# Patient Record
Sex: Female | Born: 1970
Health system: Southern US, Community
[De-identification: ages and names within clinical notes are randomized; demographics above are authoritative.]

## PROBLEM LIST (undated history)

## (undated) DIAGNOSIS — I10 Essential (primary) hypertension: Secondary | ICD-10-CM

## (undated) DIAGNOSIS — G4730 Sleep apnea, unspecified: Secondary | ICD-10-CM

## (undated) DIAGNOSIS — G510 Bell's palsy: Secondary | ICD-10-CM

## (undated) DIAGNOSIS — J45909 Unspecified asthma, uncomplicated: Secondary | ICD-10-CM

## (undated) DIAGNOSIS — T783XXA Angioneurotic edema, initial encounter: Secondary | ICD-10-CM

## (undated) DIAGNOSIS — B009 Herpesviral infection, unspecified: Secondary | ICD-10-CM

## (undated) DIAGNOSIS — Z86018 Personal history of other benign neoplasm: Secondary | ICD-10-CM

## (undated) DIAGNOSIS — E119 Type 2 diabetes mellitus without complications: Secondary | ICD-10-CM

## (undated) DIAGNOSIS — R2689 Other abnormalities of gait and mobility: Secondary | ICD-10-CM

## (undated) DIAGNOSIS — D649 Anemia, unspecified: Secondary | ICD-10-CM

## (undated) DIAGNOSIS — R011 Cardiac murmur, unspecified: Secondary | ICD-10-CM

## (undated) DIAGNOSIS — L508 Other urticaria: Secondary | ICD-10-CM

## (undated) DIAGNOSIS — F449 Dissociative and conversion disorder, unspecified: Secondary | ICD-10-CM

## (undated) DIAGNOSIS — J302 Other seasonal allergic rhinitis: Secondary | ICD-10-CM

## (undated) HISTORY — DX: Unspecified asthma, uncomplicated: J45.909

## (undated) HISTORY — PX: TOTAL ABDOMINAL HYSTERECTOMY: SHX209

## (undated) HISTORY — PX: LAPAROSCOPIC ABDOMINAL EXPLORATION: SHX6249

## (undated) HISTORY — PX: BREAST CYST EXCISION: SHX579

## (undated) HISTORY — PX: CYSTECTOMY: SUR359

## (undated) HISTORY — DX: Herpesviral infection, unspecified: B00.9

## (undated) HISTORY — DX: Cardiac murmur, unspecified: R01.1

## (undated) HISTORY — DX: Other seasonal allergic rhinitis: J30.2

## (undated) HISTORY — DX: Dissociative and conversion disorder, unspecified: F44.9

## (undated) HISTORY — DX: Other abnormalities of gait and mobility: R26.89

## (undated) HISTORY — DX: Personal history of other benign neoplasm: Z86.018

---
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
## 2012-07-01 DIAGNOSIS — K2950 Unspecified chronic gastritis without bleeding: Secondary | ICD-10-CM | POA: Insufficient documentation

## 2013-01-12 DIAGNOSIS — J45909 Unspecified asthma, uncomplicated: Secondary | ICD-10-CM | POA: Insufficient documentation

## 2013-01-12 DIAGNOSIS — K219 Gastro-esophageal reflux disease without esophagitis: Secondary | ICD-10-CM | POA: Insufficient documentation

## 2016-08-30 DIAGNOSIS — R011 Cardiac murmur, unspecified: Secondary | ICD-10-CM | POA: Insufficient documentation

## 2017-05-26 ENCOUNTER — Encounter (HOSPITAL_COMMUNITY): Payer: Self-pay

## 2017-05-26 ENCOUNTER — Emergency Department (HOSPITAL_COMMUNITY)
Admission: EM | Admit: 2017-05-26 | Discharge: 2017-05-27 | Disposition: A | Discharge status: Home / Self Care | Payer: Self-pay | Attending: Emergency Medicine | Admitting: Emergency Medicine

## 2017-05-26 DIAGNOSIS — R519 Headache, unspecified: Secondary | ICD-10-CM

## 2017-05-26 DIAGNOSIS — I10 Essential (primary) hypertension: Secondary | ICD-10-CM | POA: Insufficient documentation

## 2017-05-26 DIAGNOSIS — R51 Headache: Secondary | ICD-10-CM

## 2017-05-26 DIAGNOSIS — E119 Type 2 diabetes mellitus without complications: Secondary | ICD-10-CM | POA: Insufficient documentation

## 2017-05-26 HISTORY — DX: Anemia, unspecified: D64.9

## 2017-05-26 HISTORY — DX: Essential (primary) hypertension: I10

## 2017-05-26 HISTORY — DX: Type 2 diabetes mellitus without complications: E11.9

## 2017-05-26 LAB — CBG MONITORING, ED: Glucose-Capillary: 137 mg/dL — ABNORMAL HIGH (ref 65–99)

## 2017-05-26 MED ORDER — DIPHENHYDRAMINE HCL 50 MG/ML IJ SOLN
25.0000 mg | Freq: Once | INTRAMUSCULAR | Status: AC
  Administered 2017-05-27: 25 mg via INTRAVENOUS
  Filled 2017-05-26: qty 1

## 2017-05-26 MED ORDER — KETOROLAC TROMETHAMINE 30 MG/ML IJ SOLN
30.0000 mg | Freq: Once | INTRAMUSCULAR | Status: AC
  Administered 2017-05-27: 30 mg via INTRAVENOUS
  Filled 2017-05-26: qty 1

## 2017-05-26 MED ORDER — PROCHLORPERAZINE EDISYLATE 5 MG/ML IJ SOLN
10.0000 mg | Freq: Once | INTRAMUSCULAR | Status: AC
  Administered 2017-05-27: 10 mg via INTRAVENOUS
  Filled 2017-05-26: qty 2

## 2017-05-26 MED ORDER — SODIUM CHLORIDE 0.9 % IV BOLUS (SEPSIS)
1000.0000 mL | Freq: Once | INTRAVENOUS | Status: AC
Start: 2017-05-26 — End: 2017-05-27
  Administered 2017-05-27: 1000 mL via INTRAVENOUS

## 2017-05-26 NOTE — ED Provider Notes (Signed)
[facility]-EMERGENCY DEPT Provider Note   CSN: [id number] Arrival date & time: 05/26/17  1752     History   Chief Complaint No chief complaint on file.   HPI Shelley Mccoy is a 46 y.o. female.  HPI  46 y.o. female with a hx of DM, HTN, presents to the Emergency Department today due to frontal headache this afternoon. Pt took BP at work and found to be 164/98. Pt came home from work due to ongoing headache. Decided to come to ED for further evaluation. Pt states that she took her BP medications (Losartan/HCTZ). Does note hx migraines. OTC meds with minimal relief. Rates pain 8/10. Frontal headache. Throbbing and constant all day. Similar headache in past. Gradual onset. No N/V. No numbness/tingling. No fevers. No sinus congestion/URI symptoms. No unilateral weakness. Does endorse photophobia. No other symptoms noted.   Past Medical History:  Diagnosis Date  . Anemia   . Diabetes mellitus without complication (HCC)   . Hypertension     There are no active problems to display for this patient.   History reviewed. No pertinent surgical history.  OB History    No data available       Home Medications    Prior to Admission medications   Not on File    Family History No family history on file.  Social History Social History  Substance Use Topics  . Smoking status: Never Smoker  . Smokeless tobacco: Never Used  . Alcohol use Not on file     Allergies   Percocet [oxycodone-acetaminophen]   Review of Systems Review of Systems ROS reviewed and all are negative for acute change except as noted in the HPI.  Physical Exam Updated Vital Signs BP (!) 144/83 (BP Location: Right Arm)   Pulse 68   Temp 97.6 F (36.4 C) (Oral)   Resp 18   SpO2 100%   Physical Exam  Constitutional: She is oriented to person, place, and time. She appears well-developed and well-nourished. No distress.  HENT:  Head: Normocephalic and atraumatic.  Right Ear: Tympanic membrane, external  ear and ear canal normal.  Left Ear: Tympanic membrane, external ear and ear canal normal.  Nose: Nose normal.  Mouth/Throat: Uvula is midline, oropharynx is clear and moist and mucous membranes are normal. No trismus in the jaw. No oropharyngeal exudate, posterior oropharyngeal erythema or tonsillar abscesses.  Eyes: Pupils are equal, round, and reactive to light. EOM are normal.  Neck: Normal range of motion. Neck supple. No tracheal deviation present.  Cardiovascular: Normal rate, regular rhythm, S1 normal, S2 normal, normal heart sounds, intact distal pulses and normal pulses.   Pulmonary/Chest: Effort normal and breath sounds normal. No respiratory distress. She has no decreased breath sounds. She has no wheezes. She has no rhonchi. She has no rales.  Abdominal: Normal appearance and bowel sounds are normal. There is no tenderness.  Musculoskeletal: Normal range of motion.  Neurological: She is alert and oriented to person, place, and time. She has normal strength. No cranial nerve deficit or sensory deficit.  Cranial Nerves:  II: Pupils equal, round, reactive to light III,IV, VI: ptosis not present, extra-ocular motions intact bilaterally  V,VII: smile symmetric, facial light touch sensation equal VIII: hearing grossly normal bilaterally  IX,X: midline uvula rise  XI: bilateral shoulder shrug equal and strong XII: midline tongue extension  Skin: Skin is warm and dry.  Psychiatric: She has a normal mood and affect. Her speech is normal and behavior is normal. Thought content normal.  Nursing note and vitals reviewed.    ED Treatments / Results  Labs (all labs ordered are listed, but only abnormal results are displayed) Labs Reviewed  CBG MONITORING, ED - Abnormal; Notable for the following:       Result Value   Glucose-Capillary 137 (*)    All other components within normal limits    EKG  EKG Interpretation None       Radiology No results  found.  Procedures Procedures (including critical care time)  Medications Ordered in ED Medications  magnesium sulfate IVPB 1 g 100 mL (not administered)  sodium chloride 0.9 % bolus 1,000 mL (1,000 mLs Intravenous New Bag/Given 05/27/17 0037)  prochlorperazine (COMPAZINE) injection 10 mg (10 mg Intravenous Given 05/27/17 0037)  diphenhydrAMINE (BENADRYL) injection 25 mg (25 mg Intravenous Given 05/27/17 0036)  ketorolac (TORADOL) 30 MG/ML injection 30 mg (30 mg Intravenous Given 05/27/17 0036)    Initial Impression / Assessment and Plan / ED Course  I have reviewed the triage vital signs and the nursing notes.  Pertinent labs & imaging results that were available during my care of the patient were reviewed by me and considered in my medical decision making (see chart for details).  Final Clinical Impressions(s) / ED Diagnoses  {I have reviewed and evaluated the relevant laboratory values.   {I have reviewed the relevant previous healthcare records.  {I obtained HPI from historian.   ED Course:  Assessment: Patient is a 46 y.o. female with a hx of DM, HTN, presents to the Emergency Department today due to frontal headache this afternoon. Pt took BP at work and found to be 164/98. Pt came home from work due to ongoing headache. Decided to come to ED for further evaluation. Pt states that she took her BP medications (Losartan/HCTZ). Does note hx migraines. OTC meds with minimal relief. Rates pain 8/10. Frontal headache. Throbbing and constant all day. Similar headache in past. Gradual onset. No N/V. No numbness/tingling. No fevers. No sinus congestion/URI symptoms. No unilateral weakness. Does endorse photophobia.. Patient is without high-risk features of headache including: Sudden onset/thunderclap HA, No similar headache in past, Altered mental status, Accompanying seizure, Headache with exertion, Age > 50, History of immunocompromise, Neck or shoulder pain, Fever, Use of anticoagulation, Family  history of spontaneous SAH, Concomitant drug use, Toxic exposure.  Patient has a normal complete neurological exam, normal vital signs, normal level of consciousness, no signs of meningismus, is well-appearing/non-toxic appearing, no signs of trauma. No papilledema, no pain over the temporal arteries. Imaging with CT/MRI not indicated given history and physical exam findings. No dangerous or life-threatening conditions suspected or identified by history, physical exam, and by work-up. No indications for hospitalization identified. Headache likely 2/2 HTN. Given migraine cocktail as well as magnesium with improvement. Discussed diet and exercise. Close follow up to PCP for further management.   Disposition/Plan:  DC Home Additional Verbal discharge instructions given and discussed with patient.  Pt Instructed to f/u with PCP in the next week for evaluation and treatment of symptoms. Return precautions given Pt acknowledges and agrees with plan  Supervising Physician Ward, Layla Maw, DO  Final diagnoses:  Hypertension, unspecified type  Nonintractable headache, unspecified chronicity pattern, unspecified headache type    New Prescriptions New Prescriptions   No medications on file     Audry Pili, PA-C 05/27/17 0114    Ward, Layla Maw, DO 05/27/17 [phone]

## 2017-05-26 NOTE — ED Notes (Signed)
Called x 3 no answer 

## 2017-05-26 NOTE — Discharge Instructions (Signed)
Please read and follow all provided instructions.  Your diagnoses today include:  1. Hypertension, unspecified type   2. Nonintractable headache, unspecified chronicity pattern, unspecified headache type     Tests performed today include: Vital signs. See below for your results today.   Medications:  In the Emergency Department you received: Reglan - antinausea/headache medication Benadryl - antihistamine to counteract potential side effects of reglan Toradol - NSAID medication similar to ibuprofen  Take any prescribed medications only as directed.  Additional information:  Follow any educational materials contained in this packet.  You are having a headache. No specific cause was found today for your headache. It may have been a migraine or other cause of headache. Stress, anxiety, fatigue, and depression are common triggers for headaches.   Your headache today does not appear to be life-threatening or require hospitalization, but often the exact cause of headaches is not determined in the emergency department. Therefore, follow-up with your doctor is very important to find out what may have caused your headache and whether or not you need any further diagnostic testing or treatment.   Sometimes headaches can appear benign (not harmful), but then more serious symptoms can develop which should prompt an immediate re-evaluation by your doctor or the emergency department.  BE VERY CAREFUL not to take multiple medicines containing Tylenol (also called acetaminophen). Doing so can lead to an overdose which can damage your liver and cause liver failure and possibly death.   Follow-up instructions: Please follow-up with your primary care provider in the next 3 days for further evaluation of your symptoms.   Return instructions:  Please return to the Emergency Department if you experience worsening symptoms. Return if the medications do not resolve your headache, if it recurs, or if you  have multiple episodes of vomiting or cannot keep down fluids. Return if you have a change from the usual headache. RETURN IMMEDIATELY IF you: Develop a sudden, severe headache Develop confusion or become poorly responsive or faint Develop a fever above 100.85F or problem breathing Have a change in speech, vision, swallowing, or understanding Develop new weakness, numbness, tingling, incoordination in your arms or legs Have a seizure Please return if you have any other emergent concerns.  Additional Information:  Your vital signs today were: BP (!) 144/83 (BP Location: Right Arm)    Pulse 68    Temp 97.6 F (36.4 C) (Oral)    Resp 18    SpO2 100%  If your blood pressure (BP) was elevated above 135/85 this visit, please have this repeated by your doctor within one month. --------------

## 2017-05-26 NOTE — ED Triage Notes (Signed)
Patient complains of frontal headache and took her BP at work and found it to be 164/98. Came home and rested and ongoing headache. BP normal at triage. Did take her meds this am. Alert and oriented, NAD

## 2017-05-26 NOTE — ED Notes (Signed)
CBG 137 reported to J. Blue-Matthews RN

## 2017-05-27 MED ORDER — MAGNESIUM SULFATE IN D5W 1-5 GM/100ML-% IV SOLN
1.0000 g | Freq: Once | INTRAVENOUS | Status: AC
  Administered 2017-05-27: 1 g via INTRAVENOUS
  Filled 2017-05-27: qty 100

## 2017-07-05 ENCOUNTER — Encounter: Payer: Self-pay | Admitting: Obstetrics and Gynecology

## 2017-07-05 ENCOUNTER — Ambulatory Visit (INDEPENDENT_AMBULATORY_CARE_PROVIDER_SITE_OTHER): Payer: 59 | Admitting: Obstetrics and Gynecology

## 2017-07-05 VITALS — BP 122/81 | HR 73 | Ht <= 58 in | Wt 208.0 lb

## 2017-07-05 DIAGNOSIS — R102 Pelvic and perineal pain: Secondary | ICD-10-CM | POA: Diagnosis not present

## 2017-07-05 DIAGNOSIS — Z01419 Encounter for gynecological examination (general) (routine) without abnormal findings: Secondary | ICD-10-CM | POA: Diagnosis not present

## 2017-07-05 DIAGNOSIS — Z1151 Encounter for screening for human papillomavirus (HPV): Secondary | ICD-10-CM | POA: Diagnosis not present

## 2017-07-05 DIAGNOSIS — Z124 Encounter for screening for malignant neoplasm of cervix: Secondary | ICD-10-CM | POA: Diagnosis not present

## 2017-07-05 DIAGNOSIS — Z113 Encounter for screening for infections with a predominantly sexual mode of transmission: Secondary | ICD-10-CM | POA: Diagnosis not present

## 2017-07-05 MED ORDER — ESTRADIOL 0.1 MG/GM VA CREA: TOPICAL_CREAM | VAGINAL | 12 refills | Status: DC

## 2017-07-05 NOTE — Progress Notes (Signed)
Pt presents for annual and all STD blood work today.

## 2017-07-05 NOTE — Progress Notes (Signed)
Subjective:     Shelley Mccoy is a 46 y.o. female with BMI 45 who is here for a comprehensive physical exam. The patient reports no problems. She is transferring her care from another GYN office as she feels she is not able to have her questions answered. She denies any episodes of vaginal bleeding. She reports vaginal spotting last December for which she underwent an exploratory laparoscopy with lysis of adhesions. She reports a normal pap smear yearly and is under the impression that her uterus, both ovaries and cervix were removed in the 1990's for the treatment of symptomatic fibroid uterus. The December 2017 op reports comments on a normal vaginal cuff with a normal cervix into which a Hulka manipulator was inserted. She is sexually active with some vaginal dryness. She denies any urinary incontinence. She denies any abnormal discharge. She reports occasional pelvic discomfort  Past Medical History:  Diagnosis Date  . Anemia   . Asthma   . Diabetes mellitus without complication (HCC)   . Heart murmur   . HSV-2 (herpes simplex virus 2) infection   . Hypertension   . Seasonal allergies    Past Surgical History:  Procedure Laterality Date  . CYSTECTOMY     hand and breast  . LAPAROSCOPIC ABDOMINAL EXPLORATION    . TOTAL ABDOMINAL HYSTERECTOMY     Family History  Problem Relation Age of Onset  . Diabetes Mother   . Congestive Heart Failure Mother   . Kidney failure Mother   . Hypertension Mother   . Prostate cancer Father   . Hypertension Sister   . Diabetes Maternal Grandmother   . Congestive Heart Failure Maternal Grandmother   . Kidney failure Maternal Grandmother   . Alzheimer's disease Paternal Grandmother     Social History   Social History  . Marital status: Single    Spouse name: N/A  . Number of children: N/A  . Years of education: N/A   Occupational History  . Not on file.   Social History Main Topics  . Smoking status: Never Smoker  . Smokeless tobacco:  Never Used  . Alcohol use No  . Drug use: No  . Sexual activity: Not on file   Other Topics Concern  . Not on file   Social History Narrative  . No narrative on file   Health Maintenance  Topic Date Due  . HIV Screening  05/07/1984  . TETANUS/TDAP  05/07/1988  . PAP SMEAR  05/07/1990  . INFLUENZA VACCINE  04/28/2017       Review of Systems Pertinent items are noted in HPI.   Objective:  Blood pressure 122/81, pulse 73, height  (1.448 m), weight 208 lb (94.3 kg).     GENERAL: Well-developed, well-nourished female in no acute distress.  HEENT: Normocephalic, atraumatic. Sclerae anicteric.  NECK: Supple. Normal thyroid.  LUNGS: Clear to auscultation bilaterally.  HEART: Regular rate and rhythm. BREASTS: Symmetric in size. No palpable masses or lymphadenopathy, skin changes, or nipple drainage. ABDOMEN: Soft, nontender, nondistended. No organomegaly. PELVIC: Normal external female genitalia. Vagina is pink and rugated.  Normal discharge. Normal vaginal cuff. No adnexal mass or tenderness. EXTREMITIES: No cyanosis, clubbing, or edema, 2+ distal pulses.    Assessment:    Healthy female exam.      Plan:      Patient with normal mammogram on 10/20/2016 Advised patient to perform monthly self breast and vulva exam Vaginal pap smear collected per patient request Fasting labs obtained Pelvic ultrasound ordered to assess  the presence of any GYN organs and evaluate the pelvic discomfort. Discussed with patient that if ultrasound is normal, it could be related to pelvic adhesion given her history of significant pelvic adhesion noted on 08/2016 operative report Rx estrace cream provided. A sample was given at today's visit. Patient also advised to use water based lubricant during intercourse RTC prn See After Visit Summary for Counseling Recommendations

## 2017-07-06 LAB — COMPREHENSIVE METABOLIC PANEL
ALT: 36 IU/L — ABNORMAL HIGH (ref 0–32)
AST: 25 IU/L (ref 0–40)
Albumin/Globulin Ratio: 1.4 (ref 1.2–2.2)
Albumin: 4.1 g/dL (ref 3.5–5.5)
Alkaline Phosphatase: 126 IU/L — ABNORMAL HIGH (ref 39–117)
BUN/Creatinine Ratio: 17 (ref 9–23)
BUN: 13 mg/dL (ref 6–24)
Bilirubin Total: 0.8 mg/dL (ref 0.0–1.2)
CO2: 25 mmol/L (ref 20–29)
Calcium: 9.5 mg/dL (ref 8.7–10.2)
Chloride: 102 mmol/L (ref 96–106)
Creatinine, Ser: 0.75 mg/dL (ref 0.57–1.00)
GFR calc Af Amer: 109 mL/min/{1.73_m2} (ref >59)
GFR calc non Af Amer: 95 mL/min/{1.73_m2} (ref >59)
Globulin, Total: 3 g/dL (ref 1.5–4.5)
Glucose: 99 mg/dL (ref 65–99)
Potassium: 4.1 mmol/L (ref 3.5–5.2)
Sodium: 144 mmol/L (ref 134–144)
Total Protein: 7.1 g/dL (ref 6.0–8.5)

## 2017-07-06 LAB — CBC
Hematocrit: 37.7 % (ref 34.0–46.6)
Hemoglobin: 12.2 g/dL (ref 11.1–15.9)
MCH: 25.2 pg — ABNORMAL LOW (ref 26.6–33.0)
MCHC: 32.4 g/dL (ref 31.5–35.7)
MCV: 78 fL — ABNORMAL LOW (ref 79–97)
Platelets: 262 10*3/uL (ref 150–379)
RBC: 4.85 x10E6/uL (ref 3.77–5.28)
RDW: 16.5 % — ABNORMAL HIGH (ref 12.3–15.4)
WBC: 4.2 10*3/uL (ref 3.4–10.8)

## 2017-07-06 LAB — CYTOLOGY - PAP
Chlamydia: NEGATIVE
Diagnosis: NEGATIVE
HPV: NOT DETECTED
Neisseria Gonorrhea: NEGATIVE

## 2017-07-06 LAB — LIPID PANEL
Chol/HDL Ratio: 2.8 ratio (ref 0.0–4.4)
Cholesterol, Total: 152 mg/dL (ref 100–199)
HDL: 55 mg/dL (ref >39)
LDL Calculated: 82 mg/dL (ref 0–99)
Triglycerides: 76 mg/dL (ref 0–149)
VLDL Cholesterol Cal: 15 mg/dL (ref 5–40)

## 2017-07-06 LAB — HEMOGLOBIN A1C
Est. average glucose Bld gHb Est-mCnc: 131 mg/dL
Hgb A1c MFr Bld: 6.2 % — ABNORMAL HIGH (ref 4.8–5.6)

## 2017-07-06 LAB — TSH: TSH: 1.39 u[IU]/mL (ref 0.450–4.500)

## 2017-07-09 ENCOUNTER — Other Ambulatory Visit: Payer: 59

## 2017-07-15 ENCOUNTER — Other Ambulatory Visit: Payer: Self-pay | Admitting: Obstetrics and Gynecology

## 2017-07-15 ENCOUNTER — Other Ambulatory Visit: Payer: 59

## 2017-07-15 ENCOUNTER — Ambulatory Visit
Admission: RE | Admit: 2017-07-15 | Discharge: 2017-07-15 | Disposition: A | Discharge status: Home / Self Care | Payer: 59 | Source: Ambulatory Visit | Attending: Obstetrics and Gynecology | Admitting: Obstetrics and Gynecology

## 2017-07-15 DIAGNOSIS — Z01419 Encounter for gynecological examination (general) (routine) without abnormal findings: Secondary | ICD-10-CM

## 2017-07-15 DIAGNOSIS — R102 Pelvic and perineal pain: Secondary | ICD-10-CM

## 2017-07-19 ENCOUNTER — Other Ambulatory Visit: Payer: 59

## 2017-07-19 DIAGNOSIS — Z113 Encounter for screening for infections with a predominantly sexual mode of transmission: Secondary | ICD-10-CM

## 2017-07-20 ENCOUNTER — Telehealth: Payer: Self-pay

## 2017-07-20 LAB — HIV ANTIBODY (ROUTINE TESTING W REFLEX): HIV Screen 4th Generation wRfx: NONREACTIVE

## 2017-07-20 NOTE — Telephone Encounter (Signed)
-----   Message from Catalina AntiguaPeggy Constant, MD sent at 07/15/2017  3:35 PM EDT ----- Please inform patient that ultrasound does not demonstrate the presence of a uterus, cervix or ovaries.   My exam also did not reveal the presence of a cervix. The vaginal pap smear done on her past visit was normal. She does not need any more pap smear. She should continue to have an annual physical exam with mammogram  Thanks  Gigi GinPeggy

## 2017-07-20 NOTE — Telephone Encounter (Signed)
Pt aware the u/s does not demonstrate the presence of a UT, cx, or ovaries. Dr. Jolayne Pantheronstant did not see a cx. The vag pap smear completed on her past visit was normal and she does not need any more pap smears. However, pt should continue to have yearly annual exams and mgm. Pt agrees.

## 2017-09-16 ENCOUNTER — Encounter: Payer: Self-pay | Admitting: Obstetrics and Gynecology

## 2017-11-07 ENCOUNTER — Encounter (HOSPITAL_COMMUNITY): Payer: Self-pay

## 2017-11-07 ENCOUNTER — Inpatient Hospital Stay (HOSPITAL_COMMUNITY): Payer: Self-pay

## 2017-11-07 ENCOUNTER — Emergency Department (HOSPITAL_COMMUNITY): Payer: Self-pay

## 2017-11-07 ENCOUNTER — Inpatient Hospital Stay (HOSPITAL_COMMUNITY)
Admission: EM | Admit: 2017-11-07 | Discharge: 2017-11-09 | DRG: 880 | Disposition: A | Discharge status: Home / Self Care | Payer: Self-pay | Attending: Neurology | Admitting: Neurology

## 2017-11-07 ENCOUNTER — Other Ambulatory Visit: Payer: Self-pay

## 2017-11-07 DIAGNOSIS — E669 Obesity, unspecified: Secondary | ICD-10-CM | POA: Diagnosis present

## 2017-11-07 DIAGNOSIS — R471 Dysarthria and anarthria: Secondary | ICD-10-CM | POA: Diagnosis present

## 2017-11-07 DIAGNOSIS — R29810 Facial weakness: Secondary | ICD-10-CM | POA: Diagnosis present

## 2017-11-07 DIAGNOSIS — E785 Hyperlipidemia, unspecified: Secondary | ICD-10-CM | POA: Diagnosis present

## 2017-11-07 DIAGNOSIS — F444 Conversion disorder with motor symptom or deficit: Principal | ICD-10-CM | POA: Diagnosis present

## 2017-11-07 DIAGNOSIS — Z841 Family history of disorders of kidney and ureter: Secondary | ICD-10-CM

## 2017-11-07 DIAGNOSIS — D649 Anemia, unspecified: Secondary | ICD-10-CM | POA: Diagnosis present

## 2017-11-07 DIAGNOSIS — Z833 Family history of diabetes mellitus: Secondary | ICD-10-CM

## 2017-11-07 DIAGNOSIS — R011 Cardiac murmur, unspecified: Secondary | ICD-10-CM | POA: Diagnosis present

## 2017-11-07 DIAGNOSIS — Z7984 Long term (current) use of oral hypoglycemic drugs: Secondary | ICD-10-CM

## 2017-11-07 DIAGNOSIS — F419 Anxiety disorder, unspecified: Secondary | ICD-10-CM | POA: Diagnosis present

## 2017-11-07 DIAGNOSIS — Z885 Allergy status to narcotic agent status: Secondary | ICD-10-CM

## 2017-11-07 DIAGNOSIS — R2990 Unspecified symptoms and signs involving the nervous system: Secondary | ICD-10-CM

## 2017-11-07 DIAGNOSIS — Z8249 Family history of ischemic heart disease and other diseases of the circulatory system: Secondary | ICD-10-CM

## 2017-11-07 DIAGNOSIS — I10 Essential (primary) hypertension: Secondary | ICD-10-CM | POA: Diagnosis present

## 2017-11-07 DIAGNOSIS — E1151 Type 2 diabetes mellitus with diabetic peripheral angiopathy without gangrene: Secondary | ICD-10-CM | POA: Diagnosis present

## 2017-11-07 DIAGNOSIS — R29708 NIHSS score 8: Secondary | ICD-10-CM | POA: Diagnosis present

## 2017-11-07 DIAGNOSIS — Z6841 Body Mass Index (BMI) 40.0 and over, adult: Secondary | ICD-10-CM

## 2017-11-07 DIAGNOSIS — R4701 Aphasia: Secondary | ICD-10-CM | POA: Diagnosis present

## 2017-11-07 DIAGNOSIS — R29703 NIHSS score 3: Secondary | ICD-10-CM | POA: Diagnosis not present

## 2017-11-07 DIAGNOSIS — Z820 Family history of epilepsy and other diseases of the nervous system: Secondary | ICD-10-CM

## 2017-11-07 DIAGNOSIS — J45909 Unspecified asthma, uncomplicated: Secondary | ICD-10-CM | POA: Diagnosis present

## 2017-11-07 DIAGNOSIS — Z79899 Other long term (current) drug therapy: Secondary | ICD-10-CM

## 2017-11-07 DIAGNOSIS — I639 Cerebral infarction, unspecified: Secondary | ICD-10-CM | POA: Diagnosis present

## 2017-11-07 DIAGNOSIS — Z7951 Long term (current) use of inhaled steroids: Secondary | ICD-10-CM

## 2017-11-07 DIAGNOSIS — Z90710 Acquired absence of both cervix and uterus: Secondary | ICD-10-CM

## 2017-11-07 LAB — APTT: aPTT: 29 seconds (ref 24–36)

## 2017-11-07 LAB — I-STAT TROPONIN, ED: Troponin i, poc: 0 ng/mL (ref 0.00–0.08)

## 2017-11-07 LAB — COMPREHENSIVE METABOLIC PANEL
ALT: 29 U/L (ref 14–54)
AST: 24 U/L (ref 15–41)
Albumin: 4 g/dL (ref 3.5–5.0)
Alkaline Phosphatase: 103 U/L (ref 38–126)
Anion gap: 5 (ref 5–15)
BUN: 8 mg/dL (ref 6–20)
CO2: 28 mmol/L (ref 22–32)
Calcium: 8.9 mg/dL (ref 8.9–10.3)
Chloride: 109 mmol/L (ref 101–111)
Creatinine, Ser: 0.71 mg/dL (ref 0.44–1.00)
GFR calc Af Amer: >60 mL/min (ref >60)
GFR calc non Af Amer: >60 mL/min (ref >60)
Glucose, Bld: 67 mg/dL (ref 65–99)
Potassium: 3.4 mmol/L — ABNORMAL LOW (ref 3.5–5.1)
Sodium: 142 mmol/L (ref 135–145)
Total Bilirubin: 0.9 mg/dL (ref 0.3–1.2)
Total Protein: 7.6 g/dL (ref 6.5–8.1)

## 2017-11-07 LAB — DIFFERENTIAL
Basophils Absolute: 0 10*3/uL (ref 0.0–0.1)
Basophils Relative: 0 %
Eosinophils Absolute: 0.1 10*3/uL (ref 0.0–0.7)
Eosinophils Relative: 2 %
Lymphocytes Relative: 46 %
Lymphs Abs: 3 10*3/uL (ref 0.7–4.0)
Monocytes Absolute: 0.3 10*3/uL (ref 0.1–1.0)
Monocytes Relative: 5 %
Neutro Abs: 3.1 10*3/uL (ref 1.7–7.7)
Neutrophils Relative %: 47 %

## 2017-11-07 LAB — I-STAT CHEM 8, ED
BUN: 6 mg/dL (ref 6–20)
Calcium, Ion: 1.16 mmol/L (ref 1.15–1.40)
Chloride: 107 mmol/L (ref 101–111)
Creatinine, Ser: 0.7 mg/dL (ref 0.44–1.00)
Glucose, Bld: 63 mg/dL — ABNORMAL LOW (ref 65–99)
HCT: 35 % — ABNORMAL LOW (ref 36.0–46.0)
Hemoglobin: 11.9 g/dL — ABNORMAL LOW (ref 12.0–15.0)
Potassium: 3.3 mmol/L — ABNORMAL LOW (ref 3.5–5.1)
Sodium: 145 mmol/L (ref 135–145)
TCO2: 29 mmol/L (ref 22–32)

## 2017-11-07 LAB — PROTIME-INR
INR: 1
Prothrombin Time: 13.1 seconds (ref 11.4–15.2)

## 2017-11-07 LAB — GLUCOSE, CAPILLARY
Glucose-Capillary: 68 mg/dL (ref 65–99)
Glucose-Capillary: 103 mg/dL — ABNORMAL HIGH (ref 65–99)

## 2017-11-07 LAB — CBC
HCT: 34.6 % — ABNORMAL LOW (ref 36.0–46.0)
Hemoglobin: 11.6 g/dL — ABNORMAL LOW (ref 12.0–15.0)
MCH: 26 pg (ref 26.0–34.0)
MCHC: 33.5 g/dL (ref 30.0–36.0)
MCV: 77.6 fL — ABNORMAL LOW (ref 78.0–100.0)
Platelets: 290 10*3/uL (ref 150–400)
RBC: 4.46 MIL/uL (ref 3.87–5.11)
RDW: 15.3 % (ref 11.5–15.5)
WBC: 6.6 10*3/uL (ref 4.0–10.5)

## 2017-11-07 LAB — MRSA PCR SCREENING: MRSA by PCR: NEGATIVE

## 2017-11-07 LAB — I-STAT BETA HCG BLOOD, ED (MC, WL, AP ONLY): I-stat hCG, quantitative: <5 m[IU]/mL (ref <5)

## 2017-11-07 MED ORDER — DEXTROSE 50 % IV SOLN
INTRAVENOUS | Status: AC
  Administered 2017-11-07 – 2017-11-08 (×2): 25 mL
  Filled 2017-11-07: qty 50

## 2017-11-07 MED ORDER — SODIUM CHLORIDE 0.9 % IV SOLN
50.0000 mL/h | INTRAVENOUS | Status: DC
  Administered 2017-11-07: 50 mL/h via INTRAVENOUS

## 2017-11-07 MED ORDER — LABETALOL HCL 5 MG/ML IV SOLN: 10.0000 mg | Freq: Once | INTRAVENOUS | Status: DC | PRN

## 2017-11-07 MED ORDER — STROKE: EARLY STAGES OF RECOVERY BOOK
Freq: Once | Status: AC
  Administered 2017-11-07: 22:00:00
  Filled 2017-11-07 (×2): qty 1

## 2017-11-07 MED ORDER — NICARDIPINE HCL IN NACL 20-0.86 MG/200ML-% IV SOLN: 0.0000 mg/h | INTRAVENOUS | Status: DC

## 2017-11-07 MED ORDER — IOPAMIDOL (ISOVUE-370) INJECTION 76%
INTRAVENOUS | Status: AC
  Administered 2017-11-07: 100 mL
  Filled 2017-11-07: qty 100

## 2017-11-07 MED ORDER — PANTOPRAZOLE SODIUM 40 MG IV SOLR
40.0000 mg | Freq: Every day | INTRAVENOUS | Status: DC
  Administered 2017-11-07: 40 mg via INTRAVENOUS
  Filled 2017-11-07: qty 40

## 2017-11-07 MED ORDER — SODIUM CHLORIDE 0.9 % IJ SOLN
INTRAMUSCULAR | Status: AC
  Filled 2017-11-07: qty 50

## 2017-11-07 MED ORDER — ALTEPLASE (STROKE) FULL DOSE INFUSION
0.9000 mg/kg | Freq: Once | INTRAVENOUS | Status: AC
  Administered 2017-11-07: 86 mg via INTRAVENOUS
  Filled 2017-11-07: qty 100

## 2017-11-07 MED ORDER — SODIUM CHLORIDE 0.9 % IV SOLN: 50.0000 mL | Freq: Once | INTRAVENOUS | Status: AC

## 2017-11-07 MED ORDER — NICARDIPINE HCL IN NACL 20-0.86 MG/200ML-% IV SOLN: 0.0000 mg/h | INTRAVENOUS | Status: DC | PRN

## 2017-11-07 MED ORDER — LABETALOL HCL 5 MG/ML IV SOLN: 20.0000 mg | Freq: Once | INTRAVENOUS | Status: DC

## 2017-11-07 MED ORDER — METOCLOPRAMIDE HCL 5 MG/ML IJ SOLN
10.0000 mg | Freq: Once | INTRAMUSCULAR | Status: AC
  Administered 2017-11-07: 10 mg via INTRAVENOUS
  Filled 2017-11-07: qty 2

## 2017-11-07 NOTE — Consult Note (Addendum)
TeleNeurology Consult Note  Corinna Lines     Consulting Physician: Clabe Seal Code Status: No Order Primary Care Physician:  Patient, No Pcp Per  DOB:  02/19/1971   Age: 47 y.o.  Date of Service: November 07, 2017 Admit Date:  11/07/2017   Admitting Diagnosis: <principal problem not specified>  Subjective:  Reason for Consultation: STROKE  Chief Complaint: stuttering  History of present illness: 47 y/o woman presents with stuttering. Emergent telestroke consult requested. CT head reviewed and case discussed with ED staff. Patient able to write and follows commands. Case discussed with bedside local neurologist who also evaluated the patient at bedside.    Verbal Consent to tPA by her husband at 1444 I have explained to the patient/family the nature of the patient's condition, the use of tPA fibrinolytic agent, and the benefits to be reasonably expected compared with alternative approaches. I have discussed the likelihood of major risks or complications of this procedure including (if applicable) but not limited to loss of limb function, brain damage, paralysis, hemorrhage, infection, complications from transfusion of blood components, drug reactions, blood clots and loss of life. I have also indicated that with any procedure there is always the possibility of an unexpected complication. I have explained the risks which include:      Death, Stroke or permanent neurologic injury (paralysis, coma, etc). I have discussed the likelihood of major risks or complications of this procedure including (if applicable) but not limited to loss of limb function, brain damage, paralysis, hemorrhage, infection, complications from transfusion of blood components, drug reactions, blood clots and loss of life. I have also indicated that with any procedure there is always the possibility of an unexpected complication. I have the explain the risks which include:      Worsening of stroke symptoms from swelling  or bleeding in the brain      Bleeding in other parts of the body      Need for blood transfusions to replace blood or clotting factors      Allergic reaction to medications      Other unexpected complications      All questions were answered and the patient or family expressed understanding of the treatment plan and consent to the procedure.    Review of Systems  There are no active problems to display for this patient.  Past Medical History:  Diagnosis Date  . Anemia   . Asthma   . Diabetes mellitus without complication ([facility])   . Heart murmur   . History of uterine fibroid   . HSV-2 (herpes simplex virus 2) infection   . Hypertension   . Seasonal allergies    Past Surgical History:  Procedure Laterality Date  . CYSTECTOMY     hand and breast  . LAPAROSCOPIC ABDOMINAL EXPLORATION    . TOTAL ABDOMINAL HYSTERECTOMY     Allergies  Allergen Reactions  . Percocet [Oxycodone-Acetaminophen] Hives    Social History   Socioeconomic History  . Marital status: Single    Spouse name: Not on file  . Number of children: Not on file  . Years of education: Not on file  . Highest education level: Not on file  Social Needs  . Financial resource strain: Not on file  . Food insecurity - worry: Not on file  . Food insecurity - inability: Not on file  . Transportation needs - medical: Not on file  . Transportation needs - non-medical: Not on file  Occupational History  . Not on  file  Tobacco Use  . Smoking status: Never Smoker  . Smokeless tobacco: Never Used  Substance and Sexual Activity  . Alcohol use: No  . Drug use: No  . Sexual activity: Not on file  Other Topics Concern  . Not on file  Social History Narrative  . Not on file   Family History Family History  Problem Relation Age of Onset  . Diabetes Mother   . Congestive Heart Failure Mother   . Kidney failure Mother   . Hypertension Mother   . Prostate cancer Father   . Hypertension Sister   . Diabetes  Maternal Grandmother   . Congestive Heart Failure Maternal Grandmother   . Kidney failure Maternal Grandmother   . Alzheimer's disease Paternal Grandmother       Objective:  Vital signs in last 24 hours: Temp:  [98 F (36.7 C)] 98 F (36.7 C) (02/10 1417) Pulse Rate:  [89] 89 (02/10 1417) Resp:  [22] 22 (02/10 1417) BP: (154)/(84) 154/84 (02/10 1417) SpO2:  [100 %] 100 % (02/10 1417) Weight:  [95.4 kg (210 lb 4 oz)] 95.4 kg (210 lb 4 oz) (02/10 1428) Temp (24hrs), Avg:98 F (36.7 C), Min:98 F (36.7 C), Max:98 F (36.7 C)    Intake/Output last 3 shifts: No intake/output data recorded.  Intake/Output this shift: No intake/output data recorded.  Physical Exam 1a- LOC: Keenly responsive - 0      1b- LOC questions: Answers both questions incorrectly - 2   1c- LOC commands- Performs both tasks correctly- 0     2- Gaze: Normal; no gaze paresis or gaze deviation - 0     3- Visual Fields: normal, no Visual field deficit - 0     4- Facial movements: no facial palsy - 0     5a- Right Upper limb motor - no drift -0     5b -Left Upper limb motor - no drift -0     6a- Right Lower limb motor - no drift - 0      6b- Left Lower limb motor - no drift - 0 7- Limb Coordination: absent ataxia - 0      8- Sensory : no sensory loss - 0      9- Language - Mute - 3    10- Speech - [place] -3     11- Neglect / Extinction - none found -0     NIHSS score   8   Diagnostic Findings:  Pertinent Labs: No results for input(s): WBC, MCV in the last 168 hours.  Invalid input(s): HEMATOCRIT, HEMOGLOBIN, PLATELETS No results for input(s): POTASSIUM, CHLORIDE, CALCIUM, BUN, GLUCOSE, CREATININE, CO2 in the last 168 hours.  Invalid input(s): SODIUM, ANION GAP2, GFR MDRD AF AMER No results for input(s): AST, ALT, ALBUMIN in the last 168 hours.  Invalid input(s): ALK PHOS, BILIRUBIN TOTAL, BILIRUBIN DIRECT, PROTEIN TOTAL, GLOBULIN No results for input(s): TRIG in the last 168 hours.  Invalid  input(s): CHLPL, HDL PML, VLDL CALC, LDL CALC, CHOL/HDL RATIO, LDL/HDL RATIO, NON-HDL CHOLESTEROL Invalid input(s): CK TOTAL, TROPONIN I, CK MB No results for input(s): APTT in the last 168 hours. No results for input(s): INR in the last 168 hours.  Invalid input(s): PROTHROMBIN TIME  Extensive review of previous medical records completed.    Assessment: There are no active problems to display for this patient.    Plan: [facility] Tele[facility]urology Consult Services  Impression: Stroke     Differential Diagnosis:  1. Cardioembolic stroke  2. Small  vessel disease/lacune    3. Thromboembolic, artery-to-artery mechanism 4. Hypercoagulable state-related infarct  5. Thrombotic mechanism, large artery disease 6. Transient ischemic attack 7. Conversion disorder  Comments: Last known well:   1200 Door time:1401 [facility] contacted:   1430 [facility] at bedside:   1436 NIHSS assessment time:1436 TPA ordered at 1446 Needle time:1501     Discussion:     Our recommendations are outlined below.  Recommendations: Check CTA head/neck for possible thrombectomy (perfusion imaging not available) Admit to ICU s/p TPA for neurochecks, BP control and repeat CT head in 24 hours Neurochecks DVT prophylaxis    Follow up with Neurology for further testing and evaluation  Medical Decision Making:  - Extensive number of diagnosis or management options are considered above. - Extensive amount of complex data reviewed.  - High risk of complication and/or morbidity or mortality are associated with differential diagnostic considerations above. - There may be uncertain outcome and increased probability of prolonged functional impairment or high probability of severe prolonged functional impairment associated with some of these differential diagnosis.  Medical Data Reviewed: 1.Data reviewed include clinical labs, radiology, Medical Tests; 2.Tests results discussed w/performing or  interpreting physician;    3.Obtaining/reviewing old medical records; 4.Obtaining case history from another source;    5.Independent review of image, tracing or specimen.  Signed: 11/07/2017  2:39 PM   Addendum 1550  No large vessel intracranial occlusion as per my review. Not a thrombctomy candidate at this time. Case discussed with ED staff.

## 2017-11-07 NOTE — Progress Notes (Addendum)
   11/07/17 1615  T-PA Assessment Vital Signs  Pulse Rate 85  ECG Heart Rate 85  BP (!) 120/96  SpO2 99 %  Modified NIH Stroke Scale  Interval Shift assessment  Level of Consciousness (1a.)    0  LOC Questions (1b. )   + 0  LOC Commands (1c. )   +  0  Best Gaze (2. )  + 0  Visual (3. )  + 0  Facial Palsy (4. )     1 (right)  Motor Arm, Left (5a. )   + 0  Motor Arm, Right (5b. )   + 0  Motor Leg, Left (6a. )   + 0  Motor Leg, Right (6b. )   + 3  Limb Ataxia (7. ) 0  Sensory (8. )   + 1  Best Language (9. )   + 2  Dysarthria (10. ) 2  Extinction/Inattention (11.)   + 0  Total 9    Pt arrived to 4N31 at 1614.  Pt A&O x 4 (through written communication only-pt presents with persistent stutter and unintelligible speech, although clearly able to understand speech and follow commands) , c/o mild headache, Pt mildly anxious, Family at the bedside.  MD at bedside.

## 2017-11-07 NOTE — Progress Notes (Signed)
Pt now able to speak, and reporting that she has a history of Bell's palsy and this is how she presented previously as well.  Information passed along to night shift.

## 2017-11-07 NOTE — ED Notes (Addendum)
[facility] arrived  

## 2017-11-07 NOTE — ED Notes (Signed)
Pharmacy has been contacted and they are tubing up TpA ASAP.

## 2017-11-07 NOTE — Progress Notes (Signed)
Pharmacist Code Stroke Response  Notified to mix tPA at 14:48 by RN Barham Delivered tPA to RN at 14:56  Issues/delays encountered (if applicable):   Per epic note RN states code stroke called at 14:25 RN called pharmacy giving weight and height at 14:48 ED secretary called pharmacy at 14:48 stating needing tPA in [facility] Code stroke pager went off at 14:59   Arley Phenixllen Britt Petroni RPh 11/07/2017, 3:30 PM Pager [phone]

## 2017-11-07 NOTE — ED Triage Notes (Signed)
Pt presents with stuttering, R sided facial droop and aphasia. Code stroke called. MD made aware. Headed to CT.

## 2017-11-07 NOTE — ED Notes (Signed)
[facility] left with patient 

## 2017-11-07 NOTE — H&P (Signed)
Neurology H&P  CC: Speech abnormality  History is obtained from: Patient  HPI: Shelley Mccoy is a 47 y.o. female with a history of diabetes, hypertension who presents with speech abnormality that started noon.  He apparently worked this morning, then began having difficulty speaking and moving right side of her face.  She was evaluated by tele-neurology who felt that she was a candidate for IV TPA and therefore started it.   LKW: Noon tpa given?: yes Modified Rankin Scale: 0-Completely asymptomatic and back to baseline post- stroke  ROS:  Unable to obtain due to altered mental status.   Past Medical History:  Diagnosis Date  . Anemia   . Asthma   . Diabetes mellitus without complication (HCC)   . Heart murmur   . History of uterine fibroid   . HSV-2 (herpes simplex virus 2) infection   . Hypertension   . Seasonal allergies      Family History  Problem Relation Age of Onset  . Diabetes Mother   . Congestive Heart Failure Mother   . Kidney failure Mother   . Hypertension Mother   . Prostate cancer Father   . Hypertension Sister   . Diabetes Maternal Grandmother   . Congestive Heart Failure Maternal Grandmother   . Kidney failure Maternal Grandmother   . Alzheimer's disease Paternal Grandmother      Social History:  reports that  has never smoked. she has never used smokeless tobacco. She reports that she does not drink alcohol or use drugs.   Exam: Current vital signs: BP 133/87   Pulse 78   Temp 98 F (36.7 C) (Oral)   Resp (!) 22   Ht 5' (1.524 m)   Wt 95.4 kg (210 lb 4 oz)   SpO2 100%   BMI 41.06 kg/m  Vital signs in last 24 hours: Temp:  [98 F (36.7 C)] 98 F (36.7 C) (02/10 1417) Pulse Rate:  [71-95] 78 (02/10 1900) Resp:  [7-36] 22 (02/10 1900) BP: (120-167)/(74-96) 133/87 (02/10 1900) SpO2:  [95 %-100 %] 100 % (02/10 1900) Weight:  [95.4 kg (210 lb 4 oz)] 95.4 kg (210 lb 4 oz) (02/10 1428)  Physical Exam  Constitutional: Appears  well-developed and well-nourished.  Psych: Affect appropriate to situation Eyes: No scleral injection HENT: No OP obstrucion Head: Normocephalic.  Cardiovascular: Normal rate and regular rhythm.  Respiratory: Effort normal and breath sounds normal to anterior ascultation GI: Soft.  No distension. There is no tenderness.  Skin: WDI  Neuro: Mental Status: Patient is awake, alert, she has an abnormal speech pattern of stuttering, when I asked her to calm down and slowly repeat words, she does repeat one word at a time.  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 Cranial Nerves: II: Visual Fields are full. Pupils are equal, round, and reactive to light.   III,IV, VI: EOMI without ptosis or diploplia.  V: Facial sensation is symmetric to temperature VII: She holds her right face flexed with her lips pursed towards the direction of her face that she will not move. VIII: hearing is intact to voice X: Uvula elevates symmetrically XI: Shoulder shrug is symmetric. XII: tongue is midline without atrophy or fasciculations.  Motor: Tone is normal. Bulk is normal. 5/5 strength was present in all four extremities.  Sensory: Sensation is symmetric to light touch and temperature in the arms and legs. Cerebellar: FNF and HKS are intact bilaterally  I have reviewed labs in epic and the results pertinent to this consultation are: CMP-unremarkable  I  have reviewed the images obtained: CT/CTA-negative  Primary Diagnosis:  Aphasia   Secondary Diagnosis: Type 2 diabetes mellitus w/o complications   Impression: 47 year old female with50 multiple stroke risk factors who presents with new onset difficulty speaking.  In the setting IV TPA was started which is very reasonable, however I do have some suspicion that may be conversion disorder.  Recommendations: 1. HgbA1c, fasting lipid panel 2. MRI  of the brain without contrast 3. Frequent neuro checks 4. Echocardiogram 5. Carotid dopplers are not needed  given CTA 6. Prophylactic therapy-none for 24 hours 7. Risk factor modification 8. Telemetry monitoring 9. PT consult, OT consult, Speech consult 10. please page stroke NP  Or  PA  Or MD  from 8am -4 pm as this patient will be followed by the stroke team at this point.   You can look them up on [email or web address]     Ritta SlotMcNeill Karsynn Deweese, MD [facility] [phone]  If 7pm- 7am, please page neurology on call as listed in AMION. 11/07/2017  7:42 PM

## 2017-11-07 NOTE — ED Provider Notes (Signed)
[facility]-EMERGENCY DEPT Provider Note   CSN: [id number] Arrival date & time: 11/07/17  1401     History   Chief Complaint Chief Complaint  Patient presents with  . Stroke Symptoms    HPI Shelley Mccoy is a 47 y.o. female.  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  This occurred near noon.  Her husband brought her to the emergency department okay neuro bed sounds good need ICU if it that way   The history is provided by a relative. No language interpreter was used.  Altered Mental Status   This is a new problem. The current episode started 1 to 2 hours ago. The problem has not changed since onset.Associated symptoms include confusion. Risk factors: Unknown. Her past medical history does not include seizures.    Past Medical History:  Diagnosis Date  . Anemia   . Asthma   . Diabetes mellitus without complication (HCC)   . Heart murmur   . History of uterine fibroid   . HSV-2 (herpes simplex virus 2) infection   . Hypertension   . Seasonal allergies     Patient Active Problem List   Diagnosis Date Noted  . 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 CYSTECTOMY     hand and breast  . LAPAROSCOPIC ABDOMINAL EXPLORATION    . TOTAL ABDOMINAL HYSTERECTOMY      OB History    Gravida Para Term Preterm AB Living   4 3 3   1 3    SAB TAB Ectopic Multiple Live Births   1       3       Home Medications    Prior to Admission medications   Medication Sig Start Date End Date Taking? Authorizing Provider  gabapentin (NEURONTIN) 300 MG capsule Take 300 mg by mouth 3 (three) times daily.   Yes [provider]  glipiZIDE (GLUCOTROL XL) 10 MG 24 hr tablet Take 10 mg by mouth daily with breakfast.   Yes [provider]  pravastatin (PRAVACHOL) 20 MG tablet Take 20 mg by mouth daily.   Yes [provider]  valACYclovir (VALTREX)  1000 MG tablet Take 1,000 mg by mouth 2 (two) times daily.   Yes [provider]  verapamil (CALAN) 40 MG tablet Take 40 mg by mouth daily.   Yes [provider]  albuterol (VENTOLIN HFA) 108 (90 Base) MCG/ACT inhaler Inhale into the lungs every 6 (six) hours as needed for wheezing or shortness of breath.    [provider]  estradiol (ESTRACE) 0.1 MG/GM vaginal cream Apply 1 gram per vagina every night for 2 weeks, then apply three times a week 07/05/17   Constant, Peggy, MD  fexofenadine (ALLER-EASE) 180 MG tablet Take 180 mg by mouth daily.    [provider]  fluticasone-salmeterol (ADVAIR HFA) 308-65 MCG/ACT inhaler Daily. 05/20/12   [provider]  losartan-hydrochlorothiazide (HYZAAR) 50-12.5 MG tablet Take 1 tablet by mouth daily.    [provider]    Family History Family History  Problem Relation Age of Onset  . Diabetes Mother   . Congestive Heart Failure Mother   . Kidney failure Mother   . Hypertension Mother   . Prostate cancer Father   . Hypertension Sister   . Diabetes Maternal Grandmother   . Congestive Heart Failure Maternal Grandmother   . Kidney failure Maternal Grandmother   . Alzheimer's  disease Paternal Grandmother     Social History Social History   Tobacco Use  . Smoking status: Never Smoker  . Smokeless tobacco: Never Used  Substance Use Topics  . Alcohol use: No  . Drug use: No     Allergies   Percocet [oxycodone-acetaminophen]   Review of Systems Review of Systems  Unable to perform ROS: Mental status change  Psychiatric/Behavioral: Positive for confusion.     Physical Exam Updated Vital Signs BP (!) 167/89   Pulse 89   Temp 98 F (36.7 C) (Oral)   Resp (!) 22   Ht 5' (1.524 m)   Wt 95.4 kg (210 lb 4 oz)   SpO2 100%   BMI 41.06 kg/m   Physical Exam  Constitutional: She appears well-developed.  HENT:  Head: Normocephalic.  Eyes: Conjunctivae and EOM are normal. No scleral  icterus.  Neck: Neck supple. No thyromegaly present.  Cardiovascular: Normal rate and regular rhythm. Exam reveals no gallop and no friction rub.  No murmur heard. Pulmonary/Chest: No stridor. She has no wheezes. She has no rales. She exhibits no tenderness.  Abdominal: She exhibits no distension. There is no tenderness. There is no rebound.  Musculoskeletal: Normal range of motion. She exhibits no edema.  Lymphadenopathy:    She has no cervical adenopathy.  Neurological: She is alert. She exhibits normal muscle tone. Coordination normal.  Patient has slurred speech.  Skin: No rash noted. No erythema.  Psychiatric: She has a normal mood and affect. Her behavior is normal.     ED Treatments / Results  Labs (all labs ordered are listed, but only abnormal results are displayed) Labs Reviewed  CBC - Abnormal; Notable for the following components:      Result Value   Hemoglobin 11.6 (*)    HCT 34.6 (*)    MCV 77.6 (*)    All other components within normal limits  COMPREHENSIVE METABOLIC PANEL - Abnormal; Notable for the following components:   Potassium 3.4 (*)    All other components within normal limits  I-STAT CHEM 8, ED - Abnormal; Notable for the following components:   Potassium 3.3 (*)    Glucose, Bld 63 (*)    Hemoglobin 11.9 (*)    HCT 35.0 (*)    All other components within normal limits  PROTIME-INR  APTT  DIFFERENTIAL  I-STAT TROPONIN, ED  I-STAT BETA HCG BLOOD, ED ([facility], [facility], [facility] ONLY)  CBG MONITORING, ED    EKG  EKG Interpretation None       Radiology Ct Head Code Stroke Wo Contrast  Result Date: 11/07/2017 CLINICAL DATA:  Code stroke. Sudden onset aphasia today. Symptoms began at 12. EXAM: CT HEAD WITHOUT CONTRAST TECHNIQUE: Contiguous axial images were obtained from the base of the skull through the vertex without intravenous contrast. COMPARISON:  None. FINDINGS: Brain: No evidence of acute infarction, hemorrhage, hydrocephalus, extra-axial collection or  mass lesion/mass effect. Incidental scattered dural calcifications. Normal brain volume. Vascular: No hyperdense vessel or unexpected calcification. Skull: Normal. Negative for fracture or focal lesion. Sinuses/Orbits: Negative Other: These results were called by telephone at the time of interpretation on 11/07/2017 at 2:42 pm to Dr. Lorre NickANTHONY ALLEN , who verbally acknowledged these results. ASPECTS [facility]([place] Stroke Program Early CT Score) - Ganglionic level infarction (caudate, lentiform nuclei, internal capsule, insula, M1-M3 cortex): 7 - Supraganglionic infarction (M4-M6 cortex): 3 Total score (0-10 with 10 being normal): 10 IMPRESSION: Negative head CT.  ASPECTS is 10. Electronically Signed   By: Marja KaysJonathon  Watts M.D.   On: 11/07/2017 14:42    Procedures Procedures (including critical care time)  Medications Ordered in ED Medications  alteplase (ACTIVASE) 1 mg/mL infusion 86 mg (86 mg Intravenous New Bag/Given 11/07/17 1501)    Followed by  0.9 %  sodium chloride infusion (not administered)  nicardipine (CARDENE) 20mg  in 0.86% saline IV infusion (0.1 mg/ml) (not administered)  sodium chloride 0.9 % injection (not administered)   stroke: mapping our early stages of recovery book (not administered)  0.9 %  sodium chloride infusion (not administered)  labetalol (NORMODYNE,TRANDATE) injection 10 mg (not administered)    And  nicardipine (CARDENE) 20mg  in 0.86% saline IV infusion (0.1 mg/ml) (not administered)  pantoprazole (PROTONIX) injection 40 mg (not administered)  iopamidol (ISOVUE-370) 76 % injection (100 mLs  Contrast Given 11/07/17 1519)     Initial Impression / Assessment and Plan / ED Course  I have reviewed the triage vital signs and the nursing notes.  Pertinent labs & imaging results that were available during my care of the patient were reviewed by me and considered in my medical decision making (see chart for details).     CRITICAL CARE Performed by: Bethann Berkshire Total critical care time: 35 minutes Critical care time was exclusive of separately billable procedures and treating other patients. Critical care was necessary to treat or prevent imminent or life-threatening deterioration. Critical care was time spent personally by me on the following activities: development of treatment plan with patient and/or surrogate as well as nursing, discussions with consultants, evaluation of patient's response to treatment, examination of patient, obtaining history from patient or surrogate, ordering and performing treatments and interventions, ordering and review of laboratory studies, ordering and review of radiographic studies, pulse oximetry and re-evaluation of patient's condition.  Patient was considered a code stroke and was seen by telemetry neurology and it was decided to start TPA on the patient.  Dr. Petra Kuba was also here seeing another patient and examined the patient and arrange for admission to neuro ICU at [facility] Final Clinical Impressions(s) / ED Diagnoses   Final diagnoses:  Stroke-like symptoms    ED Discharge Orders    None       Bethann Berkshire, MD 11/07/17 1539

## 2017-11-08 ENCOUNTER — Inpatient Hospital Stay (HOSPITAL_COMMUNITY): Payer: Self-pay

## 2017-11-08 ENCOUNTER — Other Ambulatory Visit (HOSPITAL_COMMUNITY): Payer: 59

## 2017-11-08 DIAGNOSIS — I10 Essential (primary) hypertension: Secondary | ICD-10-CM

## 2017-11-08 DIAGNOSIS — E119 Type 2 diabetes mellitus without complications: Secondary | ICD-10-CM

## 2017-11-08 DIAGNOSIS — R0989 Other specified symptoms and signs involving the circulatory and respiratory systems: Secondary | ICD-10-CM

## 2017-11-08 LAB — CBC
HCT: 36.5 % (ref 36.0–46.0)
Hemoglobin: 11.7 g/dL — ABNORMAL LOW (ref 12.0–15.0)
MCH: 25.3 pg — ABNORMAL LOW (ref 26.0–34.0)
MCHC: 32.1 g/dL (ref 30.0–36.0)
MCV: 78.8 fL (ref 78.0–100.0)
Platelets: 266 10*3/uL (ref 150–400)
RBC: 4.63 MIL/uL (ref 3.87–5.11)
RDW: 15.4 % (ref 11.5–15.5)
WBC: 5.6 10*3/uL (ref 4.0–10.5)

## 2017-11-08 LAB — LIPID PANEL
Cholesterol: 123 mg/dL (ref 0–200)
HDL: 45 mg/dL (ref >40)
LDL Cholesterol: 69 mg/dL (ref 0–99)
Total CHOL/HDL Ratio: 2.7 RATIO
Triglycerides: 47 mg/dL (ref <150)
VLDL: 9 mg/dL (ref 0–40)

## 2017-11-08 LAB — RENAL FUNCTION PANEL
Albumin: 3.3 g/dL — ABNORMAL LOW (ref 3.5–5.0)
Anion gap: 10 (ref 5–15)
BUN: <5 mg/dL — ABNORMAL LOW (ref 6–20)
CO2: 24 mmol/L (ref 22–32)
Calcium: 8.6 mg/dL — ABNORMAL LOW (ref 8.9–10.3)
Chloride: 109 mmol/L (ref 101–111)
Creatinine, Ser: 0.67 mg/dL (ref 0.44–1.00)
GFR calc Af Amer: >60 mL/min (ref >60)
GFR calc non Af Amer: >60 mL/min (ref >60)
Glucose, Bld: 77 mg/dL (ref 65–99)
Phosphorus: 3.5 mg/dL (ref 2.5–4.6)
Potassium: 3.6 mmol/L (ref 3.5–5.1)
Sodium: 143 mmol/L (ref 135–145)

## 2017-11-08 LAB — RAPID URINE DRUG SCREEN, HOSP PERFORMED
Amphetamines: NOT DETECTED
Barbiturates: NOT DETECTED
Benzodiazepines: NOT DETECTED
Cocaine: NOT DETECTED
Opiates: NOT DETECTED
Tetrahydrocannabinol: NOT DETECTED

## 2017-11-08 LAB — HEMOGLOBIN A1C
Hgb A1c MFr Bld: 6.4 % — ABNORMAL HIGH (ref 4.8–5.6)
Mean Plasma Glucose: 136.98 mg/dL

## 2017-11-08 LAB — GLUCOSE, CAPILLARY
Glucose-Capillary: 69 mg/dL (ref 65–99)
Glucose-Capillary: 79 mg/dL (ref 65–99)
Glucose-Capillary: 126 mg/dL — ABNORMAL HIGH (ref 65–99)

## 2017-11-08 MED ORDER — MOMETASONE FURO-FORMOTEROL FUM 200-5 MCG/ACT IN AERO
2.0000 | INHALATION_SPRAY | Freq: Two times a day (BID) | RESPIRATORY_TRACT | Status: DC
  Administered 2017-11-09: 2 via RESPIRATORY_TRACT
  Filled 2017-11-08 (×2): qty 8.8

## 2017-11-08 MED ORDER — VALACYCLOVIR HCL 500 MG PO TABS
1000.0000 mg | ORAL_TABLET | Freq: Two times a day (BID) | ORAL | Status: DC
  Administered 2017-11-08 – 2017-11-09 (×2): 1000 mg via ORAL
  Filled 2017-11-08 (×3): qty 2

## 2017-11-08 MED ORDER — PANTOPRAZOLE SODIUM 40 MG PO TBEC
40.0000 mg | DELAYED_RELEASE_TABLET | Freq: Every day | ORAL | Status: DC
  Administered 2017-11-08 – 2017-11-09 (×2): 40 mg via ORAL
  Filled 2017-11-08 (×2): qty 1

## 2017-11-08 MED ORDER — LORATADINE 10 MG PO TABS
10.0000 mg | ORAL_TABLET | Freq: Every day | ORAL | Status: DC
  Administered 2017-11-09: 10 mg via ORAL
  Filled 2017-11-08: qty 1

## 2017-11-08 MED ORDER — PRAVASTATIN SODIUM 20 MG PO TABS
20.0000 mg | ORAL_TABLET | Freq: Every day | ORAL | Status: DC
  Administered 2017-11-08: 20 mg via ORAL
  Filled 2017-11-08: qty 1

## 2017-11-08 MED ORDER — GLIPIZIDE ER 10 MG PO TB24: 10.0000 mg | ORAL_TABLET | Freq: Every day | ORAL | Status: DC

## 2017-11-08 MED ORDER — GABAPENTIN 300 MG PO CAPS
300.0000 mg | ORAL_CAPSULE | Freq: Three times a day (TID) | ORAL | Status: DC
  Administered 2017-11-08 – 2017-11-09 (×2): 300 mg via ORAL
  Filled 2017-11-08 (×2): qty 1

## 2017-11-08 MED ORDER — ALBUTEROL SULFATE (2.5 MG/3ML) 0.083% IN NEBU: 3.0000 mL | INHALATION_SOLUTION | Freq: Four times a day (QID) | RESPIRATORY_TRACT | Status: DC | PRN

## 2017-11-08 NOTE — Progress Notes (Signed)
PT Cancellation Note  Patient Details Name: Shelley Mccoy MRN: [id number] DOB: 27-Jun-1971   Cancelled Treatment:    Reason Eval/Treat Not Completed: Patient at procedure or test/unavailable(at MRI will follow)   Fabio AsaDevon J Elyanah Farino 11/08/2017, 3:11 PM

## 2017-11-08 NOTE — Evaluation (Signed)
Occupational Therapy Evaluation Patient Details Name: Shelley Mccoy MRN: [id number] DOB: 04-21-1971 Today's Date: 11/08/2017    History of Present Illness 47 y.o. female with a history of diabetes, hypertension who presents with speech abnormality and given IV tPA on 11/07/17. PMH including Bell's palsy (per pt report), diabetes, HTN, and heart murmur. MRI pending.    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 Pt currently performing ADLs and functional mobility at supervision level. Pt with slight weakness in R grasp and slow finger opposition. Pt presenting with child-like behaviors and slow processing. Pt would benefit from further acute OT to facilitate safe dc. Recommend dc with follow up at Neuro OP OT to optimize return to PLOF.     Follow Up Recommendations  Outpatient OT(Neuro OP OT)    Equipment Recommendations  None recommended by OT    Recommendations for Other Services PT consult;Speech consult     Precautions / Restrictions Precautions Precautions: Fall Restrictions Weight Bearing Restrictions: No      Mobility Bed Mobility Overal bed mobility: Modified Independent             General bed mobility comments: Increased time  Transfers Overall transfer level: Needs assistance Equipment used: None Transfers: Sit to/from Stand Sit to Stand: Supervision         General transfer comment: supervision for safety    Balance Overall balance assessment: Needs assistance Sitting-balance support: No upper extremity supported;Feet supported Sitting balance-Leahy Scale: Fair     Standing balance support: No upper extremity supported;During functional activity Standing balance-Leahy Scale: Fair                             ADL either performed or assessed with clinical judgement   ADL Overall ADL's : Needs assistance/impaired Eating/Feeding: Set up;Sitting    Grooming: Set up;Supervision/safety;Wash/dry hands;Sitting   Upper Body Bathing: Set up;Supervision/ safety;Sitting   Lower Body Bathing: Set up;Supervison/ safety;Sit to/from stand   Upper Body Dressing : Set up;Supervision/safety;Sitting   Lower Body Dressing: Set up;Supervision/safety;Sit to/from stand   Toilet Transfer: Set up;Supervision/safety;Ambulation;Regular Toilet   Toileting- Clothing Manipulation and Hygiene: Set up;Supervision/safety;Sit to/from stand       Functional mobility during ADLs: Supervision/safety(close supervision) General ADL Comments: Pt performing ADLs and funcitonal mobility at supervision level. Pt demonstrating near baseline funciton. However, processing more slowly and very cauteous in movemonts. Pt perseverating on certain words or topics and asking for reassurance throughout session. Presenting with child like behaviors and asking for reassurance throughout session despite clear cues.      Vision Baseline Vision/History: Wears glasses Wears Glasses: At all times Patient Visual Report: No change from baseline Additional Comments: Further assess     Perception     Praxis      Pertinent Vitals/Pain Pain Assessment: No/denies pain     Hand Dominance Right   Extremity/Trunk Assessment Upper Extremity Assessment Upper Extremity Assessment: RUE deficits/detail RUE Deficits / Details: Pt with slight weakness in grasp and requires increased time for finger opposition.  RUE Coordination: decreased fine motor   Lower Extremity Assessment Lower Extremity Assessment: Defer to PT evaluation   Cervical / Trunk Assessment Cervical / Trunk Assessment: Other exceptions Cervical / Trunk Exceptions: Increased body habitus   Communication Communication Communication: Expressive difficulties   Cognition Arousal/Alertness: Awake/alert Behavior During Therapy: WFL for tasks assessed/performed Overall Cognitive Status: Impaired/Different from  baseline Area of Impairment: Memory;Problem solving                     Memory: Decreased short-term memory       Problem Solving: Slow processing;Requires verbal cues General Comments: Slow processing and requires increased time throughout.    General Comments  significant other present throughout session    Exercises     Shoulder Instructions      Home Living Family/patient expects to be discharged to:: Private residence Living Arrangements: Spouse/significant other Available Help at Discharge: Family;Available PRN/intermittently(Significant other works at [employer]) Type of Home: Apartment(2nd) Home Access: Stairs to enter [employer] of Steps: flight   Home Layout: One level     Bathroom Shower/Tub: IT trainer: Standard     Home Equipment: None          Prior Functioning/Environment Level of Independence: Independent        Comments: ADLs, IADLs, driving, 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 Problem List: Decreased activity tolerance;Impaired balance (sitting and/or standing);Decreased cognition;Decreased safety awareness      OT Treatment/Interventions: Self-care/ADL training;Therapeutic exercise;Energy conservation;DME and/or AE instruction;Therapeutic activities;Patient/family education    OT Goals(Current goals can be found in the care plan section) Acute Rehab OT Goals Patient Stated Goal: Go back to work OT Goal Formulation: With patient Time For Goal Achievement: 11/22/17 Potential to Achieve Goals: Good ADL Goals Pt Will Perform Grooming: with modified independence;standing Pt Will Perform Lower Body Dressing: with modified independence;sit to/from stand Additional ADL Goal #1: Pt will collect all needed ADL items with 1-2 cues Additional ADL Goal #2: Pt will perform four part trail making task with 1-2 cues  OT Frequency: Min 2X/week   Barriers to D/C:            Co-evaluation               AM-PAC PT "6 Clicks" Daily Activity     Outcome Measure Help from another person eating meals?: None Help from another person taking care of personal grooming?: None Help from another person toileting, which includes using toliet, bedpan, or urinal?: A Little Help from another person bathing (including washing, rinsing, drying)?: A Little Help from another person to put on and taking off regular upper body clothing?: None Help from another person to put on and taking off regular lower body clothing?: A Little 6 Click Score: 21   End of Session Equipment Utilized During Treatment: Gait belt Nurse Communication: Mobility status  Activity Tolerance: Patient tolerated treatment well Patient left: in bed;with call bell/phone within reach;with bed alarm set;with family/visitor present  OT Visit Diagnosis: Unsteadiness on feet (R26.81);Other abnormalities of gait and mobility (R26.89);Muscle weakness (generalized) (M62.81);Other symptoms and signs involving cognitive function                Time: 1610-9604 OT Time Calculation (min): 27 min Charges:  OT General Charges $OT Visit: 1 Visit OT Evaluation $OT Eval Moderate Complexity: 1 Mod OT Treatments $Self Care/Home Management : 8-22 mins G-Codes:     Shelley Mccoy MSOT, OTR/L Acute Rehab Pager: [phone] Office: [phone]  Shelley Mccoy Shelley Mccoy 11/08/2017, 2:09 PM

## 2017-11-08 NOTE — Progress Notes (Signed)
Call in to pt's room by significant other in regards to return of pt's chief presenting complaints. Pt noted to be stuttering again, which had resolved earlier during the day. Pt also noted to have facial "twitch" which was present earlier, and also resolved. Hourly NIH assessment performed and exam was unchanged from earlier. On call neurologist, Dr Wilford CornerArora, paged and made aware of situation. No new orders given. Will continue to monitor. Delfino Lovettichie Azariya Freeman, RN, BSN 11/08/2017 4:39 AM

## 2017-11-08 NOTE — Progress Notes (Signed)
Pt en route to MRI. Dr. Roda ShuttersXu aware of MRI time. Okay per MD for pt to travel w/o RN.

## 2017-11-08 NOTE — Evaluation (Signed)
Clinical/Bedside Swallow Evaluation Patient Details  Name: Shelley Mccoy MRN: [id number] Date of Birth: 01/26/1971  Today's Date: 11/08/2017 Time: SLP Start Time (ACUTE ONLY): 1400 SLP Stop Time (ACUTE ONLY): 1410 SLP Time Calculation (min) (ACUTE ONLY): 10 min  Past Medical History:  Past Medical History:  Diagnosis Date  . Anemia   . Asthma   . Diabetes mellitus without complication (HCC)   . Heart murmur   . History of uterine fibroid   . HSV-2 (herpes simplex virus 2) infection   . Hypertension   . Seasonal allergies    Past Surgical History:  Past Surgical History:  Procedure Laterality Date  . CYSTECTOMY     hand and breast  . LAPAROSCOPIC ABDOMINAL EXPLORATION    . TOTAL ABDOMINAL HYSTERECTOMY     HPI:  47 y.o.femalewith history of DM, HTN, HSV-2 infection, anemiaadmitted for right facial droop and dysarthria.TPA givenaround 3pm 2/10.  Pt failed RN stroke swallow screen.     Assessment / Plan / Recommendation Clinical Impression  Pt presents with normal oropharyngeal swallow function marked by adequate mastication, brisk swallow response, no s/s of aspiration.  Recommend resuming a regular diet, thin liquids.  No SLP f/u needed.  SLP Visit Diagnosis: Dysphagia, unspecified (R13.10)    Aspiration Risk  No limitations    Diet Recommendation   regular solids, thin liquids  Medication Administration: Whole meds with liquid    Other  Recommendations Oral Care Recommendations: Oral care BID   Follow up Recommendations None      Frequency and Duration            Prognosis        Swallow Study   General Date of Onset: 11/07/17 HPI: 47 y.o.femalewith history of DM, HTN, HSV-2 infection, anemiaadmitted for right facial droop and dysarthria.TPA givenaround 3pm 2/10.  Pt failed RN stroke swallow screen.   Type of Study: Bedside Swallow Evaluation Previous Swallow Assessment: no Diet Prior to this Study: NPO Temperature Spikes Noted: No Respiratory  Status: Room air History of Recent Intubation: No Behavior/Cognition: Alert;Cooperative Oral Cavity Assessment: Within Functional Limits Oral Care Completed by SLP: No Oral Cavity - Dentition: Adequate natural dentition Vision: Functional for self-feeding Self-Feeding Abilities: Able to feed self Patient Positioning: Upright in bed Baseline Vocal Quality: Normal Volitional Cough: Strong Volitional Swallow: Able to elicit    Oral/Motor/Sensory Function Overall Oral Motor/Sensory Function: Other (comment)(Pt contracts right side of face with lips pursed toward righ)   Ice Chips Ice chips: Within functional limits   Thin Liquid Thin Liquid: Within functional limits Presentation: Cup;Self Fed    Nectar Thick Nectar Thick Liquid: Not tested   Honey Thick Honey Thick Liquid: Not tested   Puree Puree: Within functional limits Presentation: Self Fed   Solid   GO   Solid: Within functional limits Presentation: Self Fed        Shelley MountsCouture, Shelley Mccoy 11/08/2017,2:42 PM

## 2017-11-08 NOTE — Progress Notes (Signed)
STROKE TEAM PROGRESS NOTE   SUBJECTIVE (INTERVAL HISTORY) No family is at the bedside.  Overall she feels her condition is gradually improving. Speech more slurry not stuttering anymore. No arm or leg weakness but slow on the RUE. Lack of effort on exam. Stated that right UE and LE and facial about 75% sensation of left. MRI pending   OBJECTIVE Temp:  [97.4 F (36.3 C)-98 F (36.7 C)] 97.7 F (36.5 C) (02/11 0800) Pulse Rate:  [62-95] 76 (02/11 1100) Resp:  [7-36] 15 (02/11 1100) BP: (91-167)/(57-96) 127/74 (02/11 1100) SpO2:  [93 %-100 %] 100 % (02/11 1100) Weight:  [210 lb 4 oz (95.4 kg)] 210 lb 4 oz (95.4 kg) (02/10 1428)  Recent Labs  Lab 11/07/17 2002 11/07/17 2058 11/08/17 0501  GLUCAP 68 103* 69   Recent Labs  Lab 11/07/17 1429 11/07/17 1441 11/08/17 0347  NA 142 145 143  K 3.4* 3.3* 3.6  CL 109 107 109  CO2 28  --  24  GLUCOSE 67 63* 77  BUN 8 6 <5*  CREATININE 0.71 0.70 0.67  CALCIUM 8.9  --  8.6*  PHOS  --   --  3.5   Recent Labs  Lab 11/07/17 1429 11/08/17 0347  AST 24  --   ALT 29  --   ALKPHOS 103  --   BILITOT 0.9  --   PROT 7.6  --   ALBUMIN 4.0 3.3*   Recent Labs  Lab 11/07/17 1429 11/07/17 1441 11/08/17 0347  WBC 6.6  --  5.6  NEUTROABS 3.1  --   --   HGB 11.6* 11.9* 11.7*  HCT 34.6* 35.0* 36.5  MCV 77.6*  --  78.8  PLT 290  --  266   No results for input(s): CKTOTAL, CKMB, CKMBINDEX, TROPONINI in the last 168 hours. Recent Labs    11/07/17 1429  LABPROT 13.1  INR 1.00   No results for input(s): COLORURINE, LABSPEC, PHURINE, GLUCOSEU, HGBUR, BILIRUBINUR, KETONESUR, PROTEINUR, UROBILINOGEN, NITRITE, LEUKOCYTESUR in the last 72 hours.  Invalid input(s): APPERANCEUR     Component Value Date/Time   CHOL 123 11/08/2017 0347   CHOL 152 07/05/2017 0927   TRIG 47 11/08/2017 0347   HDL 45 11/08/2017 0347   HDL 55 07/05/2017 0927   CHOLHDL 2.7 11/08/2017 0347   VLDL 9 11/08/2017 0347   LDLCALC 69 11/08/2017 0347   LDLCALC 82  07/05/2017 0927   Lab Results  Component Value Date   HGBA1C 6.4 (H) 11/08/2017      Component Value Date/Time   LABOPIA NONE DETECTED 11/08/2017 0327   COCAINSCRNUR NONE DETECTED 11/08/2017 0327   LABBENZ NONE DETECTED 11/08/2017 0327   AMPHETMU NONE DETECTED 11/08/2017 0327   THCU NONE DETECTED 11/08/2017 0327   LABBARB NONE DETECTED 11/08/2017 0327    No results for input(s): ETH in the last 168 hours.  I have personally reviewed the radiological images below and agree with the radiology interpretations.  Ct Angio Head W Or Wo Contrast  Result Date: 11/07/2017 CLINICAL DATA:  Right-sided facial droop and aphasia. EXAM: CT ANGIOGRAPHY HEAD AND NECK TECHNIQUE: Multidetector CT imaging of the head and neck was performed using the standard protocol during bolus administration of intravenous contrast. Multiplanar CT image reconstructions and MIPs were obtained to evaluate the vascular anatomy. Carotid stenosis measurements (when applicable) are obtained utilizing NASCET criteria, using the distal internal carotid diameter as the denominator. CONTRAST:  ISOVUE-370 IOPAMIDOL (ISOVUE-370) INJECTION 76% COMPARISON:  CT head without contrast from  the same day. FINDINGS: CTA NECK FINDINGS Aortic arch: A 3 vessel arch configuration is present. No significant vascular calcifications or stenosis is present at the arch. Right carotid system: The right common carotid artery is within normal limits. Right carotid bifurcation is normal. The cervical right ICA is normal. Left carotid system: The left common carotid artery is within normal limits. The bifurcation is unremarkable. Cervical left ICA is within normal limits. Vertebral arteries: The left vertebral artery is dominant. Both vertebral arteries originate from the subclavian arteries without significant stenosis. There is no significant stenosis of either vertebral artery in the neck. Skeleton: Vertebral body heights and alignment are normal. There  is some straightening of the normal cervical lordosis. Congenital fusion is present at C2-3. No focal lytic or blastic lesions are present. Other neck: The soft tissues the neck are otherwise unremarkable. Thyroid is within normal limits. No significant adenopathy is present. No focal mucosal or submucosal lesions are evident. Upper chest: The lung apices are clear. Review of the MIP images confirms the above findings CTA HEAD FINDINGS Anterior circulation: The internal carotid arteries are within normal limits from the high cervical segments through the ICA termini bilaterally. The A1 and M1 segments are normal. The anterior communicating artery is patent. The MCA bifurcations are normal bilaterally. ACA and MCA branch vessels are within normal limits. Posterior circulation: The left vertebral artery is the dominant vessel. PICA origins are visualized and normal. The vertebrobasilar junction is normal. The basilar artery is unremarkable. Both posterior cerebral arteries originate from the basilar tip. PCA branch vessels are within normal limits. Venous sinuses: The dural sinuses are patent. The right transverse sinus is dominant. The straight sinus deep cerebral veins are intact. Cortical veins are unremarkable. Anatomic variants: None the postcontrast images demonstrate no pathologic enhancement. Delayed phase: The postcontrast images demonstrate no pathologic enhancement. Review of the MIP images confirms the above findings IMPRESSION: 1. No significant vascular disease in the neck. 2. Normal CTA circle-of-Willis without significant proximal stenosis, aneurysm, or branch vessel occlusion. 3. 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 Electronically Signed   By: Marin Roberts M.D.   On: 11/07/2017 16:01   Ct Angio Neck W And/or Wo Contrast  Result Date: 11/07/2017 CLINICAL DATA:  Right-sided facial droop and aphasia. EXAM: CT ANGIOGRAPHY HEAD AND NECK TECHNIQUE: Multidetector CT imaging of the  head and neck was performed using the standard protocol during bolus administration of intravenous contrast. Multiplanar CT image reconstructions and MIPs were obtained to evaluate the vascular anatomy. Carotid stenosis measurements (when applicable) are obtained utilizing NASCET criteria, using the distal internal carotid diameter as the denominator. CONTRAST:  ISOVUE-370 IOPAMIDOL (ISOVUE-370) INJECTION 76% COMPARISON:  CT head without contrast from the same day. FINDINGS: CTA NECK FINDINGS Aortic arch: A 3 vessel arch configuration is present. No significant vascular calcifications or stenosis is present at the arch. Right carotid system: The right common carotid artery is within normal limits. Right carotid bifurcation is normal. The cervical right ICA is normal. Left carotid system: The left common carotid artery is within normal limits. The bifurcation is unremarkable. Cervical left ICA is within normal limits. Vertebral arteries: The left vertebral artery is dominant. Both vertebral arteries originate from the subclavian arteries without significant stenosis. There is no significant stenosis of either vertebral artery in the neck. Skeleton: Vertebral body heights and alignment are normal. There is some straightening of the normal cervical lordosis. Congenital fusion is present at C2-3. No focal lytic or blastic lesions are  present. Other neck: The soft tissues the neck are otherwise unremarkable. Thyroid is within normal limits. No significant adenopathy is present. No focal mucosal or submucosal lesions are evident. Upper chest: The lung apices are clear. Review of the MIP images confirms the above findings CTA HEAD FINDINGS Anterior circulation: The internal carotid arteries are within normal limits from the high cervical segments through the ICA termini bilaterally. The A1 and M1 segments are normal. The anterior communicating artery is patent. The MCA bifurcations are normal bilaterally. ACA and MCA  branch vessels are within normal limits. Posterior circulation: The left vertebral artery is the dominant vessel. PICA origins are visualized and normal. The vertebrobasilar junction is normal. The basilar artery is unremarkable. Both posterior cerebral arteries originate from the basilar tip. PCA branch vessels are within normal limits. Venous sinuses: The dural sinuses are patent. The right transverse sinus is dominant. The straight sinus deep cerebral veins are intact. Cortical veins are unremarkable. Anatomic variants: None the postcontrast images demonstrate no pathologic enhancement. Delayed phase: The postcontrast images demonstrate no pathologic enhancement. Review of the MIP images confirms the above findings IMPRESSION: 1. No significant vascular disease in the neck. 2. Normal CTA circle-of-Willis without significant proximal stenosis, aneurysm, or branch vessel occlusion. 3. 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 Electronically Signed   By: Marin Roberts M.D.   On: 11/07/2017 16:01   Ct Head Code Stroke Wo Contrast  Result Date: 11/07/2017 CLINICAL DATA:  Code stroke. Sudden onset aphasia today. Symptoms began at 12. EXAM: CT HEAD WITHOUT CONTRAST TECHNIQUE: Contiguous axial images were obtained from the base of the skull through the vertex without intravenous contrast. COMPARISON:  None. FINDINGS: Brain: No evidence of acute infarction, hemorrhage, hydrocephalus, extra-axial collection or mass lesion/mass effect. Incidental scattered dural calcifications. Normal brain volume. Vascular: No hyperdense vessel or unexpected calcification. Skull: Normal. Negative for fracture or focal lesion. Sinuses/Orbits: Negative Other: These results were called by telephone at the time of interpretation on 11/07/2017 at 2:42 pm to Dr. Lorre Nick , who verbally acknowledged these results. ASPECTS [facility] Stroke Program Early CT Score) - Ganglionic level infarction (caudate, lentiform nuclei,  internal capsule, insula, M1-M3 cortex): 7 - Supraganglionic infarction (M4-M6 cortex): 3 Total score (0-10 with 10 being normal): 10 IMPRESSION: Negative head CT.  ASPECTS is 10. Electronically Signed   By: Marnee Spring M.D.   On: 11/07/2017 14:42   MRI pending  TTE pending   PHYSICAL EXAM  Temp:  [97.4 F (36.3 C)-98 F (36.7 C)] 97.7 F (36.5 C) (02/11 0800) Pulse Rate:  [62-95] 76 (02/11 1100) Resp:  [7-36] 15 (02/11 1100) BP: (91-167)/(57-96) 127/74 (02/11 1100) SpO2:  [93 %-100 %] 100 % (02/11 1100) Weight:  [210 lb 4 oz (95.4 kg)] 210 lb 4 oz (95.4 kg) (02/10 1428)  General - Well nourished, well developed, in no apparent distress.  Ophthalmologic - fundi not visualized due to noncooperation.  Cardiovascular - Regular rate and rhythm with no murmur.  Mental Status -  Level of arousal and orientation to time, place, and person were intact. Language including expression, naming, repetition, comprehension was assessed and found intact. Mild to moderate dysarthria Fund of Knowledge was assessed and was intact.  Cranial Nerves II - XII - II - Visual field intact OU. III, IV, VI - Extraocular movements intact. V - Facial sensation intact bilaterally. VII - right lower facial weakness, however, lack of effort on exam VIII - Hearing & vestibular intact bilaterally. X - Palate elevates symmetrically.  XI - Chin turning & shoulder shrug intact bilaterally. XII - Tongue protrusion intact.  Motor Strength - The patient's strength was normal in all extremities and pronator drift was absent.  However slow motion on the right UE. Bulk was normal and fasciculations were absent.   Motor Tone - Muscle tone was assessed at the neck and appendages and was normal.  Reflexes - The patient's reflexes were symmetrical in all extremities and she had no pathological reflexes.  Sensory - Light touch, temperature/pinprick were assessed and were subjectively decreased on the right, about 75%  of the left    Coordination - The patient had normal movements in the hands with no ataxia or dysmetria.  Tremor was absent.  Gait and Station - deferred.   ASSESSMENT/PLAN Ms. Shelley Mccoy is a 47 y.o. female with history of DM, HTN, HSV-2 infection, anemia admitted for right facial droop and dysarthria. TPA given around 3pm.    Stroke vs. Conversion disorder - symptoms not quite typical for stroke, MRI pending  Resultant dysarthria and right facial droop but lack of effort on exam  MRI  pending  CTA head and neck - unremarkable  2D Echo  pending  LDL 69  HgbA1c 6.4  SCDs for VTE prophylaxis  Diet NPO time specified  Check puncture sites for bleeding or hematomas.  Bleeding precautions  Fall precautions   No antithrombotic prior to admission, now on No antithrombotic within 24h of tPA  Ongoing aggressive stroke risk factor management  Therapy recommendations:  Pending   Disposition:  pending  Diabetes  HgbA1c 6.4 goal < 7.0  Controlled  CBG monitoring  SSI  Hypertension Stable Permissive hypertension (OK if <180/105) for 24-48 hours post stroke and then gradually normalized within 5-7 days.  Long term BP goal normotensive  Hyperlipidemia  Home meds:  pravastatin   LDL 69, goal < 70  Resume pravastatin after po access  Continue statin at discharge  Other Stroke Risk Factors  Obesity, Body mass index is 41.06 kg/m.   Other Active Problems  Questionable for conversion disorder  Hospital day # 1  This patient is critically ill due to suspected stroke s/p tPA and at significant risk of neurological worsening, death form hemorrhage, recurrent stroke. This patient's care requires constant monitoring of vital signs, hemodynamics, respiratory and cardiac monitoring, review of multiple databases, neurological assessment, discussion with family, other specialists and medical decision making of high complexity. I spent 35 minutes of neurocritical  care time in the care of this patient.  Marvel PlanJindong Beola Vasallo, MD PhD Stroke Neurology 11/08/2017 12:18 PM    To contact Stroke Continuity provider, please refer to [email or web address]. After hours, contact General Neurology

## 2017-11-08 NOTE — Plan of Care (Signed)
Pt able to communicate needs with staff. Pt has positive attitude about situation and able to participate in self-care.

## 2017-11-08 NOTE — Progress Notes (Signed)
Pt arrived to unit via wheelchair from 4N accompanied by her family. Pt stand by assist ambulating to bed. Telemetry hooked up to patient box 3W15, NSR. VSS. BP 115/61 (BP Location: Right Arm)   Pulse 78   Temp 98.6 F (37 C) (Oral)   Resp (!) 21   Ht 5' (1.524 m)   Wt 95.4 kg (210 lb 4 oz)   SpO2 100%   BMI 41.06 kg/m  Call bell within reach.

## 2017-11-08 NOTE — Progress Notes (Signed)
Inpatient Diabetes Program Recommendations  AACE/ADA: New Consensus Statement on Inpatient Glycemic Control (2015)  Target Ranges:  Prepandial:   less than 140 mg/dL      Peak postprandial:   less than 180 mg/dL (1-2 hours)      Critically ill patients:  140 - 180 mg/dL   Lab Results  Component Value Date   GLUCAP 79 11/08/2017   HGBA1C 6.4 (H) 11/08/2017    Review of Glycemic ControlResults for Annita BrodMCLEOD, Autumm (MRN [id number]) as of 11/08/2017 12:27  Ref. Range 11/07/2017 20:02 11/07/2017 20:58 11/08/2017 05:01 11/08/2017 12:15  Glucose-Capillary Latest Ref Range: 65 - 99 mg/dL 68 409103 (H) 69 79   Diabetes history: Type 2 DM Outpatient Diabetes medications:  Lantus?, Glucotrol XL 10 mg daily Current orders for Inpatient glycemic control:  None Inpatient Diabetes Program Recommendations:   Blood sugars < 100 mg/dL.  Please order CBG's q 4 hours due to history of DM and DM medications.    Thanks,  Beryl MeagerJenny Korie Brabson, RN, BC-ADM Inpatient Diabetes Coordinator Pager [phone] (8a-5p)

## 2017-11-08 NOTE — Evaluation (Signed)
Speech Language Pathology Evaluation Patient Details Name: Annita Brodarchie Onstott MRN: [id number] DOB: 11/07/1970 Today's Date: 11/08/2017 Time: [id number] SLP Time Calculation (min) (ACUTE ONLY): 12 min  Problem List:  Patient Active Problem List   Diagnosis Date Noted  . 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 Anemia   . Asthma   . Diabetes mellitus without complication (HCC)   . Heart murmur   . History of uterine fibroid   . HSV-2 (herpes simplex virus 2) infection   . Hypertension   . Seasonal allergies    Past Surgical History:  Past Surgical History:  Procedure Laterality Date  . CYSTECTOMY     hand and breast  . LAPAROSCOPIC ABDOMINAL EXPLORATION    . TOTAL ABDOMINAL HYSTERECTOMY     HPI:  47 y.o.femalewith history of DM, HTN, HSV-2 infection, anemiaadmitted for right facial droop and dysarthria.TPA givenaround 3pm 2/10.  Pt failed RN stroke swallow screen.     Assessment / Plan / Recommendation Clinical Impression  Pt presents with impaired intelligibility that waxes and wanes during assessment.  CN exam reveals contraction of right face with pursing of lips toward the right; tongue is midline.  There is presence of contraction during propositional speech; it is not present during non-speech tasks (when at rest or when eating/drinking). Speech notable for occasional repetition of the last one-two words of a sentence (palilalia) - this occurred at beginning of session but did not recur during further conversation.  Pt demonstrated sound substitutions inconsistently throughout exam (e.g., "w" for "r"); she maintained consonant blends but distorted other consonants inconsistently.  Speech is difficult to characterize as an apraxia nor a typical dysarthria. MRI is pending - will follow for results and develop treatment plan accordingly.     SLP Assessment  SLP Recommendation/Assessment: (TBA) SLP Visit Diagnosis:  Dysarthria and anarthria (R47.1)    Follow Up Recommendations  None    Frequency and Duration min 1 x/week  1 week      SLP Evaluation Cognition  Overall Cognitive Status: Impaired/Different from baseline Arousal/Alertness: Awake/alert Orientation Level: Oriented X4       Comprehension  Auditory Comprehension Overall Auditory Comprehension: Appears within functional limits for tasks assessed Visual Recognition/Discrimination Discrimination: Within Function Limits    Expression Expression Primary Mode of Expression: Verbal Verbal Expression Overall Verbal Expression: Appears within functional limits for tasks assessed Written Expression Dominant Hand: Right   Oral / Motor  Oral Motor/Sensory Function Overall Oral Motor/Sensory Function: Other (comment)(contracts right side of face, pursing lips to right) Motor Speech Overall Motor Speech: Other (comment)(see clinical impressions) Respiration: Within functional limits Phonation: Normal Resonance: Within functional limits Articulation: (inconsistent output) Intelligibility: Intelligibility reduced Word: 75-100% accurate Sentence: 75-100% accurate Motor Planning: Witnin functional limits   GO                    [name], Aleesa Sweigert Laurice 11/08/2017, 2:54 PM

## 2017-11-09 ENCOUNTER — Encounter: Payer: Self-pay | Admitting: Neurology

## 2017-11-09 ENCOUNTER — Inpatient Hospital Stay (HOSPITAL_COMMUNITY): Payer: Self-pay

## 2017-11-09 DIAGNOSIS — F449 Dissociative and conversion disorder, unspecified: Secondary | ICD-10-CM

## 2017-11-09 DIAGNOSIS — I10 Essential (primary) hypertension: Secondary | ICD-10-CM

## 2017-11-09 DIAGNOSIS — E785 Hyperlipidemia, unspecified: Secondary | ICD-10-CM

## 2017-11-09 DIAGNOSIS — R471 Dysarthria and anarthria: Secondary | ICD-10-CM

## 2017-11-09 LAB — GLUCOSE, CAPILLARY
Glucose-Capillary: 120 mg/dL — ABNORMAL HIGH (ref 65–99)
Glucose-Capillary: 109 mg/dL — ABNORMAL HIGH (ref 65–99)

## 2017-11-09 LAB — ECHOCARDIOGRAM COMPLETE
Height: 60 in
Weight: 3364 oz

## 2017-11-09 NOTE — Discharge Summary (Signed)
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 Anemia   . Asthma   . Diabetes mellitus without complication (HCC)   . Heart murmur   . History of uterine fibroid   . HSV-2 (herpes simplex virus 2) infection   . Hypertension   . Seasonal allergies    Past Surgical History:  Procedure Laterality Date  . CYSTECTOMY     hand and breast  . LAPAROSCOPIC ABDOMINAL EXPLORATION    . TOTAL ABDOMINAL HYSTERECTOMY      Allergies as of 11/09/2017      Reactions   Percocet [oxycodone-acetaminophen] Hives      Medication List    TAKE these medications   ALLER-EASE 180 MG tablet Generic drug:  fexofenadine Take 180 mg by mouth daily.   CONTRAVE 8-90 MG Tb12 Generic drug:  Naltrexone-Bupropion HCl ER Take by mouth.   estradiol 0.1 MG/GM vaginal cream Commonly known as:  ESTRACE Apply 1 gram per vagina every night for 2 weeks, then apply three times a week   fluticasone-salmeterol 115-21 MCG/ACT inhaler Commonly known as:  ADVAIR HFA Inhale 2 puffs into the lungs 2 (two) times daily.   gabapentin 300 MG capsule Commonly known as:  NEURONTIN Take 300 mg by mouth 3 (three) times daily.   glipiZIDE 10 MG 24 hr tablet Commonly known as:  GLUCOTROL XL Take 10 mg by mouth daily with breakfast.   insulin glargine 100 UNIT/ML injection Commonly known as:  LANTUS Inject into the skin at bedtime.   losartan-hydrochlorothiazide 50-12.5 MG tablet Commonly known as:  HYZAAR Take 1 tablet by mouth  daily.   pravastatin 20 MG tablet Commonly known as:  PRAVACHOL Take 20 mg by mouth daily.   valACYclovir 1000 MG tablet Commonly known as:  VALTREX Take 1,000 mg by mouth 2 (two) times daily.   VENTOLIN HFA 108 (90 Base) MCG/ACT inhaler Generic drug:  albuterol Inhale into the lungs every 6 (six) hours as needed for wheezing or shortness of breath.   verapamil 40 MG tablet Commonly known as:  CALAN Take 40 mg by mouth daily.            Durable Medical Equipment  (From admission, onward)        Start     Ordered   11/09/17 1102  For home use only DME Walker rolling  Once    Question:  Patient needs a walker to treat with the following condition  Answer:  Weakness   11/09/17 1101     LABORATORY STUDIES CBC    Component Value Date/Time   WBC 5.6 11/08/2017 0347   RBC 4.63 11/08/2017 0347   HGB 11.7 (L) 11/08/2017 0347   HGB 12.2 07/05/2017 0927   HCT 36.5 11/08/2017 0347   HCT 37.7 07/05/2017 0927   PLT 266 11/08/2017 0347   PLT 262 07/05/2017 0927   MCV 78.8 11/08/2017 0347   MCV 78 (L) 07/05/2017 0927   MCH 25.3 (L) 11/08/2017 0347   MCHC 32.1 11/08/2017 0347  RDW 15.4 11/08/2017 0347   RDW 16.5 (H) 07/05/2017 0927   LYMPHSABS 3.0 11/07/2017 1429   MONOABS 0.3 11/07/2017 1429   EOSABS 0.1 11/07/2017 1429   BASOSABS 0.0 11/07/2017 1429   CMP    Component Value Date/Time   NA 143 11/08/2017 0347   NA 144 07/05/2017 0927   K 3.6 11/08/2017 0347   CL 109 11/08/2017 0347   CO2 24 11/08/2017 0347   GLUCOSE 77 11/08/2017 0347   BUN <5 (L) 11/08/2017 0347   BUN 13 07/05/2017 0927   CREATININE 0.67 11/08/2017 0347   CALCIUM 8.6 (L) 11/08/2017 0347   PROT 7.6 11/07/2017 1429   PROT 7.1 07/05/2017 0927   ALBUMIN 3.3 (L) 11/08/2017 0347   ALBUMIN 4.1 07/05/2017 0927   AST 24 11/07/2017 1429   ALT 29 11/07/2017 1429   ALKPHOS 103 11/07/2017 1429   BILITOT 0.9 11/07/2017 1429   BILITOT 0.8 07/05/2017 0927   GFRNONAA >60 11/08/2017 0347   GFRAA >60  11/08/2017 0347   COAGS Lab Results  Component Value Date   INR 1.00 11/07/2017   Lipid Panel    Component Value Date/Time   CHOL 123 11/08/2017 0347   CHOL 152 07/05/2017 0927   TRIG 47 11/08/2017 0347   HDL 45 11/08/2017 0347   HDL 55 07/05/2017 0927   CHOLHDL 2.7 11/08/2017 0347   VLDL 9 11/08/2017 0347   LDLCALC 69 11/08/2017 0347   LDLCALC 82 07/05/2017 0927   HgbA1C  Lab Results  Component Value Date   HGBA1C 6.4 (H) 11/08/2017   Urinalysis No results found for: COLORURINE, APPEARANCEUR, LABSPEC, PHURINE, GLUCOSEU, HGBUR, BILIRUBINUR, KETONESUR, PROTEINUR, UROBILINOGEN, NITRITE, LEUKOCYTESUR Urine Drug Screen     Component Value Date/Time   LABOPIA NONE DETECTED 11/08/2017 0327   COCAINSCRNUR NONE DETECTED 11/08/2017 0327   LABBENZ NONE DETECTED 11/08/2017 0327   AMPHETMU NONE DETECTED 11/08/2017 0327   THCU NONE DETECTED 11/08/2017 0327   LABBARB NONE DETECTED 11/08/2017 0327    Alcohol Level No results found for: [facility]  SIGNIFICANT DIAGNOSTIC STUDIES Ct Angio Head W Or Wo Contrast  Result Date: 11/07/2017 CLINICAL DATA:  Right-sided facial droop and aphasia. EXAM: CT ANGIOGRAPHY HEAD AND NECK TECHNIQUE: Multidetector CT imaging of the head and neck was performed using the standard protocol during bolus administration of intravenous contrast. Multiplanar CT image reconstructions and MIPs were obtained to evaluate the vascular anatomy. Carotid stenosis measurements (when applicable) are obtained utilizing NASCET criteria, using the distal internal carotid diameter as the denominator. CONTRAST:  ISOVUE-370 IOPAMIDOL (ISOVUE-370) INJECTION 76% COMPARISON:  CT head without contrast from the same day. FINDINGS: CTA NECK FINDINGS Aortic arch: A 3 vessel arch configuration is present. No significant vascular calcifications or stenosis is present at the arch. Right carotid system: The right common carotid artery is within normal limits. Right carotid bifurcation is  normal. The cervical right ICA is normal. Left carotid system: The left common carotid artery is within normal limits. The bifurcation is unremarkable. Cervical left ICA is within normal limits. Vertebral arteries: The left vertebral artery is dominant. Both vertebral arteries originate from the subclavian arteries without significant stenosis. There is no significant stenosis of either vertebral artery in the neck. Skeleton: Vertebral body heights and alignment are normal. There is some straightening of the normal cervical lordosis. Congenital fusion is present at C2-3. No focal lytic or blastic lesions are present. Other neck: The soft tissues the neck are otherwise unremarkable. Thyroid is within normal limits. No significant adenopathy  is present. No focal mucosal or submucosal lesions are evident. Upper chest: The lung apices are clear. Review of the MIP images confirms the above findings CTA HEAD FINDINGS Anterior circulation: The internal carotid arteries are within normal limits from the high cervical segments through the ICA termini bilaterally. The A1 and M1 segments are normal. The anterior communicating artery is patent. The MCA bifurcations are normal bilaterally. ACA and MCA branch vessels are within normal limits. Posterior circulation: The left vertebral artery is the dominant vessel. PICA origins are visualized and normal. The vertebrobasilar junction is normal. The basilar artery is unremarkable. Both posterior cerebral arteries originate from the basilar tip. PCA branch vessels are within normal limits. Venous sinuses: The dural sinuses are patent. The right transverse sinus is dominant. The straight sinus deep cerebral veins are intact. Cortical veins are unremarkable. Anatomic variants: None the postcontrast images demonstrate no pathologic enhancement. Delayed phase: The postcontrast images demonstrate no pathologic enhancement. Review of the MIP images confirms the above findings IMPRESSION:  1. No significant vascular disease in the neck. 2. Normal CTA circle-of-Willis without significant proximal stenosis, aneurysm, or branch vessel occlusion. 3. 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 Electronically Signed   By: Marin Roberts M.D.   On: 11/07/2017 16:01   Ct Angio Neck W And/or Wo Contrast  Result Date: 11/07/2017 CLINICAL DATA:  Right-sided facial droop and aphasia. EXAM: CT ANGIOGRAPHY HEAD AND NECK TECHNIQUE: Multidetector CT imaging of the head and neck was performed using the standard protocol during bolus administration of intravenous contrast. Multiplanar CT image reconstructions and MIPs were obtained to evaluate the vascular anatomy. Carotid stenosis measurements (when applicable) are obtained utilizing NASCET criteria, using the distal internal carotid diameter as the denominator. CONTRAST:  ISOVUE-370 IOPAMIDOL (ISOVUE-370) INJECTION 76% COMPARISON:  CT head without contrast from the same day. FINDINGS: CTA NECK FINDINGS Aortic arch: A 3 vessel arch configuration is present. No significant vascular calcifications or stenosis is present at the arch. Right carotid system: The right common carotid artery is within normal limits. Right carotid bifurcation is normal. The cervical right ICA is normal. Left carotid system: The left common carotid artery is within normal limits. The bifurcation is unremarkable. Cervical left ICA is within normal limits. Vertebral arteries: The left vertebral artery is dominant. Both vertebral arteries originate from the subclavian arteries without significant stenosis. There is no significant stenosis of either vertebral artery in the neck. Skeleton: Vertebral body heights and alignment are normal. There is some straightening of the normal cervical lordosis. Congenital fusion is present at C2-3. No focal lytic or blastic lesions are present. Other neck: The soft tissues the neck are otherwise unremarkable. Thyroid is within normal  limits. No significant adenopathy is present. No focal mucosal or submucosal lesions are evident. Upper chest: The lung apices are clear. Review of the MIP images confirms the above findings CTA HEAD FINDINGS Anterior circulation: The internal carotid arteries are within normal limits from the high cervical segments through the ICA termini bilaterally. The A1 and M1 segments are normal. The anterior communicating artery is patent. The MCA bifurcations are normal bilaterally. ACA and MCA branch vessels are within normal limits. Posterior circulation: The left vertebral artery is the dominant vessel. PICA origins are visualized and normal. The vertebrobasilar junction is normal. The basilar artery is unremarkable. Both posterior cerebral arteries originate from the basilar tip. PCA branch vessels are within normal limits. Venous sinuses: The dural sinuses are patent. The right transverse sinus is dominant. The straight  sinus deep cerebral veins are intact. Cortical veins are unremarkable. Anatomic variants: None the postcontrast images demonstrate no pathologic enhancement. Delayed phase: The postcontrast images demonstrate no pathologic enhancement. Review of the MIP images confirms the above findings IMPRESSION: 1. No significant vascular disease in the neck. 2. Normal CTA circle-of-Willis without significant proximal stenosis, aneurysm, or branch vessel occlusion. 3. 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 Electronically Signed   By: Marin Robertshristopher  Mattern M.D.   On: 11/07/2017 16:01   Ct Head Code Stroke Wo Contrast  Result Date: 11/07/2017 CLINICAL DATA:  Code stroke. Sudden onset aphasia today. Symptoms began at 12. EXAM: CT HEAD WITHOUT CONTRAST TECHNIQUE: Contiguous axial images were obtained from the base of the skull through the vertex without intravenous contrast. COMPARISON:  None. FINDINGS: Brain: No evidence of acute infarction, hemorrhage, hydrocephalus, extra-axial collection or mass  lesion/mass effect. Incidental scattered dural calcifications. Normal brain volume. Vascular: No hyperdense vessel or unexpected calcification. Skull: Normal. Negative for fracture or focal lesion. Sinuses/Orbits: Negative Other: These results were called by telephone at the time of interpretation on 11/07/2017 at 2:42 pm to Dr. Lorre NickANTHONY ALLEN , who verbally acknowledged these results. ASPECTS [facility]([place] Stroke Program Early CT Score) - Ganglionic level infarction (caudate, lentiform nuclei, internal capsule, insula, M1-M3 cortex): 7 - Supraganglionic infarction (M4-M6 cortex): 3 Total score (0-10 with 10 being normal): 10 IMPRESSION: Negative head CT.  ASPECTS is 10. Electronically Signed   By: Marnee SpringJonathon  Watts M.D.   On: 11/07/2017 14:42    MRI  IMPRESSION: No acute intracranial abnormality. Unremarkable appearance of the brain  TTE  Study Conclusions - Left ventricle: The cavity size was normal. Wall thickness was   normal. Systolic function was normal. The estimated ejection   fraction was in the range of 60% to 65%. Wall motion was normal;   there were no regional wall motion abnormalities. There was no   evidence of elevated ventricular filling pressure by Doppler   parameters. Impressions:- No cardiac source of emboli was indentified.    HISTORY OF PRESENT ILLNESS   &   HOSPITAL COURSE ASSESSMENT/PLAN Ms. Shelley Mccoy is a 47 y.o. female with history of DM, HTN, HSV-2 infection, anemia admitted for right facial droop and dysarthria. TPA given around 3pm.    Conversion disorder - symptoms not consistent with stroke, MRI neg  Resultant - intermittent dysarthria associated with anxiety  MRI  No acute findings  CTA head and neck - unremarkable  2D Echo  EF 60-65%. No cardiac source  LDL 69  HgbA1c 6.4  SCDs for VTE prophylaxis  Diet NPO time specified  Check puncture sites for bleeding or hematomas.  No antithrombotic prior to admission, now on No antithrombotic as there  is no stroke  Ongoing aggressive stroke risk factor management  Therapy recommendations: Outpatient PT/OT/SLP  Disposition:  HOME  Diabetes  HgbA1c 6.4 goal < 7.0  Controlled  CBG monitoring  SSI  Hypertension  Stable  Long term BP goal normotensive  Hyperlipidemia  Home meds:  pravastatin   LDL 69, goal < 70  Resume pravastatin   Continue statin at discharge  Other Stroke Risk Factors  Obesity, Body mass index is 41.06 kg/m.   Other Active Problems  Recent stress with work and finance  DISCHARGE EXAM Blood pressure 113/64, pulse 91, temperature 98 F (36.7 C), temperature source Oral, resp. rate 18, height 5' (1.524 m), weight 95.4 kg (210 lb 4 oz), SpO2 99 %. General - Well nourished, well developed, in no  apparent distress.  Ophthalmologic - fundi not visualized due to noncooperation.  Cardiovascular - Regular rate and rhythm with no murmur.  Mental Status -  Level of arousal and orientation to time, place, and person were intact. Language including expression, naming, repetition, comprehension was assessed and found intact. Mild to moderate intermittent dysarthria, stuttering speech pattern Fund of Knowledge was assessed and was intact.  Cranial Nerves II - XII - II - Visual field intact OU. III, IV, VI - Extraocular movements intact. V - Facial sensation intact bilaterally. VII - right lower facial asymmetry on purpose VIII - Hearing & vestibular intact bilaterally. X - Palate elevates symmetrically. XI - Chin turning & shoulder shrug intact bilaterally. XII - Tongue protrusion intact.  Motor Strength - The patient's strength was normal in all extremities and pronator drift was absent.  However slow motion on the right UE. Bulk was normal and fasciculations were absent.   Motor Tone - Muscle tone was assessed at the neck and appendages and was normal.  Reflexes - The patient's reflexes were symmetrical in all extremities and she  had no pathological reflexes.  Sensory - Light touch, temperature/pinprick were assessed and were subjectively decreased on the right, about 75% of the left    Coordination - The patient had normal movements in the hands with no ataxia or dysmetria.  Tremor was absent.  Gait and Station - deferred.  Discharge Diet   Bleeding precautions Fall precautions Diet Carb Modified Fluid consistency: Thin; Room service appropriate? Yes liquids  DISCHARGE PLAN  Disposition:  HOME  No antithrombotic for secondary stroke prevention.  Ongoing risk factor control by Primary Care Physician at time of discharge  Follow-up in 2 weeks with PCP in [place], Case Management to assist finding PCP in [place] area.  Recommend psychology referral and follow up   Greater than 30 minutes were spent preparing discharge.  Beryl Meager, ANP-C Neurology Stroke Team 11/09/2017 1:35 PM  ATTENDING NOTE: I reviewed above note and agree with the assessment and plan. I have made any additions or clarifications directly to the above note. Pt was seen and examined.   Pt symptoms and exam and work up consistent with conversion disorder. Will need PCP follow up and recommend psychology referral. No neuro follow up needed at this time. No need of antiplatelet. Pt educated on relaxation skills and cope with stress.  Marvel Plan, MD PhD Stroke Neurology 11/09/2017 6:56 PM

## 2017-11-09 NOTE — Plan of Care (Signed)
Pt is a/o x4. NP notified of r facial droop, slurred speech, r sided Ha, and unsteady gait. NP to bedside and unconcerned. Sr, vss. Ra. Bilateral breath sounds diminished. Bowel sounds present. No bm. Voiding. Able to turn self. Falls precautions maintained. Scds for vte. Family at bedside. ECHO currently in process. Plan to d/c home today. Will continue to monitor.

## 2017-11-09 NOTE — Progress Notes (Signed)
Pt called out to nurse station stating she has a more significant facial droop. Upon assessing patient, she has a worsened right sided facial droop and new NIH score of a 5 vs a 3 overnight. Arlice ColtMary Costello, NP was paged with new finding. Awaiting call back.

## 2017-11-09 NOTE — Plan of Care (Signed)
Adequate for Discharge Education: Knowledge of disease or condition will improve 11/09/2017 1334 - Adequate for Discharge by Andy Gaussunzweiler, Arriyah Madej P, RN 11/09/2017 1137 - Progressing by Andy Gaussunzweiler, Velta Rockholt P, RN Knowledge of secondary prevention will improve 11/09/2017 1334 - Adequate for Discharge by Andy Gaussunzweiler, Kamron Vanwyhe P, RN 11/09/2017 1137 - Progressing by Andy Gaussunzweiler, Fabiha Rougeau P, RN Knowledge of patient specific risk factors addressed and post discharge goals established will improve 11/09/2017 1334 - Adequate for Discharge by Andy Gaussunzweiler, Elantra Caprara P, RN 11/09/2017 1137 - Progressing by Andy Gaussunzweiler, Haze Antillon P, RN Coping: Will verbalize positive feelings about self 11/09/2017 1334 - Adequate for Discharge by Andy Gaussunzweiler, Wilsie Kern P, RN 11/09/2017 1137 - Progressing by Andy Gaussunzweiler, Andreyah Natividad P, RN Will identify appropriate support needs 11/09/2017 1334 - Adequate for Discharge by Andy Gaussunzweiler, Clois Treanor P, RN 11/09/2017 1137 - Progressing by Andy Gaussunzweiler, Phillis Thackeray P, RN Health Behavior/Discharge Planning: Ability to manage health-related needs will improve 11/09/2017 1334 - Adequate for Discharge by Andy Gaussunzweiler, Leshea Jaggers P, RN 11/09/2017 1137 - Progressing by Andy Gaussunzweiler, Sanyah Molnar P, RN Self-Care: Ability to participate in self-care as condition permits will improve 11/09/2017 1334 - Adequate for Discharge by Andy Gaussunzweiler, Samora Jernberg P, RN 11/09/2017 1137 - Progressing by Andy Gaussunzweiler, Jaxsyn Catalfamo P, RN Verbalization of feelings and concerns over difficulty with self-care will improve 11/09/2017 1334 - Adequate for Discharge by Andy Gaussunzweiler, Lizzett Nobile P, RN 11/09/2017 1137 - Progressing by Andy Gaussunzweiler, Antaniya Venuti P, RN Ability to communicate needs accurately will improve 11/09/2017 1334 - Adequate for Discharge by Andy Gaussunzweiler, Canden Cieslinski P, RN 11/09/2017 1137 - Progressing by Andy Gaussunzweiler, Karilyn Wind P, RN Nutrition: Risk of aspiration will decrease 11/09/2017 1334 - Adequate for Discharge by Andy Gaussunzweiler, Detavious Rinn P, RN 11/09/2017  1137 - Progressing by Andy Gaussunzweiler, Julieana Eshleman P, RN Dietary intake will improve 11/09/2017 1334 - Adequate for Discharge by Andy Gaussunzweiler, Morrisa Aldaba P, RN 11/09/2017 1137 - Progressing by Andy Gaussunzweiler, Kalila Adkison P, RN Ischemic Stroke/TIA Tissue Perfusion: Complications of ischemic stroke/TIA will be minimized 11/09/2017 1334 - Adequate for Discharge by Andy Gaussunzweiler, Latash Nouri P, RN 11/09/2017 1137 - Progressing by Andy Gaussunzweiler, Treyon Wymore P, RN Education: Knowledge of General Education information will improve 11/09/2017 1334 - Adequate for Discharge by Andy Gaussunzweiler, Perseus Westall P, RN 11/09/2017 1137 - Progressing by Andy Gaussunzweiler, Vernadette Stutsman P, RN Health Behavior/Discharge Planning: Ability to manage health-related needs will improve 11/09/2017 1334 - Adequate for Discharge by Andy Gaussunzweiler, Idella Lamontagne P, RN 11/09/2017 1137 - Progressing by Andy Gaussunzweiler, Malcom Selmer P, RN Clinical Measurements: Ability to maintain clinical measurements within normal limits will improve 11/09/2017 1334 - Adequate for Discharge by Andy Gaussunzweiler, Skyeler Smola P, RN 11/09/2017 1137 - Progressing by Andy Gaussunzweiler, Natlie Asfour P, RN Will remain free from infection 11/09/2017 1334 - Adequate for Discharge by Andy Gaussunzweiler, Margreat Widener P, RN 11/09/2017 1137 - Progressing by Andy Gaussunzweiler, Eligh Rybacki P, RN Diagnostic test results will improve 11/09/2017 1334 - Adequate for Discharge by Andy Gaussunzweiler, Lynard Postlewait P, RN 11/09/2017 1137 - Progressing by Andy Gaussunzweiler, Kiowa Hollar P, RN Respiratory complications will improve 11/09/2017 1334 - Adequate for Discharge by Andy Gaussunzweiler, Myrle Wanek P, RN 11/09/2017 1137 - Progressing by Andy Gaussunzweiler, Camellia Popescu P, RN Cardiovascular complication will be avoided 11/09/2017 1334 - Adequate for Discharge by Andy Gaussunzweiler, Rekita Miotke P, RN 11/09/2017 1137 - Progressing by Andy Gaussunzweiler, Adriann Thau P, RN Activity: Risk for activity intolerance will decrease 11/09/2017 1334 - Adequate for Discharge by Andy Gaussunzweiler, Zahara Rembert P, RN 11/09/2017 1137 - Progressing by Andy Gaussunzweiler, Fabiola Mudgett  P, RN Nutrition: Adequate nutrition will be maintained 11/09/2017 1334 - Adequate for Discharge by Andy Gaussunzweiler, Tinya Cadogan P, RN 11/09/2017 1137 - Progressing by Andy Gaussunzweiler, Jalaiya Oyster P, RN Coping: Level of anxiety will decrease 11/09/2017 1334 - Adequate for Discharge by  Kameko Hukill, Arline Asp, RN 11/09/2017 1137 - Progressing by Andy Gauss, RN Elimination: Will not experience complications related to bowel motility 11/09/2017 1334 - Adequate for Discharge by Andy Gauss, RN 11/09/2017 1137 - Progressing by Andy Gauss, RN Will not experience complications related to urinary retention 11/09/2017 1334 - Adequate for Discharge by Andy Gauss, RN 11/09/2017 1137 - Progressing by Andy Gauss, RN Pain Managment: General experience of comfort will improve 11/09/2017 1334 - Adequate for Discharge by Andy Gauss, RN 11/09/2017 1137 - Progressing by Andy Gauss, RN Safety: Ability to remain free from injury will improve 11/09/2017 1334 - Adequate for Discharge by Andy Gauss, RN 11/09/2017 1137 - Progressing by Andy Gauss, RN Skin Integrity: Risk for impaired skin integrity will decrease 11/09/2017 1334 - Adequate for Discharge by Andy Gauss, RN 11/09/2017 1137 - Progressing by Andy Gauss, RN

## 2017-11-09 NOTE — Care Management Note (Addendum)
Case Management Note  Patient Details  Name: Shelley Mccoy MRN: [id number] Date of Birth: 1970/12/14  Subjective/Objective:       Pt in to r/o CVA. She is from home with her boyfriend. Pt has no insurance but has a PCP.              Action/Plan: CM met with the patient and she is interested in getting connected with one of the [facility]. CM called and all 3 are full. CM provided her with the number for [facility] so she can call Monday and hopefully get an appointment. CM also provided her with the [facility] number that will start scheduling appts after the 18th of February.  CM consulted for outpatient therapy. Pt would like to attend [employer]. Orders in Epic and information on the AVS. Pt with orders for walker. Butch Penny with [facility] notified and will deliver to the room. Family to provide supervision at home.  No new medications at d/c.  Family to provide transportation home.   Expected Discharge Date:  11/09/17               Expected Discharge Plan:  OP Rehab  In-House Referral:     Discharge planning Services  CM Consult, [facility]  Post Acute Care Choice:    Choice offered to:     DME Arranged:    DME Agency:     HH Arranged:    HH Agency:     Status of Service:  Completed, signed off  If discussed at [employer], dates discussed:    Additional Comments:  Pollie Friar, RN 11/09/2017, 2:04 PM

## 2017-11-09 NOTE — Progress Notes (Signed)
Patient had 2 family members with her overnight she appears, she has done well overnight with no issues.

## 2017-11-09 NOTE — Evaluation (Signed)
Physical Therapy Evaluation Patient Details Name: Shelley Mccoy MRN: [id number] DOB: August 15, 1971 Today's Date: 11/09/2017   History of Present Illness  47 y.o. female with a history of diabetes, hypertension who presents with speech abnormality and given IV tPA on 11/07/17. PMH including Bell's palsy (per pt report), diabetes, HTN, and heart murmur. MRI pending.   Clinical Impression  Pt presenting with R sided weakness, R facial drop, and expressive aphasia. Pt functioning at min guard level with RW. Pt with good home set up and support. Pt to benefit from Outpt PT to address mentioned deficits.    Follow Up Recommendations Outpatient PT;Supervision/Assistance - 24 hour    Equipment Recommendations  Rolling walker with 5" wheels    Recommendations for Other Services       Precautions / Restrictions Precautions Precautions: Fall Restrictions Weight Bearing Restrictions: No      Mobility  Bed Mobility Overal bed mobility: Modified Independent             General bed mobility comments: Increased time, HOB elevated, slighly impulsive and quick to move to EOB, used bed rail  Transfers Overall transfer level: Needs assistance Equipment used: None Transfers: Sit to/from Stand Sit to Stand: Min guard         General transfer comment: min guard for safety, pt with slight lean to the R  Ambulation/Gait Ambulation/Gait assistance: Min guard;Min assist Ambulation Distance (Feet): 180 Feet Assistive device: Rolling walker (2 wheeled);1 person hand held assist Gait Pattern/deviations: Step-through pattern;Decreased stride length;Antalgic Gait velocity: slow Gait velocity interpretation: Below normal speed for age/gender General Gait Details: pt first tried amb with L HHA, pt with R knee buckling and antalgic gait pattern, given RW, pt with increased stability and progressively less R knee buckling/weakness, Pt able to grip walker with R UE and manage it  safely  Stairs Stairs: Yes Stairs assistance: Min assist Stair Management: One rail Left;Step to pattern;Forwards Number of Stairs: 2(x4) General stair comments: v/c's to go up with the L and down with the R. step to pattern. minA for safety  Wheelchair Mobility    Modified Rankin (Stroke Patients Only) Modified Rankin (Stroke Patients Only) Pre-Morbid Rankin Score: No symptoms Modified Rankin: Moderate disability     Balance Overall balance assessment: Needs assistance Sitting-balance support: No upper extremity supported;Feet supported Sitting balance-Leahy Scale: Fair     Standing balance support: No upper extremity supported;During functional activity Standing balance-Leahy Scale: Fair                               Pertinent Vitals/Pain Pain Assessment: No/denies pain    Home Living Family/patient expects to be discharged to:: Private residence Living Arrangements: Spouse/significant other Available Help at Discharge: Family;Available 24 hours/day(boyfriend niece with also be there) Type of Home: Apartment Home Access: Stairs to enter Entrance Stairs-Rails: Can reach both   Home Layout: One level Home Equipment: None      Prior Function Level of Independence: Independent         Comments: works as a Programme researcher, broadcasting/film/video: Right    Extremity/Trunk Assessment   Upper Extremity Assessment Upper Extremity Assessment: Defer to OT evaluation    Lower Extremity Assessment Lower Extremity Assessment: RLE deficits/detail RLE Deficits / Details: grossly 4-/5       Communication   Communication: Expressive difficulties(garbled speech)  Cognition Arousal/Alertness: Awake/alert Behavior During Therapy: WFL for tasks assessed/performed Overall Cognitive  Status: Within Functional Limits for tasks assessed                                 General Comments: pt slightly impulsive      General Comments       Exercises     Assessment/Plan    PT Assessment Patient needs continued PT services  PT Problem List Decreased strength;Decreased range of motion;Decreased activity tolerance;Decreased balance;Decreased mobility;Decreased coordination;Decreased cognition;Decreased knowledge of use of DME;Decreased safety awareness       PT Treatment Interventions DME instruction;Gait training;Stair training;Functional mobility training;Therapeutic activities;Therapeutic exercise;Balance training;Neuromuscular re-education    PT Goals (Current goals can be found in the Care Plan section)  Acute Rehab PT Goals Patient Stated Goal: Go back to work PT Goal Formulation: With patient Time For Goal Achievement: 11/23/17 Potential to Achieve Goals: Good    Frequency Min 4X/week   Barriers to discharge        Co-evaluation               AM-PAC PT "6 Clicks" Daily Activity  Outcome Measure Difficulty turning over in bed (including adjusting bedclothes, sheets and blankets)?: None Difficulty moving from lying on back to sitting on the side of the bed? : None Difficulty sitting down on and standing up from a chair with arms (e.g., wheelchair, bedside commode, etc,.)?: A Little Help needed moving to and from a bed to chair (including a wheelchair)?: A Little Help needed walking in hospital room?: A Little Help needed climbing 3-5 steps with a railing? : A Little 6 Click Score: 20    End of Session Equipment Utilized During Treatment: Gait belt Activity Tolerance: Patient tolerated treatment well Patient left: in bed;with call bell/phone within reach;with family/visitor present(transportation came to move patient) Nurse Communication: Mobility status PT Visit Diagnosis: Unsteadiness on feet (R26.81);Difficulty in walking, not elsewhere classified (R26.2)    Time: [id number] PT Time Calculation (min) (ACUTE ONLY): 21 min   Charges:   PT Evaluation $PT Eval Moderate Complexity: 1 Mod     PT  G Codes:        Lewis ShockAshly Mariyam Remington, PT, DPT Pager #: [phone] Office #: [phone]   Zylen Wenig M Council [place] 11/09/2017, 1:33 PM

## 2017-11-09 NOTE — Progress Notes (Signed)
Echocardiogram 2D Echocardiogram has been performed.  11/09/2017 11:56 AM Gertie FeyMichelle Jennifermarie Franzen, BS, RVT, RDCS, RDMS

## 2017-11-23 ENCOUNTER — Ambulatory Visit: Payer: Managed Care, Other (non HMO) | Admitting: Occupational Therapy

## 2017-11-23 ENCOUNTER — Encounter: Payer: Self-pay | Admitting: Occupational Therapy

## 2017-11-23 ENCOUNTER — Encounter: Payer: Self-pay | Admitting: Physical Therapy

## 2017-11-23 ENCOUNTER — Ambulatory Visit: Payer: Managed Care, Other (non HMO) | Admitting: Speech Pathology

## 2017-11-23 ENCOUNTER — Encounter: Payer: Self-pay | Admitting: Speech Pathology

## 2017-11-23 ENCOUNTER — Ambulatory Visit: Payer: Managed Care, Other (non HMO) | Attending: Neurology | Admitting: Physical Therapy

## 2017-11-23 DIAGNOSIS — R2681 Unsteadiness on feet: Secondary | ICD-10-CM

## 2017-11-23 DIAGNOSIS — R278 Other lack of coordination: Secondary | ICD-10-CM | POA: Insufficient documentation

## 2017-11-23 DIAGNOSIS — M6281 Muscle weakness (generalized): Secondary | ICD-10-CM

## 2017-11-23 DIAGNOSIS — R2689 Other abnormalities of gait and mobility: Secondary | ICD-10-CM

## 2017-11-23 DIAGNOSIS — R41844 Frontal lobe and executive function deficit: Secondary | ICD-10-CM

## 2017-11-23 DIAGNOSIS — R471 Dysarthria and anarthria: Secondary | ICD-10-CM | POA: Insufficient documentation

## 2017-11-23 NOTE — Therapy (Signed)
[facility] [address] [place], [place], [address] Phone: [phone]   Fax:  [phone]  Speech Language Pathology Evaluation  Patient Details  Name: Shelley Mccoy MRN: [id number] Date of Birth: 1971-04-26 Referring Provider: Dr. Marvel Mccoy   Encounter Date: 11/23/2017  End of Session - 11/23/17 1152    Visit Number  1    Number of Visits  13    Date for SLP Re-Evaluation  01/04/18    SLP Start Time  1102    SLP Stop Time   1143    SLP Time Calculation (min)  41 min    Activity Tolerance  Patient tolerated treatment well       Past Medical History:  Diagnosis Date  . Anemia   . Asthma   . Diabetes mellitus without complication (HCC)   . Heart murmur   . History of uterine fibroid   . HSV-2 (herpes simplex virus 2) infection   . Hypertension   . Seasonal allergies     Past Surgical History:  Procedure Laterality Date  . CYSTECTOMY     hand and breast  . LAPAROSCOPIC ABDOMINAL EXPLORATION    . TOTAL ABDOMINAL HYSTERECTOMY      There were no vitals filed for this visit.      SLP Evaluation [facility] - 11/23/17 1113      SLP Visit Information   SLP Received On  11/23/17    Referring Provider  Dr. Marvel Mccoy    Onset Date  11/07/17    Medical Diagnosis  stroke like symptoms      Subjective   Subjective  "I just want my speech to come back to normal"    Patient/Family Stated Goal  To get my speech back right"      Pain Assessment   Currently in Pain?  Yes    Pain Score  5     Pain Location  Leg    Pain Orientation  Right    Pain Type  Acute pain    Pain Onset  1 to 4 weeks ago    Pain Frequency  Intermittent    Pain Relieving Factors  exercise    Effect of Pain on Daily Activities  none      General Information   HPI  47 y.o.femalewith history of DM, HTN, HSV-2 infection, anemiaadmitted for right facial droop and dysarthria.TPA givenaround 3pm 2/10.  Pt failed RN stroke swallow  screen.      Behavioral/Cognition  WFL      Balance Screen   Has the patient fallen in the past 6 months  No    Has the patient had a decrease in activity level because of a fear of falling?   No    Is the patient reluctant to leave their home because of a fear of falling?   No      Prior Functional Status   Cognitive/Linguistic Baseline  Within functional limits    Type of Home  Apartment     Lives With  Other (Comment) friend    Available Support  Friend(s)    Vocation  Full time employment      Cognition   Overall Cognitive Status  Within Functional Limits for tasks assessed      Auditory Comprehension   Overall Auditory Comprehension  Appears within functional limits for tasks assessed      Visual Recognition/Discrimination   Discrimination  Not tested      Reading Comprehension  Reading Status  Within funtional limits      Expression   Primary Mode of Expression  Verbal      Verbal Expression   Overall Verbal Expression  Appears within functional limits for tasks assessed      Oral Motor/Sensory Function   Overall Oral Motor/Sensory Function  Appears within functional limits for tasks assessed    Labial ROM  Within Functional Limits    Labial Coordination  WFL    Lingual Coordination  WFL    Velum  Within Functional Limits    Overall Oral Motor/Sensory Function  Pt with tongue thrust, eliminated with direct instructions to keep tongue behind her teeth      Motor Speech   Overall Motor Speech  Appears within functional limits for tasks assessed    Respiration  Within functional limits    Phonation  Normal    Resonance  Within functional limits    Articulation  Impaired    Level of Impairment  Phrase    Intelligibility  Intelligibility reduced    Word  75-100% accurate    Phrase  75-100% accurate    Sentence  75-100% accurate    Conversation  75-100% accurate    Motor Planning  Witnin functional limits    Effective Techniques  Slow rate;Over-articulate;Pause                       SLP Education - 11/23/17 1150    Education provided  Yes    Education Details  compensations for dysarthria. HEP for dysarthria    Person(s) Educated  Patient    Methods  Explanation;Demonstration;Handout;Verbal cues    Comprehension  Verbalized understanding;Returned demonstration;Verbal cues required         SLP Long Term Goals - 11/23/17 1153      SLP LONG TERM GOAL #1   Title  Pt will complete HEP for dysarthria with rare min A    Time  6    Period  Weeks    Status  New      SLP LONG TERM GOAL #2   Title  Pt will utilize compensations for dysarthria in simple conversation over 8 minutes with rare min a    Time  6    Period  Weeks    Status  New      SLP LONG TERM GOAL #3   Title  Pt will be 100% intelligible in noisy environment in conversation for 15 minutes with rare min A    Time  6    Period  Weeks    Status  New       Mccoy - 11/23/17 1158    Clinical Impression Statement  Shelley Mccoy 47 y.o. female is referred for outpt ST by neurologist due to slurred speech. 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 Today she presents with mild dysarthria characterized by slight linguial protusion with speech, with dental stops, and fricatives /s/ and /s/ primarily, as well as w/r substition inconsistently. Lingual protrusion varies from every word in utterance to just a few sounds in an utterance. Pt was stimulable for correct lingual placement in alveolar ridge in isolation and cv syllable level for /t/ and /s/. With mod A, she utilized over ennunciation on oral reading phrases and tongue twisters with accurate/correct articulation. Pt and her friend, Shelley Mccoy were trained in HEP for dysarthria including mirror for correct lingual placement. In conversation, pt was intelligible in this quiet  room.I recommend skilled ST to maximize intelligibilty across community and work settings.      Speech Therapy Frequency  2x / week       Patient will benefit from skilled therapeutic intervention in order to improve the following deficits and impairments:   Dysarthria and anarthria - Mccoy: SLP Mccoy of care cert/re-cert    Problem List Patient Active Problem List   Diagnosis Date Noted  . 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

## 2017-11-23 NOTE — Therapy (Signed)
[facility] [address] [place], [place], [address] Phone: [phone]   Fax:  [phone]  Occupational Therapy Evaluation  Patient Details  Name: Shelley Mccoy MRN: [id number] Date of Birth: 03-22-1971 Referring Provider: Dr. Marvel Plan   Encounter Date: 11/23/2017  OT End of Session - 11/23/17 1044    Visit Number  1    Number of Visits  17    Date for OT Re-Evaluation  01/22/18    Authorization Type  awaiting insurance verification ?[employer]    OT Start Time  [phone]    OT Stop Time  0930    OT Time Calculation (min)  41 min    Activity Tolerance  Patient tolerated treatment well    Behavior During Therapy  Flat affect       Past Medical History:  Diagnosis Date  . Anemia   . Asthma   . Diabetes mellitus without complication (HCC)   . Heart murmur   . History of uterine fibroid   . HSV-2 (herpes simplex virus 2) infection   . Hypertension   . Seasonal allergies     Past Surgical History:  Procedure Laterality Date  . CYSTECTOMY     hand and breast  . LAPAROSCOPIC ABDOMINAL EXPLORATION    . TOTAL ABDOMINAL HYSTERECTOMY      There were no vitals filed for this visit.  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  "Should I apply for disability?"  "They think it is from stress"    Pertinent History  Stroke-like symptoms, MRI negative, probable conversion disorder 11/07/17; anemia, asthma, DM, HTN, hyperlipidemia, hx of Bells palsy per pt report (but speech changes returned to baseline quickly per pt report)      Limitations  fall risk    Patient Stated Goals  to get better    Currently in Pain?  Yes    Pain Score  6     Pain Location  Shoulder    Pain Orientation  Right    Pain Descriptors / Indicators  Dull    Pain Type  Acute pain    Pain Onset  1 to 4 weeks ago    Aggravating Factors   bringing arm into ER/abduction     Pain Relieving Factors  rest        [facility] OT Assessment - 11/23/17 0853      Assessment   Medical Diagnosis  stroke-like symptoms    Referring Provider  Dr. 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)    Leisure  Takes pride in cleaning her house      ADL   Eating/Feeding  Modified independent 90% of the time [employer]  Modified independent 90% RUE    Upper Body Bathing  Modified independent    Lower Body Bathing  Modified independent    Upper Body Dressing  -- mod I    Lower Body Dressing  Modified independent    Toilet Transfer  Modified independent    Toileting - Clothing Manipulation  Modified independent    Toileting -  Dentist  -- tub/shower combo    Transfers/Ambulation Related to ADL's  mod I      IADL   Shopping  Needs to be accompanied on any shopping trip    Prior Level of Function Light Housekeeping  pt did all cleaning prior    Light Housekeeping  -- performing light tasks with fatigue    Prior Level of Function Meal Prep  pt did all cooking prior    Meal Prep  -- simple tasks with fatigue, not using knife, using L hand    Prior Level of Function Community Mobility  independent    Community Mobility  Drives own vehicle    Medication Management  Is responsible for taking medication in correct dosages at correct time      Mobility   Mobility Status  Independent    Mobility  Status Comments  Uses RW in the home but worked yesterday without it      Written Expression   Dominant Hand  Right    Handwriting  -- pt denies difficulty      Vision Assessment   Vision Assessment  Vision not tested [facility] per pt/hospital notes      Activity Tolerance   Activity Tolerance Comments  fatigues quickly with IADLs      Cognition   Overall Cognitive Status  Cognition to be further assessed in functional context PRN pt denies changes    Awareness  Impaired    Awareness Impairment  Emergent impairment decr safety awareness    Behaviors  -- inconsistent presentation of symptoms      Sensation   Light Touch  Appears Intact pt denies numbness/tingling      Coordination   Coordination and Movement Description  slowed movement RUE and at times BUEs     9 Hole Peg Test  Left;Right    Right 9 Hole Peg Test  33.85, 28.0sec    Left 9 Hole Peg Test  21.06      ROM / Strength   AROM / PROM / Strength  AROM;Strength      AROM   Overall AROM   Within functional limits for tasks performed    Overall AROM Comments  however slower movements with BUEs and abduction/ER inconsistent (limited when pt first attempted, but later observed to demo full ROM)      Strength   Overall Strength  Deficits    Overall Strength Comments  UE strength testing inconsistent with improved strength noted when testing BUEs at same time vs. RUE in isolation    Strength Assessment Site  Shoulder;Elbow    Right/Left Shoulder  Right    Right Shoulder Flexion  3+/5    Right Shoulder ABduction  3+/5    Right Shoulder Horizontal ABduction  4-/5    Right Shoulder Horizontal ADduction  3+/5    Right/Left Elbow  Right    Right Elbow Flexion  4-/5    Right Elbow Extension  3+/5      Hand Function   Right Hand Grip (lbs)  5, 10, 10lbs     Left Hand Grip (lbs)  40lbs x 3                      OT Education - 11/23/17 1037    Education provided  Yes    Education Details  Recommended against  driving due to slowed movements/reaction time, particularly with RLE; Also expressed concerns with safety working as CNA with balance difficulties and weakness (pt reports receiving assistance for transferring pt); Recommended pt discuss work/restrictions with MD and schedule appt ASAP; OT eval results/POC    Person(s) Educated  Patient    Methods  Explanation    Comprehension  Verbalized understanding       OT Short Term Goals - 11/23/17 1054      OT SHORT TERM GOAL #1   Title  Pt will be independent with initial HEP.--check STGs 12/22/17    Time  4    Period  Weeks    Status  New      OT SHORT TERM GOAL #2   Title  Pt will perform BADLs using RUE as dominant UE 100% of the time.    Time  4    Period  Weeks    Status  New      OT SHORT TERM GOAL #3   Title  Pt will perform mod complex cleaning tasks for at least 30min without rest breaks and using good safety.    Time  4    Period  Weeks    Status  New      OT SHORT TERM GOAL #4   Title  Pt will improve dominant RUE coordination for ADLs as shown by completing 9-hole peg test in 22sec or less.    Baseline  28sec    Time  4    Period  Weeks    Status  New      OT SHORT TERM GOAL #5   Title  Pt will improve R grip strength for ADLs to at least 20lbs over 3 trials.    Baseline  5, 10, 10lbs    Time  4    Period  Weeks    Status  New      OT SHORT TERM GOAL #6   Title  --    Baseline  --    Time  --    Period  --    Status  --        OT Long Term Goals - 11/23/17 1057      OT LONG TERM GOAL #1   Title  Pt will be independent with updated HEP.--check LTGs 01/22/18    Time  8    Period  Weeks    Status  New      OT LONG TERM GOAL #2   Title  Pt will perform mod complex cooking tasks with good safety.    Time  8    Period  Weeks    Status  New      OT LONG TERM GOAL #3   Title  Pt will improve R grip strength for ADLs to at least 30lbs over 3 trials.    Baseline  5, 10, 10lbs    Time  8    Period  Weeks     Status  New      OT LONG TERM GOAL #4   Title  Pt will be able to lift/move at least 40lbs with BUEs utilizing good body  mechanics/safety for work tasks.    Time  8    Period  Weeks    Status  New      OT LONG TERM GOAL #5   Title  Pt will report RUE pain less than or equal to 2/10 for ADLs.    Time  8    Period  Weeks    Status  New            Plan - 11/23/17 1045    Clinical Impression Statement  Pt is a 47 y.o. female with recent hospitalization 11/07/17-11/09/17 for stroke-like symptoms with TPA given.  MRI was negative and Epic notes indicate probable conversion disorder.  Pt with PMH that includes:  anemia, asthma, DM, HTN, hyperlipidemia, hx of Bells palsy per pt report (but speech changes returned to baseline quickly per pt report).  Pt presents today with R-sided weakness, decr coordination, incr time for movements/response, cognitive/safety deficits, and decr balance/functional mobility.  Pt would benefit from occupational therapy to address these deficits for incr safety/independence with ADLs/IADLs, improved dominant RUE functional use, and ability to perform work tasks safely.    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  Pt now reports difficulty and fatigue with dominant RUE functional use and performing household tasks.  Pt reports that she has driven and returned to work yesterday; however, question pt safety of doing so.      Occupational performance deficits (Please refer to evaluation for details):  ADL's;IADL's;Work;Leisure    Rehab Potential  Good    OT Frequency  2x / week    OT Duration  8 weeks +eval    OT Treatment/Interventions  Self-care/ADL training;Therapeutic exercise;Patient/family education;Neuromuscular education;Moist Heat;Functional Development worker, community;Therapeutic activities;Balance training;Cognitive remediation/compensation;Manual  Therapy;DME and/or AE instruction;Cryotherapy    Plan  initiate HEP for RUE strength and coordination    Clinical Decision Making  Limited treatment options, no task modification necessary    Recommended Other Services  current with PT; scheduled for ST eval    Consulted and Agree with Plan of Care  Patient       Patient will benefit from skilled therapeutic intervention in order to improve the following deficits and impairments:  Decreased cognition, Decreased knowledge of use of DME, Pain, Decreased mobility, Decreased coordination, Decreased activity tolerance, Decreased endurance, Decreased strength, Impaired tone, Impaired UE functional use, Impaired perceived functional ability, Decreased safety awareness, Decreased balance, Decreased knowledge of precautions  Visit Diagnosis: Muscle weakness (generalized)  Frontal lobe and executive function deficit  Other lack of coordination  Other abnormalities of gait and mobility  Unsteadiness on feet    Problem List Patient Active Problem List   Diagnosis Date Noted  . 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 [place], [place], [address] Phone: [phone]   Fax:  [phone]  Name: Shelley Mccoy MRN: [id number] Date of Birth: Sep 18, 1971   Willa Frater, OTR/L [facility] [address] [place], [place]  [address] [phone] phone [phone] 11/23/17 11:05 AM

## 2017-11-23 NOTE — Therapy (Signed)
[facility] [address] [place], [place], [address] Phone: [phone]   Fax:  [phone]  Physical Therapy Evaluation  Patient Details  Name: Shelley Mccoy MRN: [id number] Date of Birth: [birth year] Referring Provider: Dr. Marvel Plan   Encounter Date: 11/23/2017  PT End of Session - 11/23/17 2000    Visit Number  1    Number of Visits  17    Date for PT Re-Evaluation  01/22/18    Authorization Type  [employer]-awaiting insurance verification    PT Start Time  0806    PT Stop Time  0846    PT Time Calculation (min)  40 min    Equipment Utilized During Treatment  Gait belt    Activity Tolerance  Patient tolerated treatment well    Behavior During Therapy  Flat affect       Past Medical History:  Diagnosis Date  . Anemia   . Asthma   . Diabetes mellitus without complication (HCC)   . Heart murmur   . History of uterine fibroid   . HSV-2 (herpes simplex virus 2) infection   . Hypertension   . Seasonal allergies     Past Surgical History:  Procedure Laterality Date  . CYSTECTOMY     hand and breast  . LAPAROSCOPIC ABDOMINAL EXPLORATION    . TOTAL ABDOMINAL HYSTERECTOMY      There were no vitals filed for this visit.   Subjective Assessment - 11/23/17 0808    Subjective  Pt reports getting sick at work, "mouth got twisted and R side became weaker than L."  Pt presented to hospital on 11/09/17 and was discharged home several days later per patient report.  Just started back to work yesterday.  No reported falls.  It seems like sometimes I may trip on R side.    Patient Stated Goals  Pt's goals for therapy is to get better, back to normal.    Currently in Pain?  No/denies         [facility] PT Assessment - 11/23/17 1324      Assessment   Medical Diagnosis  stroke-like symptoms    Referring Provider  Marvel Plan, MD    Onset Date/Surgical Date  11/09/17      Precautions   Precautions  Fall      Balance  Screen   Has the patient fallen in the past 6 months  No    Has the patient had a decrease in activity level because of a fear of falling?   No    Is the patient reluctant to leave their home because of a fear of falling?   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    Leisure  [place]  pride in cleaning her house      Cognition   Overall Cognitive Status  -- Speech changes      Observation/Other Assessments   Focus on Therapeutic Outcomes (FOTO)   NA      ROM / Strength   AROM / PROM / Strength  Strength      Strength   Overall Strength  Deficits    Strength Assessment Site  Hip;Knee;Ankle    Right/Left Hip  Right;Left    Right Hip Flexion  3+/5    Left Hip Flexion  4/5    Right/Left Knee  Right;Left    Right Knee Flexion  4/5    Right Knee Extension  3+/5    Left Knee Flexion  5/5    Left Knee Extension  5/5    Right/Left Ankle  Right;Left    Right Ankle Dorsiflexion  3/5    Right Ankle Inversion  3+/5    Right Ankle Eversion  3+/5    Left Ankle Dorsiflexion  5/5      Transfers   Transfers  Sit to Stand;Stand to Sit    Sit to Stand  6: Modified independent (Device/Increase time);With upper extremity assist;From chair/3-in-1    Stand to Sit  6: Modified independent (Device/Increase time);Without upper extremity assist;To chair/3-in-1      Ambulation/Gait   Ambulation/Gait  Yes    Ambulation/Gait Assistance   5: Supervision    Ambulation/Gait Assistance Details  occasional R foot drag    Ambulation Distance (Feet)  200 Feet    Assistive device  None    Gait Pattern  Step-through pattern;Decreased step length - right;Decreased dorsiflexion - right;Trendelenburg;Lateral trunk lean to left;Poor foot clearance - right    Ambulation Surface  Level;Unlevel    Gait velocity  13.21 sec = 2.48 ft/sec      Standardized Balance Assessment   Standardized Balance Assessment  Dynamic Gait Index;Timed Up and Go Test      Dynamic Gait Index   Level Surface  Moderate Impairment    Change in Gait Speed  Mild Impairment    Gait with Horizontal Head Turns  Moderate Impairment    Gait with Vertical Head Turns  Moderate Impairment    Gait and Pivot Turn  Mild Impairment    Step Over Obstacle  Moderate Impairment    Step Around Obstacles  Mild Impairment    Steps  Moderate Impairment    Total Score  11    DGI comment:  Scores <19/24 indicate increased fall risk.      Timed Up and Go Test   Normal TUG (seconds)  13.31    TUG Comments  Scores >13.5 sec indicates increased fall risk.             Objective measurements completed on examination: See above findings.          Therapeutic Exercise: Ankle pumps, seated, x 10 reps, then sit<>stand x 3 reps with cues for equal foot placement.  Provided handout for exercises to initiate lower extremity strengthening as part of HEP.     PT Education - 11/23/17 1959    Education provided  Yes    Education Details  Initiated HEP for lower extremity strengthening    Person(s) Educated  Patient    Methods  Explanation;Demonstration;Handout    Comprehension  Verbalized understanding;Returned demonstration;Verbal cues required       PT Short Term Goals - 11/23/17 2008      PT SHORT TERM GOAL #1  Title  Pt will be independent with HEP for improved strength, balance, and gait.  TARGET 12/17/17    Time  4    Period  Weeks    Status  New    Target Date   12/17/17      PT SHORT TERM GOAL #2   Title  Pt will perform at least 8 of 10 reps of sit<>stand transfers without UE support for improved lower extremity strength and transfer ability.    Time  4    Period  Weeks    Status  New    Target Date  12/17/17      PT SHORT TERM GOAL #3   Title  Pt will improve DGI score to at least 15/24 for decreased fall risk.    Time  4    Period  Weeks    Status  New    Target Date  12/17/17      PT SHORT TERM GOAL #4   Title  Pt will negotiate at least 4 steps with one handrail step-through pattern, modified independently.    Time  4    Period  Weeks    Status  New    Target Date  12/17/17        PT Long Term Goals - 11/23/17 2010      PT LONG TERM GOAL #1   Title  Pt will verbalize understanding of fall prevention in home environment.  TARGET 01/21/18    Time  9    Period  Weeks    Status  New    Target Date  01/21/18      PT LONG TERM GOAL #2   Title  Pt will improve gait velocity to at least 2.62 ft/sec for improved gait efficiency and safety.    Time  9    Period  Weeks    Status  New    Target Date  01/21/18      PT LONG TERM GOAL #3   Title  Pt will improve DGI score to at least 19/24 for decreased fall risk.    Time  9    Period  Weeks    Status  New    Target Date  01/21/18      PT LONG TERM GOAL #4   Title  Pt will ambulate at least 1000 ft, indoor and outdoor surfaces, independently, for improved community gait.    Time  9    Period  Weeks    Status  New    Target Date  01/21/18             Plan - 11/23/17 2001    Clinical Impression Statement  Pt is a 47 year old who presents to OP PT with stroke-like symptoms.  She was given tPA upon arrival at hospital, but according to Epic notes, no changes on MRI/testing, indicating possible coversion disorder.  She has history of HTN, obesity, anemia, asthma, DM.  She presents to PT today with RLE weakness, decreased balance, decreased independence and safety with gait,  and she is at fall risk per DGI score.  She would benefit from skilled PT to address the above stated deficits to decrease fall risk and improve functional mobility and independence.    History and Personal Factors relevant to plan of care:  PMH >3 co-morbidities, Independent PTA    Clinical Presentation  Stable    Clinical Presentation due to:  hx of stroke like symptoms, ?conversion disorder; fall risk per  DGI score    Clinical Decision Making  Low    Rehab Potential  Good    Clinical Impairments Affecting Rehab Potential  ?conversion disorder     PT Frequency  2x / week    PT Duration  8 weeks plus eval; though pt feels she may only be able to schedule 1x/wk    PT Treatment/Interventions  ADLs/Self Care Home Management;Balance training;DME Instruction;Therapeutic exercise;Therapeutic activities;Functional mobility training;Gait training;Stair training;Neuromuscular re-education;Patient/family education    PT Next Visit Plan  Review HEP and add to HEP for RLE strengthening and balance    Consulted and Agree with Plan of Care  Patient       Patient will benefit from skilled therapeutic intervention in order to improve the following deficits and impairments:  Abnormal gait, Decreased balance, Decreased mobility, Decreased strength, Difficulty walking  Visit Diagnosis: Other abnormalities of gait and mobility  Muscle weakness (generalized)  Unsteadiness on feet     Problem List Patient Active Problem List   Diagnosis Date Noted  . 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 11/23/2017, 8:14 PM Gean Maidens., PT  [facility] [address] [place], [place], [address] Phone: [phone]   Fax:  [phone]  Name: Shelley Mccoy MRN: [id number] Date of Birth: 1971/01/29

## 2017-11-23 NOTE — Patient Instructions (Addendum)
ANKLE: Pumps    Point toes down, then up. _10_ reps per set, _2-3__ sets per day   Copyright  VHI. All rights reserved.  Functional Quadriceps: Sit to Stand    Sit on edge of chair, feet flat on floor. Stand upright, extending knees fully. Repeat __5-10__ times per set.  Do _1-2___ sessions per day.  http://orth.exer.us/735   Copyright  VHI. All rights reserved.

## 2017-11-23 NOTE — Patient Instructions (Signed)
    SLOW LOUD OVER-ENNUNCIATE PAUSE  PA TA KA  PATA TAKA KAPA PATAKA  BUTTERCUP  CATERPILLAR  BASEBALLL PLAYER  [place]  [place] BUCCANEERS  SLOW AND BIG - EXAGGERATE YOUR MOUTH, MAKE EACH CONSONANT  Do exercises 20x twice a day  Open you mouth, put your tongue behind your teeth and lick the roof of your mouth back to your throat  Click your tongue on the roof of your mouth  Move your tongue in a circle clock wise 20x then counterclock wise 20x around your teeth inside your lips  Speech exercises - do 5x each, x2-3/day SLOW BIG  SAY THE FOLLOWING- make every sound! Red leather, yellow leather  Big grocery buggy    Purple baby carriage    [facility] Bu[facility]ane[facility] Proper copper coffee pot Ripe purple cabbage Three free throws [employer] Terrapins Red Bulb, Blue Bulb Flash Message Dave dipped the dessert  [employer] Devils An Art gallery managerngineer for [employer] Five valve levers Six Thick Thistles Stick Double Bubble Gum Fat cows give milk Automatic DataMinnesota Golden Gophers Fat frogs flip freely TXU Corpuck Tommy into bed Get that game to The [place] Travelersreg Freshly Fried Fat Fish Cinnamon aluminum linoleum Black bugs blood Lovely lemon linament Buckle that Air traffic controllerbracket  Tying Tape Takes Time A Shifty Salt Shaker   The gospel of Mark Shirts shrink, shells shouldn't MetalineSan [place] 49ers Take the tackle box Give me five flapjacks Fundamental relatives Call the cat "Buttercup" A calendar of [place] Four floors to cover Yellow oil ointment Fellow lovers of felines Catastrophe in [place] Plump plumbers' plums The church's chimes chimed Telling time until eleven Unique [place] A Three Toed Tree Toad Knapsack Strap Snap Rubber [address] StBaby Buggy Bumpers

## 2017-12-01 ENCOUNTER — Ambulatory Visit (INDEPENDENT_AMBULATORY_CARE_PROVIDER_SITE_OTHER): Payer: Managed Care, Other (non HMO) | Admitting: Family Medicine

## 2017-12-01 ENCOUNTER — Encounter: Payer: Self-pay | Admitting: Family Medicine

## 2017-12-01 VITALS — BP 129/73 | HR 64 | Temp 97.9°F | Resp 16 | Ht 60.0 in | Wt 218.0 lb

## 2017-12-01 DIAGNOSIS — R82998 Other abnormal findings in urine: Secondary | ICD-10-CM

## 2017-12-01 DIAGNOSIS — E119 Type 2 diabetes mellitus without complications: Secondary | ICD-10-CM | POA: Insufficient documentation

## 2017-12-01 DIAGNOSIS — I10 Essential (primary) hypertension: Secondary | ICD-10-CM

## 2017-12-01 DIAGNOSIS — F32A Depression, unspecified: Secondary | ICD-10-CM

## 2017-12-01 DIAGNOSIS — F329 Major depressive disorder, single episode, unspecified: Secondary | ICD-10-CM

## 2017-12-01 DIAGNOSIS — R011 Cardiac murmur, unspecified: Secondary | ICD-10-CM

## 2017-12-01 DIAGNOSIS — E785 Hyperlipidemia, unspecified: Secondary | ICD-10-CM

## 2017-12-01 DIAGNOSIS — J4520 Mild intermittent asthma, uncomplicated: Secondary | ICD-10-CM

## 2017-12-01 DIAGNOSIS — N898 Other specified noninflammatory disorders of vagina: Secondary | ICD-10-CM

## 2017-12-01 DIAGNOSIS — Z6841 Body Mass Index (BMI) 40.0 and over, adult: Secondary | ICD-10-CM

## 2017-12-01 DIAGNOSIS — B009 Herpesviral infection, unspecified: Secondary | ICD-10-CM

## 2017-12-01 LAB — POCT URINALYSIS DIP (DEVICE)
Bilirubin Urine: NEGATIVE
Glucose, UA: NEGATIVE mg/dL
Hgb urine dipstick: NEGATIVE
Ketones, ur: NEGATIVE mg/dL
Nitrite: NEGATIVE
Protein, ur: NEGATIVE mg/dL
Specific Gravity, Urine: 1.015 (ref 1.005–1.030)
Urobilinogen, UA: 1 mg/dL (ref 0.0–1.0)
pH: 6.5 (ref 5.0–8.0)

## 2017-12-01 MED ORDER — VALACYCLOVIR HCL 500 MG PO TABS: 500.0000 mg | ORAL_TABLET | Freq: Every day | ORAL | 5 refills | Status: DC

## 2017-12-01 NOTE — Progress Notes (Signed)
Subjective:    Patient ID: Shelley Mccoy, female    DOB: Feb 25, 1971, 47 y.o.   MRN: [id number]  HPI  A 47 year old female with a history of diabetes type 2 mellitus, hypertension, hyperlipidemia, and right-sided weakness.  Patient recently relocated to this area from [facility].  Patient was recently admitted to inpatient services on 11/07/2017 for right facial drooping and right-sided weakness.  Patient was in usual state of health until sudden onset of symptoms.  According to neurology, symptoms were related to conversion disorder I will unrelated to stroke.  MRI and head CT were both negative.  Patient also had a 2D echocardiogram which showed an ejection fraction of 60-65%, no cardiac source of symptoms.  Patient has a history of type 2 diabetes mellitus, hemoglobin A1c was 6.4 during inpatient admission.  Lantus was discontinued at that time.  Patient was discharged with ongoing aggressive stroke risk factor management.  Also patient was referred to outpatient physical therapy and occupational therapy for right-sided weakness.  Patient establish care with physical therapy 1week.  She endorses ongoing right-sided weakness.  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  She states that she has been unable to perform her job effectively. She denies headache, dizziness, chest pain, shortness of breath, dysuria, nausea, vomiting, or diarrhea.   Past Medical History:  Diagnosis Date  . Anemia   . Asthma   . Diabetes mellitus without complication (HCC)   . Heart murmur   . History of uterine fibroid   . HSV-2 (herpes simplex virus 2) infection   . Hypertension   . Seasonal allergies    There is no immunization history on file for this patient.  Social History   Socioeconomic History  . Marital status: Single    Spouse name: Not on file  . Number of children: Not on file  . Years of education: Not on file  . Highest education level: Not on file    Social Needs  . Financial resource strain: Not on file  . Food insecurity - worry: Not on file  . Food insecurity - inability: Not on file  . Transportation needs - medical: Not on file  . Transportation needs - non-medical: Not on file  Occupational History  . Not on file  Tobacco Use  . Smoking status: Not on file  Substance and Sexual Activity  . Alcohol use: Not on file  . Drug use: Not on file  . Sexual activity: Not on file  Other Topics Concern  . Not on file  Social History Narrative  . Not on file   Allergies  Allergen Reactions  . Percocet [Oxycodone-Acetaminophen] Hives   Allergies as of 12/01/2017      Reactions   Percocet [oxycodone-acetaminophen] Hives      Medication List        Accurate as of 12/01/17  9:09 AM. Always use your most recent med list.          ALLER-EASE 180 MG tablet Generic drug:  fexofenadine Take 180 mg by mouth daily.   estradiol 0.1 MG/GM vaginal cream Commonly known as:  ESTRACE Apply 1 gram per vagina every night for 2 weeks, then apply three times a week   fluticasone-salmeterol 115-21 MCG/ACT inhaler Commonly known as:  ADVAIR HFA Inhale 2 puffs into the lungs 2 (two) times daily.   gabapentin 300 MG capsule Commonly known as:  NEURONTIN Take 300 mg by mouth 3 (three) times daily.  glipiZIDE 10 MG 24 hr tablet Commonly known as:  GLUCOTROL XL Take 10 mg by mouth daily with breakfast.   hydrochlorothiazide 12.5 MG tablet Commonly known as:  HYDRODIURIL Take 12.5 mg by mouth daily.   losartan 50 MG tablet Commonly known as:  COZAAR Take 50 mg by mouth daily.   pravastatin 20 MG tablet Commonly known as:  PRAVACHOL Take 20 mg by mouth daily.   sertraline 50 MG tablet Commonly known as:  ZOLOFT Take 50 mg by mouth daily.   valACYclovir 1000 MG tablet Commonly known as:  VALTREX Take 1,000 mg by mouth 2 (two) times daily.   VENTOLIN HFA 108 (90 Base) MCG/ACT inhaler Generic drug:  albuterol Inhale into the  lungs every 6 (six) hours as needed for wheezing or shortness of breath.   verapamil 40 MG tablet Commonly known as:  CALAN Take 40 mg by mouth daily.       Review of Systems  Constitutional: Negative for fatigue and fever. Unexpected weight change: weight gain.  HENT: Negative.   Eyes: Negative for itching.  Respiratory: Negative for shortness of breath.   Cardiovascular: Negative for chest pain, palpitations and leg swelling.  Gastrointestinal: Negative.   Genitourinary:       Vaginal lesion  Musculoskeletal: Negative.   Skin: Negative.   Neurological: Negative for dizziness, tremors, facial asymmetry, weakness, light-headedness, numbness and headaches.       Right-sided weakness  Psychiatric/Behavioral: Negative.        Depression        Objective:   Physical Exam  Constitutional: She is oriented to person, place, and time.  Eyes: Conjunctivae and EOM are normal. Pupils are equal, round, and reactive to light.  Cardiovascular:  Murmur heard.  Systolic murmur is present. Pulmonary/Chest: Effort normal and breath sounds normal. She has no wheezes. She has no rales. She exhibits no tenderness.  Abdominal: Soft. Bowel sounds are normal.  Genitourinary:    There is lesion on the right labia.  Musculoskeletal:       Right shoulder: She exhibits decreased range of motion.  Neurological: She is alert and oriented to person, place, and time. She has normal reflexes. No cranial nerve deficit or sensory deficit. Coordination abnormal. Gait normal.  Skin: Skin is warm and dry.  Psychiatric: She has a normal mood and affect. Her behavior is normal. Judgment and thought content normal.         BP 129/73 (BP Location: Right Arm, Patient Position: Sitting, Cuff Size: Large)   Pulse 64   Temp 97.9 F (36.6 C) (Oral)   Resp 16   Ht 5' (1.524 m)   Wt 218 lb (98.9 kg)   SpO2 99%   BMI 42.58 kg/m  Assessment & Plan:  1. Type 2 diabetes mellitus without complication,  without long-term current use of insulin (HCC) Previous hemoglobin A1c is 6.4 which is at goal.  Will continue glipizide 10 mg daily. Also recommend a carbohydrate modify diet.  Discussed diet and exercise regimen at length.  2. Essential hypertension Blood pressure is at goal on current medication regimen, no changes warranted on today. - Continue medication, monitor blood pressure at home. Continue DASH diet. Reminder to go to the ER if any CP, SOB, nausea, dizziness, severe HA, changes vision/speech, left arm numbness and tingling and jaw pain.     3. Hyperlipidemia LDL goal <100 Reviewed recent lipid panel, cholesterol at goal.  Will continue daily statin and ASA therapy as stroke preventative.  4.  Morbid obesity with BMI of 40.0-44.9, adult [facility]) The patient is asked to make an attempt to improve diet and exercise patterns to aid in management of this problem.   5. Mild intermittent asthma, unspecified whether complicated We will continue Advair as previously prescribed.  Also continue albuterol rescue inhaler every 6 hours as needed 2 puffs 4 shortness of breath, wheezing, or persistent coughing.  6. Heart murmur Reviewed 2D echocardiogram.  Ejection fraction 60-65%  7. Vaginal odor - Vaginitis/Vaginosis, DNA Probe  8. Herpes simplex type 2 infection - valACYclovir (VALTREX) 500 MG tablet; Take 1 tablet (500 mg total) by mouth daily.  Dispense: 30 tablet; Refill: 5  9. Urine leukocytes - Urine Culture   RTC: 3 months for chronic conditions  Nolon Nations  MSN, FNP-C Patient Care [facility] [address] [place], [place] [address] [phone]

## 2017-12-01 NOTE — Patient Instructions (Signed)
Recommend Dove unscented body wash for dry skin. Also, hypoallergenic Aveeno body lotion. Recommend hypoallergenic laundry detergent.  Vaseline is hypoallergenic, you can use it liberally throughout the day for dry skin.  Will start a trial of ganciclovir 500 mg daily for HSV is to suppression.  Your blood pressure is at goal on current medication regimen, no changes warranted.   Your current weight is 218 and BMI is 42, recommend a low-fat, low carbohydrate diet divided over 5-6 small meals throughout the day.  Discussed the importance of eating breakfast consistently.  If you are not hungry, recommend replacing meal with Glucerna.  Your hemoglobin A1c is 6.4 we will continue glipizide 10 mg.  Continue to hold Lantus.   Continue physical therapy as scheduled for right-sided weakness.  Reviewed MRI of head and CT scan no acute stroke noted.

## 2017-12-03 ENCOUNTER — Other Ambulatory Visit: Payer: Self-pay | Admitting: Family Medicine

## 2017-12-03 ENCOUNTER — Telehealth: Payer: Self-pay

## 2017-12-03 DIAGNOSIS — B9689 Other specified bacterial agents as the cause of diseases classified elsewhere: Secondary | ICD-10-CM

## 2017-12-03 DIAGNOSIS — N760 Acute vaginitis: Principal | ICD-10-CM

## 2017-12-03 LAB — VAGINITIS/VAGINOSIS, DNA PROBE
Candida Species: NEGATIVE
Gardnerella vaginalis: POSITIVE — AB
Trichomonas vaginosis: NEGATIVE

## 2017-12-03 LAB — URINE CULTURE

## 2017-12-03 MED ORDER — METRONIDAZOLE 500 MG PO TABS
500.0000 mg | ORAL_TABLET | Freq: Two times a day (BID) | ORAL | 0 refills | Status: DC
Start: 2017-12-03 — End: 2018-08-03

## 2017-12-03 NOTE — Progress Notes (Signed)
Meds ordered this encounter  Medications  . metroNIDAZOLE (FLAGYL) 500 MG tablet    Sig: Take 1 tablet (500 mg total) by mouth 2 (two) times daily.    Dispense:  14 tablet    Refill:  0     Lachina Moore Hollis  MSN, FNP-C [facility] [address]  [place], [place] [address] [phone]  

## 2017-12-03 NOTE — Telephone Encounter (Signed)
-----   Message from Massie MaroonLachina M Hollis, [place] sent at 12/03/2017  5:51 AM EST ----- Regarding: lab results Please inform patient that vaginal swab yielded bacterial vaginitis.  Will start Metronidazole 500 mg BID for 7 days. Refrain from alcohol while taking metronidazole.   Thanks

## 2017-12-03 NOTE — Telephone Encounter (Signed)
Called, no answer and no voicemail picked up. Will try later. Thanks!  

## 2017-12-05 ENCOUNTER — Other Ambulatory Visit: Payer: Self-pay | Admitting: Family Medicine

## 2017-12-05 ENCOUNTER — Telehealth: Payer: Self-pay | Admitting: Family Medicine

## 2017-12-05 DIAGNOSIS — N390 Urinary tract infection, site not specified: Secondary | ICD-10-CM

## 2017-12-05 MED ORDER — SULFAMETHOXAZOLE-TRIMETHOPRIM 800-160 MG PO TABS: 1.0000 | ORAL_TABLET | Freq: Two times a day (BID) | ORAL | 0 refills | Status: DC

## 2017-12-05 NOTE — Progress Notes (Signed)
Meds ordered this encounter  Medications  . sulfamethoxazole-trimethoprim (BACTRIM DS,SEPTRA DS) 800-160 MG tablet    Sig: Take 1 tablet by mouth 2 (two) times daily.    Dispense:  20 tablet    Refill:  0    Rakesha Dalporto Moore Aaron Boeh  MSN, FNP-C [facility] [address]  [place], [place] [address] [phone]  

## 2017-12-05 NOTE — Telephone Encounter (Signed)
Shelley Mccoy, a 47 year old female presented to establish care 1 week ago. Reviewed labs, urine culture yielded E.coli. Will start Bactrim 800-160 mg every 12 hours for 10 days. Attempted to contact patient, her number was not in service.    Shelley NationsLachina Moore Etheleen Valtierra  MSN, FNP-C Patient Care [facility] [address]  [place], [place] [address] [phone]

## 2017-12-06 NOTE — Telephone Encounter (Signed)
Called, no answer and no voicemail picked up. Will try later. Thanks!  

## 2017-12-07 ENCOUNTER — Encounter: Payer: Self-pay | Admitting: Physical Therapy

## 2017-12-07 ENCOUNTER — Ambulatory Visit: Payer: Self-pay | Admitting: *Deleted

## 2017-12-07 ENCOUNTER — Other Ambulatory Visit: Payer: Self-pay

## 2017-12-07 ENCOUNTER — Ambulatory Visit: Payer: Self-pay | Attending: Neurology | Admitting: Physical Therapy

## 2017-12-07 ENCOUNTER — Encounter: Payer: Self-pay | Admitting: *Deleted

## 2017-12-07 DIAGNOSIS — R41844 Frontal lobe and executive function deficit: Secondary | ICD-10-CM | POA: Insufficient documentation

## 2017-12-07 DIAGNOSIS — R2689 Other abnormalities of gait and mobility: Secondary | ICD-10-CM | POA: Insufficient documentation

## 2017-12-07 DIAGNOSIS — R278 Other lack of coordination: Secondary | ICD-10-CM

## 2017-12-07 DIAGNOSIS — M6281 Muscle weakness (generalized): Secondary | ICD-10-CM

## 2017-12-07 DIAGNOSIS — R2681 Unsteadiness on feet: Secondary | ICD-10-CM | POA: Insufficient documentation

## 2017-12-07 NOTE — Patient Instructions (Addendum)
MOTION: Single Leg Balance: Symmetrical Shoulder Diagonal 1  Toe / Heel Raise    Gently rock back on heels and raise toes. Then rock forward on toes and raise heels. Repeat sequence __10__ times per session. Do ___5_ sessions per week.  Copyright  VHI. All rights reserved.  Heel Raises    Stand with support. Tighten pelvic floor and hold. With knees straight, raise heels off ground. Repeat _10__ times. Do _2__ times a day.  Copyright  VHI. All rights reserved.  Wall Slide    Keep head, shoulders, and back against wall, with feet out in front and slightly wider than shoulder width. Slowly lower buttocks by sliding down wall until thighs are parallel to floor. Keep back flat. Repeat _10___ times per set. Do ___1-2_ sets per session. Do _1___ sessions per day.  http://orth.exer.us/153   Copyright  VHI. All rights reserved.  Single Leg - Eyes Open    While standing on left leg, close eyes and maintain balance. Hold__5-10__ seconds. Repeat ___10_ times per session. Do _2___ sessions per day.  Copyright  VHI. All rights reserved.    Stand on right leg. Raise both arms over head, reaching up and across face, and return _5-10__ times. _2__ sets _2__ times per day.  http://ggbe.exer.us/79   Copyright  VHI. All rights reserved.  Feet Heel-Toe "Tandem" (Compliant Surface) Head Motion - Eyes Closed    Stand on compliant surface: __both feet in front of each other, alternate______ with right foot directly in front of the other. Close eyes and move head slowly, up and down. Repeat __10__ times per session. Do _2___ sessions per day.  Copyright  VHI. All rights reserved.

## 2017-12-07 NOTE — Therapy (Signed)
[facility] [address] [place], [place], [address] Phone: [phone]   Fax:  [phone]  Physical Therapy Treatment  Patient Details  Name: Shelley Mccoy MRN: [id number] Date of Birth: 23-Jan-1971 Referring Provider: Dr. Marvel Plan   Encounter Date: 12/07/2017  PT End of Session - 12/07/17 1601    Visit Number  2    Number of Visits  17    Date for PT Re-Evaluation  01/22/18    Authorization Type  [employer]-awaiting insurance verification    PT Start Time  1100    PT Stop Time  1143    PT Time Calculation (min)  43 min    Equipment Utilized During Treatment  Gait belt    Activity Tolerance  Patient tolerated treatment well    Behavior During Therapy  Flat affect       Past Medical History:  Diagnosis Date  . Anemia   . Asthma   . Diabetes mellitus without complication (HCC)   . Heart murmur   . History of uterine fibroid   . HSV-2 (herpes simplex virus 2) infection   . Hypertension   . Seasonal allergies     Past Surgical History:  Procedure Laterality Date  . CYSTECTOMY     hand and breast  . LAPAROSCOPIC ABDOMINAL EXPLORATION    . TOTAL ABDOMINAL HYSTERECTOMY      There were no vitals filed for this visit.  Subjective Assessment - 12/07/17 1105    Subjective  No reported pain, changes in medications, and no falls. Pt states, I'm not doing my ankle pumps exercise, I am doing the sit to stand and just walking everyday. I want to get better."     Patient Stated Goals  Pt's goals for therapy is to get better, back to normal.    Currently in Pain?  No/denies    Pain Score  0-No pain         [facility] Adult PT Treatment/Exercise - 12/07/17 1542      Transfers   Transfers  Sit to Stand;Stand to Sit During rest breaks between exercises during PT session x 5.    Sit to Stand  6: Modified independent (Device/Increase time);With upper extremity assist;From chair/3-in-1    Stand to Sit  6: Modified  independent (Device/Increase time);Without upper extremity assist;To chair/3-in-1      Ambulation/Gait   Ambulation/Gait  Yes    Ambulation/Gait Assistance  5: Supervision    Ambulation/Gait Assistance Details  Pt used no AD during gait around clinic today.     Ambulation Distance (Feet)  50 Feet x 3 around clinic.     Assistive device  None    Gait Pattern  Step-through pattern;Decreased step length - right;Decreased dorsiflexion - right;Trendelenburg;Lateral trunk lean to left;Poor foot clearance - right    Ambulation Surface  Level;Indoor    Ramp  5: Supervision    Ramp Details (indicate cue type and reason)  Performed ramp ascending and descending with blue mat on ramp x 3 reps each for LE strengthening and practice of walking on uneven surfaces for balance challenge.      High Level Balance   High Level Balance Activities  Backward walking;Tandem walking;Marching forwards;Marching backwards;Side stepping    High Level Balance Comments  Pt performed high level activities on a red and blue mat beside countertop for intermediate single UE support. Pt performe x 3 times each along countertop: forward walking, backward walking, forward and backward tandem walking, side stepping left and  right, marching forward and backwards. Pt required tactile cues for marches due to initial low march bothways.       Knee/Hip Exercises: Standing   Heel Raises  10 reps;2 sets;Both And Toe Raises with countertop UE support.    Wall Squat  10 reps;5 seconds;Limitations;Other (comment)    Wall Squat Limitations  Pt required verbal and demonstration cues for performance of task. During exercises pt required verbal reminded for upright posture and back flat against wall. Pt lowered to 50-60 degrees to maintain pain free in knees.     SLS  --        Balance Exercises - 12/07/17 1545      Balance Exercises: Standing   Standing Eyes Closed  Narrow base of support (BOS);Head turns;Foam/compliant surface;30 secs;2  reps;Other (comment) Standing on two pillows    Tandem Stance  Eyes closed;Eyes open;Foam/compliant surface;Intermittent upper extremity support;30 secs;2 reps;Other reps (comment) bilaterally, standing on two pillows    SLS  Solid surface;Intermittent upper extremity support;10 secs;5 reps bilaterally, with countertop touch intermittent      Balance Exercises: Standing   Tandem Stance Limitations  Pt had increased difficulty with tandem stance; began task with eyes open, progressed to EC x 2 reps of 30 sec bilaterally. Pt did get more comfortable with the tandem stance after practicing a few reps, and was able to incorporate gentle head turns up/down and left/right for 2 reps x 30 sec each. Required min assistance to supervision assistance, used gait belt for safety.     SLS Limitations  Pt able to perform task immediately with only an occasional UE touch on countertop to hold SLS. Pt able to hold SLS bilaterally between 5-10 seconds before using a stepping strategy.         PT Education - 12/07/17 1600    Education provided  Yes    Education Details  Added to current HEP program: Wall squats, SLS, tandem stance, heel raises, toe raises    Person(s) Educated  Patient    Methods  Explanation;Demonstration;Verbal cues;Handout    Comprehension  Verbalized understanding       PT Short Term Goals - 11/23/17 2008      PT SHORT TERM GOAL #1   Title  Pt will be independent with HEP for improved strength, balance, and gait.  TARGET 12/17/17    Time  4    Period  Weeks    Status  New    Target Date  12/17/17      PT SHORT TERM GOAL #2   Title  Pt will perform at least 8 of 10 reps of sit<>stand transfers without UE support for improved lower extremity strength and transfer ability.    Time  4    Period  Weeks    Status  New    Target Date  12/17/17      PT SHORT TERM GOAL #3   Title  Pt will improve DGI score to at least 15/24 for decreased fall risk.    Time  4    Period  Weeks     Status  New    Target Date  12/17/17      PT SHORT TERM GOAL #4   Title  Pt will negotiate at least 4 steps with one handrail step-through pattern, modified independently.    Time  4    Period  Weeks    Status  New    Target Date  12/17/17  PT Long Term Goals - 11/23/17 2010      PT LONG TERM GOAL #1   Title  Pt will verbalize understanding of fall prevention in home environment.  TARGET 01/21/18    Time  9    Period  Weeks    Status  New    Target Date  01/21/18      PT LONG TERM GOAL #2   Title  Pt will improve gait velocity to at least 2.62 ft/sec for improved gait efficiency and safety.    Time  9    Period  Weeks    Status  New    Target Date  01/21/18      PT LONG TERM GOAL #3   Title  Pt will improve DGI score to at least 19/24 for decreased fall risk.    Time  9    Period  Weeks    Status  New    Target Date  01/21/18      PT LONG TERM GOAL #4   Title  Pt will ambulate at least 1000 ft, indoor and outdoor surfaces, independently, for improved community gait.    Time  9    Period  Weeks    Status  New    Target Date  01/21/18         Plan - 12/07/17 1603    Clinical Impression Statement  Today's skilled PT session focused on establishing an HEP to help improve patients static balance on uneven surfaces, and bilateral LE strengthening. During today's session dynamic balance was also challenged while performing various directions of walking on an uneven surfaces. Overall, pt tolerated treatment well and verbalized understanding of each exercises added to HEP today. Pt is making progress towards meeting STG's. Pt would also benefit from continued PT to progress towards unmet PT goals.     History and Personal Factors relevant to plan of care:  PMH > 3 co-morbidities, independent PTA    Clinical Presentation  Stable    Clinical Presentation due to:  hx stroke like symptoms, ?conversion disorder; fall risk per DGI score    Clinical Decision Making  Low     Rehab Potential  Good    Clinical Impairments Affecting Rehab Potential  ?conversion disorder     PT Frequency  2x / week    PT Duration  8 weeks    PT Treatment/Interventions  ADLs/Self Care Home Management;Balance training;DME Instruction;Therapeutic exercise;Therapeutic activities;Functional mobility training;Gait training;Stair training;Neuromuscular re-education;Patient/family education    PT Next Visit Plan  Follow up on HEP. Continue to progress RLE strengthening and dynamic & static balance on uneven surfaces. Progress towards STG's and Check STG's on 3/22.     Consulted and Agree with Plan of Care  Patient       Patient will benefit from skilled therapeutic intervention in order to improve the following deficits and impairments:  Abnormal gait, Decreased balance, Decreased mobility, Decreased strength, Difficulty walking  Visit Diagnosis: Other abnormalities of gait and mobility  Unsteadiness on feet  Muscle weakness (generalized)     Problem List Patient Active Problem List   Diagnosis Date Noted  . Morbid obesity with BMI of 40.0-44.9, adult (HCC) 12/01/2017  . Type 2 diabetes mellitus without complication, without long-term current use of insulin (HCC) 12/01/2017  . Essential hypertension 12/01/2017  . Hyperlipidemia LDL goal <100 12/01/2017  . 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 [place], [place], [address] Phone: [phone]   Fax:  [phone]  Name: Shelley Mccoy MRN: [id number] Date of Birth: August 03, 1971

## 2017-12-07 NOTE — Telephone Encounter (Signed)
-----   Message from Massie MaroonLachina M Hollis, [place] sent at 12/07/2017  6:21 AM EDT ----- Regarding: lab results Reviewed urine culture results, yielded E. coli. Will start Bactrim 800-160 mg every 12 hours for 10 days. Increase water intake to 6-8 glasses, wipe from front to back, and practice good perianal hygiene.   Follow-up as scheduled.  Attempted to call patient, her phone number was temporarily disconnected. May have to send a letter.    Nolon NationsLachina Moore Hollis  MSN, FNP-C Patient Care [facility] [address]  [place], [place] [address] [phone]

## 2017-12-07 NOTE — Therapy (Signed)
[facility] [address] [place], [place], [address] Phone: [phone]   Fax:  [phone]  Occupational Therapy Treatment  Patient Details  Name: Shelley Mccoy MRN: [id number] Date of Birth: [birth year] Referring Provider: Dr. Marvel PlanJindong Xu   Encounter Date: 12/07/2017  OT End of Session - 12/07/17 1108    Visit Number  2    Number of Visits  17    Date for OT Re-Evaluation  01/22/18    Authorization Type  awaiting insurance verification ?[employer]    OT Start Time  1018    OT Stop Time  1056    OT Time Calculation (min)  38 min    Activity Tolerance  Patient tolerated treatment well    Behavior During Therapy  Flat affect       Past Medical History:  Diagnosis Date  . Anemia   . Asthma   . Diabetes mellitus without complication (HCC)   . Heart murmur   . History of uterine fibroid   . HSV-2 (herpes simplex virus 2) infection   . Hypertension   . Seasonal allergies     Past Surgical History:  Procedure Laterality Date  . CYSTECTOMY     hand and breast  . LAPAROSCOPIC ABDOMINAL EXPLORATION    . TOTAL ABDOMINAL HYSTERECTOMY      There were no vitals filed for this visit.  Subjective Assessment - 12/07/17 1021    Subjective   Pt denies pain. Pt reports that she is back to work and states "It's going ok"    Pertinent History  Stroke-like symptoms, MRI negative, probable conversion disorder 11/07/17; anemia, asthma, DM, HTN, hyperlipidemia, hx of Bells palsy per pt report (but speech changes returned to baseline quickly per pt report)      Limitations  fall risk    Patient Stated Goals  to get better    Currently in Pain?  No/denies    Pain Score  0-No pain                   OT Treatments/Exercises (OP) - 12/07/17 0001      Exercises   Exercises  Hand;Theraputty      Theraputty   Theraputty - Grip  Yellow putty for grip bilateral hands x10 reps each    Theraputty - Pinch  yellow putty for  three point and lateral pinch x 10 reps each      Fine Motor Coordination (Hand/Wrist)   Fine Motor Coordination  Flipping cards;Dealing card with thumb;Picking up coins;Stacking coins;Manipulating coins;Tossing ball;Nuts and Bolts    Flipping cards  bilateral hands    Dealing card with thumb  bilateral hands    Picking up coins  R hand picking up coins, w/o difficulty noted    Manipulating coins  In hand manipulation of coins and placing one at a time into piggy bank R w/o difficulty noted    Stacking coins  Picking up coins with right hand and stacking in piles of 5. No difficulty noted    Tossing ball  Tossing ball & catching in same hand as wel las tossing and catching between hands.    Nuts and Bolts  Nuts and bolts of various diameters, No difficulty noted. Discussed picking up beans or other small objects at home for coordination.       Coordination Activities Perform the following activities for 10 minutes 1-2 times per day with right hand(s).   Rotate ball in fingertips (clockwise and counter-clockwise).  Toss  ball between hands.  Toss ball in air and catch with the same hand.  Flip cards 1 at a time as fast as you can.  Deal cards with your thumb (Hold deck in hand and push card off top with thumb).  Shuffle cards.  Pick up coins and place in container or coin bank.  Pick up coins and stack.  Pick up coins one at a time until you get 5-10 in your hand, then move coins from palm to fingertips to stack one at a time.  Screw together nuts and bolts, then unfasten.   Grip Strengthening (Resistive Putty)    Squeeze putty using thumb and all fingers. Repeat __10__ times. Do __1__ sessions per day.   Three Jaw Chuck Pinch Strengthening (Resistive Putty)    Pull putty, using thumb, index and middle fingers. Repeat __10__ times. Do __1__ sessions per day.  Copyright  VHI. All rights reserved.  Lateral Pinch Strengthening (Resistive Putty)    Squeeze between  thumb and side of each finger in turn. Repeat __10__ times. Do _1___ sessions per day.                OT Education - 12/07/17 1103    Education provided  Yes    Education Details  Issued HEP for coordination and grip/pinch strengthening    Person(s) Educated  Patient    Methods  Explanation;Demonstration;Handout;Verbal cues    Comprehension  Verbalized understanding;Returned demonstration;Verbal cues required       OT Short Term Goals - 11/23/17 1054      OT SHORT TERM GOAL #1   Title  Pt will be independent with initial HEP.--check STGs 12/22/17    Time  4    Period  Weeks    Status  New      OT SHORT TERM GOAL #2   Title  Pt will perform BADLs using RUE as dominant UE 100% of the time.    Time  4    Period  Weeks    Status  New      OT SHORT TERM GOAL #3   Title  Pt will perform mod complex cleaning tasks for at least without rest breaks and using good safety.    Time  4    Period  Weeks    Status  New      OT SHORT TERM GOAL #4   Title  Pt will improve dominant RUE coordination for ADLs as shown by completing 9-hole peg test in 22sec or less.    Baseline  28sec    Time  4    Period  Weeks    Status  New      OT SHORT TERM GOAL #5   Title  Pt will improve R grip strength for ADLs to at least 20lbs over 3 trials.    Baseline  5, 10, 10lbs    Time  4    Period  Weeks    Status  New      OT SHORT TERM GOAL #6   Title  --    Baseline  --    Time  --    Period  --    Status  --        OT Long Term Goals - 11/23/17 1057      OT LONG TERM GOAL #1   Title  Pt will be independent with updated HEP.--check LTGs 01/22/18    Time  8    Period  Weeks  Status  New      OT LONG TERM GOAL #2   Title  Pt will perform mod complex cooking tasks with good safety.    Time  8    Period  Weeks    Status  New      OT LONG TERM GOAL #3   Title  Pt will improve R grip strength for ADLs to at least 30lbs over 3 trials.    Baseline  5, 10, 10lbs     Time  8    Period  Weeks    Status  New      OT LONG TERM GOAL #4   Title  Pt will be able to lift/move at least 40lbs with BUEs utilizing good body mechanics/safety for work tasks.    Time  8    Period  Weeks    Status  New      OT LONG TERM GOAL #5   Title  Pt will report RUE pain less than or equal to 2/10 for ADLs.    Time  8    Period  Weeks    Status  New            Plan - 12/07/17 1108    Clinical Impression Statement  Pt should benefit from HEP for R grip/pinch strengthening as well as coordination/fine motor ex's. 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 Consider general UE strengthening HEP next 1-2 visits.    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  Pt now reports difficulty and fatigue with dominant RUE functional use and performing household tasks.  Pt reports that she has driven and returned to work yesterday; however, question pt safety of doing so.      Occupational performance deficits (Please refer to evaluation for details):  ADL's;IADL's;Work;Leisure    Rehab Potential  Good    OT Frequency  2x / week    OT Duration  8 weeks +Eval    OT Treatment/Interventions  Self-care/ADL training;Therapeutic exercise;Patient/family education;Neuromuscular education;Moist Heat;Functional Development worker, community;Therapeutic activities;Balance training;Cognitive remediation/compensation;Manual Therapy;DME and/or AE instruction;Cryotherapy    Plan  Review Coordination, putty HEP & issue UE general strengthening HEP as able.    Clinical Decision Making  Limited treatment options, no task modification necessary    Recommended Other Services  current with PT; scheduled for ST eval    Consulted and Agree with Plan of Care  Patient       Patient will benefit from skilled therapeutic  intervention in order to improve the following deficits and impairments:  Decreased cognition, Decreased knowledge of use of DME, Pain, Decreased mobility, Decreased coordination, Decreased activity tolerance, Decreased endurance, Decreased strength, Impaired tone, Impaired UE functional use, Impaired perceived functional ability, Decreased safety awareness, Decreased balance, Decreased knowledge of precautions  Visit Diagnosis: Frontal lobe and executive function deficit  Muscle weakness (generalized)  Other lack of coordination    Problem List Patient Active Problem List   Diagnosis Date Noted  . Morbid obesity with BMI of 40.0-44.9, adult (HCC) 12/01/2017  . Type 2 diabetes mellitus without complication, without long-term current use of insulin (HCC) 12/01/2017  . Essential hypertension 12/01/2017  . Hyperlipidemia LDL goal <100 12/01/2017  . 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

## 2017-12-07 NOTE — Patient Instructions (Signed)
  Coordination Activities Perform the following activities for 10 minutes 1-2 times per day with right hand(s).   Rotate ball in fingertips (clockwise and counter-clockwise).  Toss ball between hands.  Toss ball in air and catch with the same hand.  Flip cards 1 at a time as fast as you can.  Deal cards with your thumb (Hold deck in hand and push card off top with thumb).  Shuffle cards.  Pick up coins and place in container or coin bank.  Pick up coins and stack.  Pick up coins one at a time until you get 5-10 in your hand, then move coins from palm to fingertips to stack one at a time.  Screw together nuts and bolts, then unfasten.   Grip Strengthening (Resistive Putty)    Squeeze putty using thumb and all fingers. Repeat __10__ times. Do __1__ sessions per day.   Three Jaw Chuck Pinch Strengthening (Resistive Putty)    Pull putty, using thumb, index and middle fingers. Repeat __10__ times. Do __1__ sessions per day.  Copyright  VHI. All rights reserved.  Lateral Pinch Strengthening (Resistive Putty)    Squeeze between thumb and side of each finger in turn. Repeat __10__ times. Do _1___ sessions per day.

## 2017-12-07 NOTE — Telephone Encounter (Signed)
I have not been able to contact patient by phone. I will mail a letter to advise of UTI and BV. Thanks!

## 2017-12-09 ENCOUNTER — Encounter: Payer: 59 | Admitting: Speech Pathology

## 2017-12-16 ENCOUNTER — Ambulatory Visit: Payer: Self-pay | Admitting: Occupational Therapy

## 2017-12-16 ENCOUNTER — Encounter: Payer: Managed Care, Other (non HMO) | Admitting: Speech Pathology

## 2017-12-16 ENCOUNTER — Encounter: Payer: Self-pay | Admitting: Occupational Therapy

## 2017-12-16 ENCOUNTER — Ambulatory Visit: Payer: Self-pay | Admitting: Physical Therapy

## 2017-12-16 ENCOUNTER — Encounter: Payer: Self-pay | Admitting: Physical Therapy

## 2017-12-20 ENCOUNTER — Ambulatory Visit: Payer: Self-pay | Admitting: Physical Therapy

## 2017-12-20 DIAGNOSIS — M6281 Muscle weakness (generalized): Secondary | ICD-10-CM

## 2017-12-20 DIAGNOSIS — R2689 Other abnormalities of gait and mobility: Secondary | ICD-10-CM

## 2017-12-20 NOTE — Patient Instructions (Signed)
Heel Raise: Unilateral (Standing)    Balance on left foot, then rise on ball of foot. Repeat __10__ times per set. Do __1-2__ sets per session. Do __1__ sessions per day.  http://orth.exer.us/41   Copyright  VHI. All rights reserved.  KNEE: Flexion - Prone    Bend knee. Raise heel toward buttocks. Do not raise hips. 10_ reps per set, _2_ sets per day, 5___ days per week   Copyright  VHI. All rights reserved.  Hip Extension: 2-4 Inches    Tighten gluteal muscle. Lift one leg _10__ times. Restabilize pelvis. Repeat with other leg. Keep pelvis still. Be sure pelvis does not rotate and back does not arch. Do __1_ sets, 1__ times per day. Can do with knee flexed  http://ss.exer.us/63   Copyright  VHI. All rights reserved.  HIP: Abduction - Side-Lying    Lie on side, legs straight and in line with trunk. Squeeze glutes. Raise top leg up and slightly back. Point toes forward. _10__ reps per set, _2_ sets per day, _5__ days per week Bend bottom leg to stabilize pelvis.  Copyright  VHI. All rights reserved.

## 2017-12-21 ENCOUNTER — Encounter: Payer: Self-pay | Admitting: Physical Therapy

## 2017-12-21 NOTE — Therapy (Signed)
[facility] [address] [place], [place], [address] Phone: [phone]   Fax:  [phone]  Physical Therapy Treatment  Patient Details  Name: Shelley Mccoy MRN: [id number] Date of Birth: Feb 07, 1971 Referring Provider: Dr. Rosalin Hawking   Encounter Date: 12/20/2017  PT End of Session - 12/21/17 1036    Visit Number  3    Number of Visits  17    Date for PT Re-Evaluation  01/22/18    Authorization Type  [employer]-awaiting insurance verification    PT Start Time  1106    PT Stop Time  1150    PT Time Calculation (min)  44 min       Past Medical History:  Diagnosis Date  . Anemia   . Asthma   . Diabetes mellitus without complication ([place])   . Heart murmur   . History of uterine fibroid   . HSV-2 (herpes simplex virus 2) infection   . Hypertension   . Seasonal allergies     Past Surgical History:  Procedure Laterality Date  . CYSTECTOMY     hand and breast  . LAPAROSCOPIC ABDOMINAL EXPLORATION    . TOTAL ABDOMINAL HYSTERECTOMY      There were no vitals filed for this visit.  Subjective Assessment - 12/21/17 1027    Subjective  Pt states she is not really having any trouble with anything - unable to walk outside alot due to allergies    Patient Stated Goals  Pt's goals for therapy is to get better, back to normal.                       [facility] Adult PT Treatment/Exercise - 12/21/17 0001      Transfers   Transfers  Sit to Stand    Sit to Stand  6: Modified independent (Device/Increase time)    Stand to Sit  6: Modified independent (Device/Increase time)      Dynamic Gait Index   Level Surface  Mild Impairment    Change in Gait Speed  Mild Impairment    Gait with Horizontal Head Turns  Mild Impairment c/o mild dizziness    Gait with Vertical Head Turns  Normal    Gait and Pivot Turn  Normal    Step Over Obstacle  Mild Impairment    Step Around Obstacles  Normal    Steps  Normal    Total  Score  20      Exercises   Exercises  Lumbar;Knee/Hip;Ankle      Knee/Hip Exercises: Standing   Heel Raises  10 reps;2 sets;Both And Toe Raises with countertop UE support.      Knee/Hip Exercises: Supine   Bridges  Both;1 set;5 reps    Straight Leg Raises  Right;1 set;10 reps 3# weight    Other Supine Knee/Hip Exercises  Rt 1/2 bridge x 10 reps:  bridging with LLE extension x 10 reps       Knee/Hip Exercises: Sidelying   Hip ABduction  AROM;Strengthening;Right;1 set;10 reps    Clams  RLE 10 reps with 2 sec hold      Knee/Hip Exercises: Prone   Hamstring Curl  1 set;10 reps;2 seconds RLE with 3# weight    Other Prone Exercises  Rt hip extension with knee flexed 10 reps:             PT Education - 12/21/17 1036    Education provided  Yes    Education Details  added strengthening exercises to HEP - see pt instructions    Person(s) Educated  Patient    Methods  Explanation;Demonstration;Handout    Comprehension  Verbalized understanding;Returned demonstration       PT Short Term Goals - 12/21/17 1028      PT SHORT TERM GOAL #1   Title  Pt will be independent with HEP for improved strength, balance, and gait.  TARGET 12/17/17    Status  Achieved      PT SHORT TERM GOAL #2   Title  Pt will perform at least 8 of 10 reps of sit<>stand transfers without UE support for improved lower extremity strength and transfer ability.    Baseline  met 12-20-16    Status  Achieved      PT SHORT TERM GOAL #3   Title  Pt will improve DGI score to at least 15/24 for decreased fall risk.    Status  Achieved      PT SHORT TERM GOAL #4   Title  Pt will negotiate at least 4 steps with one handrail step-through pattern, modified independently.    Baseline  met 12-20-17    Status  Achieved        PT Long Term Goals - 11/23/17 2010      PT LONG TERM GOAL #1   Title  Pt will verbalize understanding of fall prevention in home environment.  TARGET 01/21/18    Time  9    Period  Weeks     Status  New    Target Date  01/21/18      PT LONG TERM GOAL #2   Title  Pt will improve gait velocity to at least 2.62 ft/sec for improved gait efficiency and safety.    Time  9    Period  Weeks    Status  New    Target Date  01/21/18      PT LONG TERM GOAL #3   Title  Pt will improve DGI score to at least 19/24 for decreased fall risk.    Time  9    Period  Weeks    Status  New    Target Date  01/21/18      PT LONG TERM GOAL #4   Title  Pt will ambulate at least 1000 ft, indoor and outdoor surfaces, independently, for improved community gait.    Time  9    Period  Weeks    Status  New    Target Date  01/21/18            Plan - 12/21/17 1037    Clinical Impression Statement  Pt's DGI score has improved from 11/24 to 20/24; pt has met 4/4 STG's; pt continues to have decreased strength in Rt hip musculature, especially hip extensors and hip abductors. Pt is progressing well, even thought attendance has been limited.    Rehab Potential  Good    Clinical Impairments Affecting Rehab Potential  ?conversion disorder     PT Frequency  2x / week    PT Duration  8 weeks    PT Treatment/Interventions  ADLs/Self Care Home Management;Balance training;DME Instruction;Therapeutic exercise;Therapeutic activities;Functional mobility training;Gait training;Stair training;Neuromuscular re-education;Patient/family education    PT Next Visit Plan  check HEP as updated on 12-20-17: continue with RLE strengthening    Consulted and Agree with Plan of Care  Patient       Patient will benefit from skilled therapeutic intervention in order to improve the following deficits and  impairments:  Abnormal gait, Decreased balance, Decreased mobility, Decreased strength, Difficulty walking  Visit Diagnosis: Muscle weakness (generalized)  Other abnormalities of gait and mobility     Problem List Patient Active Problem List   Diagnosis Date Noted  . Morbid obesity with BMI of 40.0-44.9, adult ([place])  12/01/2017  . Type 2 diabetes mellitus without complication, without long-term current use of insulin ([place]) 12/01/2017  . Essential hypertension 12/01/2017  . Hyperlipidemia LDL goal <100 12/01/2017  . Stroke (cerebrum) ([place]) 11/07/2017    Amandy Chubbuck, [place], PT 12/21/2017, 10:43 AM  [facility] [address] [place], [place], [address] Phone: [phone]   Fax:  [phone]  Name: Shelley Mccoy MRN: [id number] Date of Birth: 08/13/1971

## 2017-12-29 ENCOUNTER — Ambulatory Visit: Payer: Managed Care, Other (non HMO) | Admitting: Physical Therapy

## 2017-12-29 ENCOUNTER — Encounter: Payer: Managed Care, Other (non HMO) | Admitting: *Deleted

## 2018-01-07 ENCOUNTER — Encounter: Payer: Managed Care, Other (non HMO) | Admitting: Occupational Therapy

## 2018-01-07 ENCOUNTER — Ambulatory Visit: Payer: Managed Care, Other (non HMO) | Admitting: Physical Therapy

## 2018-01-13 ENCOUNTER — Ambulatory Visit: Payer: PRIVATE HEALTH INSURANCE | Attending: Neurology | Admitting: Physical Therapy

## 2018-01-18 ENCOUNTER — Ambulatory Visit: Payer: Self-pay | Admitting: Physical Therapy

## 2018-01-18 ENCOUNTER — Encounter: Payer: Self-pay | Admitting: *Deleted

## 2018-01-20 ENCOUNTER — Encounter: Payer: Managed Care, Other (non HMO) | Admitting: *Deleted

## 2018-01-21 ENCOUNTER — Emergency Department (HOSPITAL_COMMUNITY): Payer: Self-pay

## 2018-01-21 ENCOUNTER — Emergency Department (HOSPITAL_COMMUNITY)
Admission: EM | Admit: 2018-01-21 | Discharge: 2018-01-21 | Disposition: A | Discharge status: Home / Self Care | Payer: Self-pay | Attending: Emergency Medicine | Admitting: Emergency Medicine

## 2018-01-21 ENCOUNTER — Encounter (HOSPITAL_COMMUNITY): Payer: Self-pay | Admitting: *Deleted

## 2018-01-21 ENCOUNTER — Other Ambulatory Visit: Payer: Self-pay

## 2018-01-21 DIAGNOSIS — Z7984 Long term (current) use of oral hypoglycemic drugs: Secondary | ICD-10-CM | POA: Insufficient documentation

## 2018-01-21 DIAGNOSIS — R531 Weakness: Secondary | ICD-10-CM

## 2018-01-21 DIAGNOSIS — Z79899 Other long term (current) drug therapy: Secondary | ICD-10-CM | POA: Insufficient documentation

## 2018-01-21 DIAGNOSIS — I10 Essential (primary) hypertension: Secondary | ICD-10-CM | POA: Insufficient documentation

## 2018-01-21 DIAGNOSIS — R200 Anesthesia of skin: Secondary | ICD-10-CM | POA: Insufficient documentation

## 2018-01-21 DIAGNOSIS — Z8673 Personal history of transient ischemic attack (TIA), and cerebral infarction without residual deficits: Secondary | ICD-10-CM | POA: Insufficient documentation

## 2018-01-21 DIAGNOSIS — J45909 Unspecified asthma, uncomplicated: Secondary | ICD-10-CM | POA: Insufficient documentation

## 2018-01-21 DIAGNOSIS — R4781 Slurred speech: Secondary | ICD-10-CM | POA: Insufficient documentation

## 2018-01-21 DIAGNOSIS — E119 Type 2 diabetes mellitus without complications: Secondary | ICD-10-CM | POA: Insufficient documentation

## 2018-01-21 LAB — I-STAT CHEM 8, ED
BUN: 12 mg/dL (ref 6–20)
Calcium, Ion: 1.14 mmol/L — ABNORMAL LOW (ref 1.15–1.40)
Chloride: 104 mmol/L (ref 101–111)
Creatinine, Ser: 0.7 mg/dL (ref 0.44–1.00)
Glucose, Bld: 112 mg/dL — ABNORMAL HIGH (ref 65–99)
HCT: 36 % (ref 36.0–46.0)
Hemoglobin: 12.2 g/dL (ref 12.0–15.0)
Potassium: 3.9 mmol/L (ref 3.5–5.1)
Sodium: 142 mmol/L (ref 135–145)
TCO2: 26 mmol/L (ref 22–32)

## 2018-01-21 LAB — COMPREHENSIVE METABOLIC PANEL
ALT: 19 U/L (ref 14–54)
AST: 14 U/L — ABNORMAL LOW (ref 15–41)
Albumin: 3.8 g/dL (ref 3.5–5.0)
Alkaline Phosphatase: 101 U/L (ref 38–126)
Anion gap: 9 (ref 5–15)
BUN: 11 mg/dL (ref 6–20)
CO2: 26 mmol/L (ref 22–32)
Calcium: 9.1 mg/dL (ref 8.9–10.3)
Chloride: 107 mmol/L (ref 101–111)
Creatinine, Ser: 0.7 mg/dL (ref 0.44–1.00)
GFR calc Af Amer: >60 mL/min (ref >60)
GFR calc non Af Amer: >60 mL/min (ref >60)
Glucose, Bld: 118 mg/dL — ABNORMAL HIGH (ref 65–99)
Potassium: 3.9 mmol/L (ref 3.5–5.1)
Sodium: 142 mmol/L (ref 135–145)
Total Bilirubin: 1 mg/dL (ref 0.3–1.2)
Total Protein: 7 g/dL (ref 6.5–8.1)

## 2018-01-21 LAB — PROTIME-INR
INR: 0.99
Prothrombin Time: 13 seconds (ref 11.4–15.2)

## 2018-01-21 LAB — DIFFERENTIAL
Basophils Absolute: 0 10*3/uL (ref 0.0–0.1)
Basophils Relative: 0 %
Eosinophils Absolute: 0.1 10*3/uL (ref 0.0–0.7)
Eosinophils Relative: 2 %
Lymphocytes Relative: 50 %
Lymphs Abs: 2.9 10*3/uL (ref 0.7–4.0)
Monocytes Absolute: 0.2 10*3/uL (ref 0.1–1.0)
Monocytes Relative: 4 %
Neutro Abs: 2.5 10*3/uL (ref 1.7–7.7)
Neutrophils Relative %: 44 %

## 2018-01-21 LAB — CBC
HCT: 34.9 % — ABNORMAL LOW (ref 36.0–46.0)
Hemoglobin: 11.4 g/dL — ABNORMAL LOW (ref 12.0–15.0)
MCH: 25.6 pg — ABNORMAL LOW (ref 26.0–34.0)
MCHC: 32.7 g/dL (ref 30.0–36.0)
MCV: 78.3 fL (ref 78.0–100.0)
Platelets: 270 10*3/uL (ref 150–400)
RBC: 4.46 MIL/uL (ref 3.87–5.11)
RDW: 15.7 % — ABNORMAL HIGH (ref 11.5–15.5)
WBC: 5.7 10*3/uL (ref 4.0–10.5)

## 2018-01-21 LAB — I-STAT BETA HCG BLOOD, ED (MC, WL, AP ONLY): I-stat hCG, quantitative: <5 m[IU]/mL (ref <5)

## 2018-01-21 LAB — URINALYSIS, ROUTINE W REFLEX MICROSCOPIC
Bilirubin Urine: NEGATIVE
Glucose, UA: NEGATIVE mg/dL
Hgb urine dipstick: NEGATIVE
Ketones, ur: NEGATIVE mg/dL
Leukocytes, UA: NEGATIVE
Nitrite: NEGATIVE
Protein, ur: NEGATIVE mg/dL
Specific Gravity, Urine: 1.012 (ref 1.005–1.030)
pH: 8 (ref 5.0–8.0)

## 2018-01-21 LAB — I-STAT TROPONIN, ED: Troponin i, poc: 0 ng/mL (ref 0.00–0.08)

## 2018-01-21 LAB — APTT: aPTT: 27 seconds (ref 24–36)

## 2018-01-21 LAB — RAPID URINE DRUG SCREEN, HOSP PERFORMED
Amphetamines: NOT DETECTED
Barbiturates: NOT DETECTED
Benzodiazepines: NOT DETECTED
Cocaine: NOT DETECTED
Opiates: NOT DETECTED
Tetrahydrocannabinol: NOT DETECTED

## 2018-01-21 LAB — CBG MONITORING, ED: Glucose-Capillary: 86 mg/dL (ref 65–99)

## 2018-01-21 LAB — ETHANOL: Alcohol, Ethyl (B): <10 mg/dL (ref <10)

## 2018-01-21 NOTE — Discharge Instructions (Signed)
Your work-up today did not show evidence of acute stroke.  The neurology team felt you are safe for discharge home if the MRI was negative.  It was.  Please follow-up with outpatient neurology and follow-up with your primary care physician.  If any symptoms change or worsen, please return to the nearest emergency department.

## 2018-01-21 NOTE — Consult Note (Addendum)
Requesting Physician: Dr. Julieanne Manson    Chief Complaint: Code stroke  History obtained from:  Patient     HPI:                                                                                                                                         Shelley Mccoy is an 47 y.o. female with history of hypertension, heart murmur, diabetes who presents to the hospital today approximately 430 complaining of left sided weakness and stuttering speech.  During triage code stroke was called.  Patient was admittedly brought to the CT, in which no mass lesion, hemorrhage, stroke was noted.  There was no MCA sign and no large vessel occlusion noted.  During exam while in the CT room patient was noted to have a stuttering speech, would not open mouth for me, was holding her mouth to the right but was able to move her left mouth right to left.  Patient was brought back to trauma to be where she continued to have intermittent start her speech but when distracted stuttering speech stopped.  She also had a right arm tremor which was distractible.  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  She did notice the signs at 10 AM but wanted to keep working.  Her coworkers around 3:00 said that she needed to go to the ED which is why she showed up late.  Patient was here back in February at that time with similar symptoms and given TPA.  At that time LDL was 69, A1c was 6.4, 2D echo showed an ejection fraction of 6065% with no cardiac source, CTA of head and neck were unremarkable.   Date last known well: Date: 01/21/2018 Time last known well: Time: 10:00 tPA Given: No: Low NIH scale and low suspicion for stroke NIH stroke scale 1 for dysarthria, 1 for right leg drift, 1 for right-sided decreased sensation.  With for a total of 3 Modified Rankin: Rankin Score=0    Past Medical History:  Diagnosis Date  . Anemia   . Asthma   . Diabetes mellitus without complication (HCC)   . Heart murmur   . History  of uterine fibroid   . HSV-2 (herpes simplex virus 2) infection   . Hypertension   . Seasonal allergies     Past Surgical History:  Procedure Laterality Date  . CYSTECTOMY     hand and breast  . LAPAROSCOPIC ABDOMINAL EXPLORATION    . TOTAL ABDOMINAL HYSTERECTOMY      Family History  Problem Relation Age of Onset  . Diabetes Mother   . Congestive Heart Failure Mother   . Kidney failure Mother   . Hypertension Mother   . Prostate cancer Father   . Hypertension Sister   . Diabetes Maternal Grandmother   . Congestive Heart Failure Maternal Grandmother   . Kidney failure Maternal  Grandmother   . Alzheimer's disease Paternal Grandmother          Social History:  reports that she has never smoked. She has never used smokeless tobacco. She reports that she does not drink alcohol or use drugs.  Allergies:  Allergies  Allergen Reactions  . Percocet [Oxycodone-Acetaminophen] Hives    Medications:                                                                                                                           No current facility-administered medications for this encounter.    Current Outpatient Medications  Medication Sig Dispense Refill  . albuterol (VENTOLIN HFA) 108 (90 Base) MCG/ACT inhaler Inhale into the lungs every 6 (six) hours as needed for wheezing or shortness of breath.    . estradiol (ESTRACE) 0.1 MG/GM vaginal cream Apply 1 gram per vagina every night for 2 weeks, then apply three times a week 30 g 12  . fexofenadine (ALLER-EASE) 180 MG tablet Take 180 mg by mouth daily.    . fluticasone-salmeterol (ADVAIR HFA) 115-21 MCG/ACT inhaler Inhale 2 puffs into the lungs 2 (two) times daily.    Marland Kitchen. gabapentin (NEURONTIN) 300 MG capsule Take 300 mg by mouth 3 (three) times daily.    Marland Kitchen. glipiZIDE (GLUCOTROL XL) 10 MG 24 hr tablet Take 10 mg by mouth daily with breakfast.    . hydrochlorothiazide (HYDRODIURIL) 12.5 MG tablet Take 12.5 mg by mouth daily.    Marland Kitchen. losartan  (COZAAR) 50 MG tablet Take 50 mg by mouth daily.    . metroNIDAZOLE (FLAGYL) 500 MG tablet Take 1 tablet (500 mg total) by mouth 2 (two) times daily. 14 tablet 0  . pravastatin (PRAVACHOL) 20 MG tablet Take 20 mg by mouth daily.    . sertraline (ZOLOFT) 50 MG tablet Take 50 mg by mouth daily.    Marland Kitchen. sulfamethoxazole-trimethoprim (BACTRIM DS,SEPTRA DS) 800-160 MG tablet Take 1 tablet by mouth 2 (two) times daily. 20 tablet 0  . valACYclovir (VALTREX) 500 MG tablet Take 1 tablet (500 mg total) by mouth daily. 30 tablet 5  . verapamil (CALAN) 40 MG tablet Take 40 mg by mouth daily.       ROS:  History obtained from the patient  General ROS: negative for - chills, fatigue, fever, night sweats, weight gain or weight loss Psychological ROS: negative for - , hallucinations, memory difficulties, mood swings or  Ophthalmic ROS: negative for - blurry vision, double vision, eye pain or loss of vision ENT ROS: negative for - epistaxis, nasal discharge, oral lesions, sore throat, tinnitus or vertigo Respiratory ROS: negative for - cough,  shortness of breath or wheezing Cardiovascular ROS: negative for - chest pain, dyspnea on exertion,  Gastrointestinal ROS: negative for - abdominal pain, diarrhea,  nausea/vomiting or stool incontinence Genito-Urinary ROS: negative for - dysuria, hematuria, incontinence or urinary frequency/urgency Musculoskeletal ROS: negative for - joint swelling or muscular weakness Neurological ROS: as noted in HPI   General Examination:                                                                                                      There were no vitals taken for this visit.  HEENT-  Normocephalic, no lesions, without obvious abnormality.  Normal external eye and conjunctiva.   Cardiovascular- S1-S2 audible, pulses palpable throughout   Lungs-no  rhonchi or wheezing noted, no excessive working breathing.  Saturations within normal limits Abdomen- All 4 quadrants palpated and nontender Extremities- Warm, dry and intact Musculoskeletal-no joint tenderness, deformity or swelling Skin-warm and dry, no hyperpigmentation, vitiligo, or suspicious lesions  Neurological Examination Mental Status: Alert, oriented, thought content appropriate.  Speech dysarthric and stuttering without evidence of aphasia.  Able to follow 3 step commands without difficulty. Cranial Nerves: II:  Visual fields grossly normal,  III,IV, VI: ptosis not present, extra-ocular motions intact bilaterally, pupils equal, round, reactive to light and accommodation V,VII: smile symmetric distracted however would not discharge the patient holds her jaw to the right and clenches her teeth tightly shut, facial light touch sensation decreased on the right splitting midline and face and chest. VIII: hearing normal bilaterally IX,X: uvula rises symmetrically XI: bilateral shoulder shrug XII: midline tongue extension Motor: Right : Upper extremity   4+/5    Left:     Upper extremity   5/5  Lower extremity   4+/5     Lower extremity   5/5 Drift with right lower leg however it is more of a bobbing.  Of note patient has give way strength throughout bilateral legs Tone and bulk:normal tone throughout; no atrophy noted Sensory: Pinprick and light touch intact throughout, bilaterally Deep Tendon Reflexes: 2+ and symmetric throughout Plantars: Right: downgoing   Left: downgoing Cerebellar: normal finger-to-nose with tremor that is distractible Gait: Not tested   Lab Results: Basic Metabolic Panel: Recent Labs  Lab 01/21/18 1628  NA 142  K 3.9  CL 104  GLUCOSE 112*  BUN 12  CREATININE 0.70    CBC: Recent Labs  Lab 01/21/18 1628  HGB 12.2  HCT 36.0    Lipid Panel: No results for input(s): CHOL, TRIG, HDL, CHOLHDL, VLDL, LDLCALC in the last 168 hours.  CBG: No  results for input(s): GLUCAP in the last 168 hours.  Imaging: Ct Head Code Stroke Wo  Contrast  Result Date: 01/21/2018 CLINICAL DATA:  Code stroke.  Aphasia and right-sided droop EXAM: CT HEAD WITHOUT CONTRAST TECHNIQUE: Contiguous axial images were obtained from the base of the skull through the vertex without intravenous contrast. COMPARISON:  11/07/2017 FINDINGS: Brain: No evidence of acute infarction, hemorrhage, hydrocephalus, extra-axial collection or mass lesion/mass effect. Vascular: No hyperdense vessel or unexpected calcification. Skull: Normal. Negative for fracture or focal lesion. Sinuses/Orbits: Partial opacification of the left frontal sinus. Other: These results were communicated to Dr. Laurence Slate at 4:36 pmon 4/26/2019by text page via the [facility] messaging system. ASPECTS [facility] Stroke Program Early CT Score) - Ganglionic level infarction (caudate, lentiform nuclei, internal capsule, insula, M1-M3 cortex): 7 - Supraganglionic infarction (M4-M6 cortex): 3 Total score (0-10 with 10 being normal): 10 IMPRESSION: Negative head CT.  ASPECTS is 10. Electronically Signed   By: Marnee Spring M.D.   On: 01/21/2018 16:36    Assessment and plan discussed with with attending physician and they are in agreement.    Felicie Morn PA-C [facility] [phone]  01/21/2018, 4:40 PM   Assessment: 47 y.o. female with risk factors of hypertension diabetes.  On evaluation today patient had inconsistencies on exam which were highly distractible.  She was not a TPA candidate.  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  At this time would obtain MRI.  If negative saying that she has had a stroke work-up in the last 2 months no further work-up necessary.  Stroke Risk Factors - diabetes mellitus and hypertension  Recommend: --MRI brain    NEUROHOSPITALIST ADDENDUM Seen and examined the patient today. Patient recently admitted for same symptoms, however exam appears to be functional.  Facial droop resolves when  asked to smile, weakness mild and appears due to poor effort. NIHSS too low to give TPA, however suspect conversion disorder.  Request MRI brain to r/o stroke. Patient can be discharged if negative. Consider outpatient psychology consult.   I have reviewed the contents of history and physical exam as documented by PA/ARNP/Resident and agree with above documentation.  I have discussed and formulated the above plan as documented. Edits to the note have been made as needed.    Georgiana Spinner Chantee Cerino MD [facility] [phone]   If 7pm to 7am, please call on call as listed on AMION.

## 2018-01-21 NOTE — ED Provider Notes (Signed)
[facility] EMERGENCY DEPARTMENT Provider Note   CSN: [id number] Arrival date & time: 01/21/18  1438   An emergency department physician performed an initial assessment on this suspected stroke patient at 1421.  History   Chief Complaint Chief Complaint  Patient presents with  . Aphasia    HPI Shelley Mccoy is a 47 y.o. female.  The history is provided by the patient and medical records. No language interpreter was used.  Neurologic Problem  This is a recurrent problem. The current episode started 6 to 12 hours ago. The problem occurs constantly. The problem has not changed since onset.Associated symptoms include headaches. Pertinent negatives include no chest pain, no abdominal pain and no shortness of breath. Nothing aggravates the symptoms. Nothing relieves the symptoms. She has tried nothing for the symptoms. The treatment provided no relief.    Past Medical History:  Diagnosis Date  . Anemia   . Asthma   . Diabetes mellitus without complication (HCC)   . Heart murmur   . History of uterine fibroid   . HSV-2 (herpes simplex virus 2) infection   . Hypertension   . Seasonal allergies     Patient Active Problem List   Diagnosis Date Noted  . Morbid obesity with BMI of 40.0-44.9, adult (HCC) 12/01/2017  . Type 2 diabetes mellitus without complication, without long-term current use of insulin (HCC) 12/01/2017  . Essential hypertension 12/01/2017  . Hyperlipidemia LDL goal <100 12/01/2017  . 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 CYSTECTOMY     hand and breast  . LAPAROSCOPIC ABDOMINAL EXPLORATION    . TOTAL ABDOMINAL HYSTERECTOMY       OB History    Gravida  4   Para  3   Term  3   Preterm      AB  1   Living  3     SAB  1   TAB      Ectopic      Multiple      Live Births  3            Home Medications    Prior to Admission medications   Medication Sig Start Date  End Date Taking? Authorizing Provider  albuterol (VENTOLIN HFA) 108 (90 Base) MCG/ACT inhaler Inhale into the lungs every 6 (six) hours as needed for wheezing or shortness of breath.    [provider]  estradiol (ESTRACE) 0.1 MG/GM vaginal cream Apply 1 gram per vagina every night for 2 weeks, then apply three times a week 07/05/17   Constant, Peggy, MD  fexofenadine (ALLER-EASE) 180 MG tablet Take 180 mg by mouth daily.    [provider]  fluticasone-salmeterol (ADVAIR HFA) 115-21 MCG/ACT inhaler Inhale 2 puffs into the lungs 2 (two) times daily.    [provider]  gabapentin (NEURONTIN) 300 MG capsule Take 300 mg by mouth 3 (three) times daily.    [provider]  glipiZIDE (GLUCOTROL XL) 10 MG 24 hr tablet Take 10 mg by mouth daily with breakfast.    [provider]  hydrochlorothiazide (HYDRODIURIL) 12.5 MG tablet Take 12.5 mg by mouth daily.    [provider]  losartan (COZAAR) 50 MG tablet Take 50 mg by mouth daily.    [provider]  metroNIDAZOLE (FLAGYL) 500 MG tablet Take 1 tablet (500 mg total) by mouth 2 (two) times daily. 12/03/17   Massie Maroon, FNP  pravastatin (PRAVACHOL)  20 MG tablet Take 20 mg by mouth daily.    [provider]  sertraline (ZOLOFT) 50 MG tablet Take 50 mg by mouth daily.    [provider]  sulfamethoxazole-trimethoprim (BACTRIM DS,SEPTRA DS) 800-160 MG tablet Take 1 tablet by mouth 2 (two) times daily. 12/05/17   Massie MaroonHollis, Lachina M, FNP  valACYclovir (VALTREX) 500 MG tablet Take 1 tablet (500 mg total) by mouth daily. 12/01/17   Massie MaroonHollis, Lachina M, FNP  verapamil (CALAN) 40 MG tablet Take 40 mg by mouth daily.    [provider]    Family History Family History  Problem Relation Age of Onset  . Diabetes Mother   . Congestive Heart Failure Mother   . Kidney failure Mother   . Hypertension Mother   . Prostate cancer Father   . Hypertension Sister   . Diabetes  Maternal Grandmother   . Congestive Heart Failure Maternal Grandmother   . Kidney failure Maternal Grandmother   . Alzheimer's disease Paternal Grandmother     Social History Social History   Tobacco Use  . Smoking status: Never Smoker  . Smokeless tobacco: Never Used  Substance Use Topics  . Alcohol use: No  . Drug use: No     Allergies   Percocet [oxycodone-acetaminophen]   Review of Systems Review of Systems  Constitutional: Negative for chills, diaphoresis, fatigue and fever.  HENT: Negative for congestion.   Eyes: Negative for visual disturbance.  Respiratory: Negative for chest tightness, shortness of breath and wheezing.   Cardiovascular: Negative for chest pain.  Gastrointestinal: Negative for abdominal pain, diarrhea, nausea and vomiting.  Genitourinary: Negative for dysuria, flank pain and frequency.  Musculoskeletal: Negative for back pain, neck pain and neck stiffness.  Skin: Negative for rash and wound.  Neurological: Positive for speech difficulty, weakness, numbness and headaches. Negative for light-headedness.  Hematological: Negative for adenopathy.  Psychiatric/Behavioral: Negative for agitation and confusion.  All other systems reviewed and are negative.    Physical Exam Updated Vital Signs BP (!) 147/78 (BP Location: Right Arm)   Pulse 63   Resp 18   SpO2 99%   Physical Exam  Constitutional: She is oriented to person, place, and time. She appears well-developed and well-nourished. No distress.  HENT:  Head: Normocephalic and atraumatic.  Mouth/Throat: Oropharynx is clear and moist. No oropharyngeal exudate.  Eyes: Pupils are equal, round, and reactive to light. Conjunctivae and EOM are normal.  Neck: Normal range of motion. Neck supple.  Cardiovascular: Normal rate and regular rhythm.  No murmur heard. Pulmonary/Chest: Effort normal and breath sounds normal. No respiratory distress. She has no wheezes. She has no rales. She exhibits no  tenderness.  Abdominal: Soft. There is no tenderness.  Musculoskeletal: She exhibits no edema or tenderness.  Lymphadenopathy:    She has no cervical adenopathy.  Neurological: She is alert and oriented to person, place, and time. A sensory deficit is present. No cranial nerve deficit. She exhibits normal muscle tone. Coordination normal.  Skin: Skin is warm and dry. Capillary refill takes less than 2 seconds. No rash noted. She is not diaphoretic. No erythema.  Psychiatric: She has a normal mood and affect.  Nursing note and vitals reviewed.    ED Treatments / Results  Labs (all labs ordered are listed, but only abnormal results are displayed) Labs Reviewed  CBC - Abnormal; Notable for the following components:      Result Value   Hemoglobin 11.4 (*)    HCT 34.9 (*)  MCH 25.6 (*)    RDW 15.7 (*)    All other components within normal limits  COMPREHENSIVE METABOLIC PANEL - Abnormal; Notable for the following components:   Glucose, Bld 118 (*)    AST 14 (*)    All other components within normal limits  I-STAT CHEM 8, ED - Abnormal; Notable for the following components:   Glucose, Bld 112 (*)    Calcium, Ion 1.14 (*)    All other components within normal limits  ETHANOL  PROTIME-INR  APTT  DIFFERENTIAL  RAPID URINE DRUG SCREEN, HOSP PERFORMED  URINALYSIS, ROUTINE W REFLEX MICROSCOPIC  I-STAT TROPONIN, ED  I-STAT BETA HCG BLOOD, ED ([facility], [facility], [facility] ONLY)  CBG MONITORING, ED    EKG EKG Interpretation  Date/Time:  Friday January 21 2018 16:42:14 EDT Ventricular Rate:  64 PR Interval:    QRS Duration: 100 QT Interval:  401 QTC Calculation: 414 R Axis:   -13 Text Interpretation:  Sinus rhythm Prolonged PR interval RSR' in V1 or V2, probably normal variant Nonspecific T abnormalities, lateral leads When compared to prior,  no significant changes seen.  No STEMI Confirmed by Theda Belfast ([id number]) on 01/21/2018 5:34:01 PM   Radiology Mr Brain Wo Contrast  Result Date:  01/21/2018 CLINICAL DATA:  47 y/o F; left-sided weakness and stuttering speech. EXAM: MRI HEAD WITHOUT CONTRAST TECHNIQUE: Multiplanar, multiecho pulse sequences of the brain and surrounding structures were obtained without intravenous contrast. COMPARISON:  None. FINDINGS: Brain: No acute infarction, hemorrhage, hydrocephalus, extra-axial collection or mass lesion. Vascular: Normal flow voids. Skull and upper cervical spine: Normal marrow signal. Sinuses/Orbits: Partial opacification of anterior ethmoid and left frontal sinuses. Otherwise negative. Other: None. IMPRESSION: No acute intracranial abnormality. Unremarkable MRI of the brain for age. Electronically Signed   By: Mitzi Hansen M.D.   On: 01/21/2018 19:48   Ct Head Code Stroke Wo Contrast  Result Date: 01/21/2018 CLINICAL DATA:  Code stroke.  Aphasia and right-sided droop EXAM: CT HEAD WITHOUT CONTRAST TECHNIQUE: Contiguous axial images were obtained from the base of the skull through the vertex without intravenous contrast. COMPARISON:  11/07/2017 FINDINGS: Brain: No evidence of acute infarction, hemorrhage, hydrocephalus, extra-axial collection or mass lesion/mass effect. Vascular: No hyperdense vessel or unexpected calcification. Skull: Normal. Negative for fracture or focal lesion. Sinuses/Orbits: Partial opacification of the left frontal sinus. Other: These results were communicated to Dr. Laurence Slate at 4:36 pmon 4/26/2019by text page via the [employer] messaging system. ASPECTS [facility] Stroke Program Early CT Score) - Ganglionic level infarction (caudate, lentiform nuclei, internal capsule, insula, M1-M3 cortex): 7 - Supraganglionic infarction (M4-M6 cortex): 3 Total score (0-10 with 10 being normal): 10 IMPRESSION: Negative head CT.  ASPECTS is 10. Electronically Signed   By: Marnee Spring M.D.   On: 01/21/2018 16:36    Procedures Procedures (including critical care time)  Medications Ordered in ED Medications - No data to  display   Initial Impression / Assessment and Plan / ED Course  I have reviewed the triage vital signs and the nursing notes.  Pertinent labs & imaging results that were available during my care of the patient were reviewed by me and considered in my medical decision making (see chart for details).     Dalayla Aldredge is a 47 y.o. female with a past medical history significant for hypertension, diabetes, hyperlipidemia, obesity, and prior stroke who presents with neurologic deficits.  Patient comes to the ED after having an onset at 10 AM of right-sided weakness in her right arm,  right leg, and right face.  Patient also reports numbness on the right side.  She reports she had similar symptoms in the past when she had a stroke.  She reports having some difficulty speaking.  She says that all her symptoms began at 10 AM while at her doctor's office.  She reports that she has been feeling some fatigue over the last few days but denies any other significant symptoms.  Patient was made a code stroke in triage.    Patient does report she has had some headaches with her symptoms.  Neurology are to be and seen the patient prior to my evaluation.  On my exam, patient had subjective weakness in her right arm, right leg, and right face however most of these findings were distractible and resolves when patient was speaking.  Patient had symmetric grip strength on my exam.  Patient did report numbness in her right face, right arm, right leg..  Patient in normal extraocular movements.  Lungs clear and chest nontender.  Back nontender.  Abdomen nontender.  Patient had pulses in all extremities.  Neurology saw the patient and suspect possible recurrence of the conversion disorder.  They do however request MRI to further evaluate.  If MRI is negative, neurology suggest discharge.  Complicated migraine also consider given a headache in neurologic symptoms.  MRI is negative.  Patient had improvement of her  symptoms.  Given the neurology recognitions, patient will be discharged for outpatient neurology follow-up.  Patient agreed with plan of care and was discharged in good condition.  Final Clinical Impressions(s) / ED Diagnoses   Final diagnoses:  Slurred speech  Numbness    ED Discharge Orders    None      Clinical Impression: 1. Slurred speech   2. Numbness     Disposition: Discharge  Condition: Good  I have discussed the results, Dx and Tx plan with the pt(& family if present). He/she/they expressed understanding and agree(s) with the plan. Discharge instructions discussed at great length. Strict return precautions discussed and pt &/or family have verbalized understanding of the instructions. No further questions at time of discharge.    Discharge Medication List as of 01/21/2018 10:11 PM      Follow Up: Massie Maroon, FNP [address] [place] [address] [phone]     [facility] [address] [place] [address] [phone]       Kaiden Pech, Canary Brim, MD 01/22/18 [id number]

## 2018-01-21 NOTE — ED Notes (Signed)
Patient forcibly slurs speech when talking, when distracted speech changes back to normal.

## 2018-01-21 NOTE — ED Notes (Signed)
Code stroke activated ?

## 2018-01-21 NOTE — ED Triage Notes (Signed)
Pt in c/o R sided arm tingling onset today @ 10am, pt c/o R sided arm tingling today, pt A&O x4, answers questions appropriately

## 2018-01-21 NOTE — ED Notes (Signed)
[place], PA accompanied pt to CT

## 2018-01-21 NOTE — ED Provider Notes (Addendum)
Patient placed in Quick Look pathway, seen and evaluated   Chief Complaint: Aphasia  HPI:   47 y.o. with past medical history of hypertension, diabetes, obesity who presents for evaluation of aphasia and facial drooping that began approximately 10 AM.  Patient was at her doctor's office when the symptoms began.  Patient reports that other people in the office were stating that they were having a hard time understanding her and patient states that she felt like she was slurring her words.  Additionally patient states that she was experiencing some right upper extremity weakness.  Patient denies any preceding trauma, injury.  Patient had a similar episode in February 2019 that presented with similar symptoms.  She was seen in the ED and had a negative CT head at that time.  MRI revealed no acute abnormality's.  Patient's symptoms were thought to be due to conversion disorder.  Patient reports she has not been under any stress or anxiety recently.  Patient states she did have some minor chest pain earlier today but states that has resolved.  Patient denies any vision changes, numbness of her extremities.  ROS: Aphasia, RUE Weakness   Physical Exam:   Gen: No distress  Neuro: Awake and Alert  Skin: Warm  Neuro:   Right-sided facial drooping noted. Slurred speech noted.    Follows commands, Moves all extremities    5/5 strength to LUE and BLE, decreased strength noted to right upper extremity.  Decreased grip strength in right   upper extremity.   Decreased sensation in his right side of face.   Tremulous on finger to nose.   No dysdiadochokinesia.   Right upper extremity dressed but does not pronate.   When attempting to ambulate, patient does drag her right foot       Large Artery Stroke Screening     Weakness:  Mild (minor drift)  Vision:  NONE  Aphasia:  Expressive  Neglect:  NONE:  VAN =  Positive  If patient has any weakness PLUS any one of the below: Visual Disturbance (field  cut, double, or blind vision) Aphasia (inability to speak or understand) Neglect (gaze to one side or ignoring one side) This is likely a large artery clot (cortical symptoms) = VAN Positive   Given onset of symptoms and physical exam findings, a code stroke was initiated.  I personally went with patient down to CT scanner where the neuro PA examined patient.  Initiation of care has begun. The patient has been counseled on the process, plan, and necessity for staying for the completion/evaluation, and the remainder of the medical screening examination  Discussed with Onalee Huaavid (neuro PA) after evaluation.  No indication for TPA at this time.  Recommends proceeding with MRI.  ED attending updated on plan.  Maxwell CaulLayden, Lindsey A, PA-C 01/21/18 1627    Maxwell CaulLayden, Lindsey A, PA-C 01/21/18 1643    Margarita Grizzleay, Danielle, MD 01/22/18 [phone]

## 2018-01-27 ENCOUNTER — Encounter: Payer: Self-pay | Admitting: *Deleted

## 2018-01-27 ENCOUNTER — Ambulatory Visit: Payer: PRIVATE HEALTH INSURANCE | Attending: Neurology | Admitting: *Deleted

## 2018-01-27 ENCOUNTER — Encounter: Payer: Self-pay | Admitting: Physical Therapy

## 2018-01-27 ENCOUNTER — Ambulatory Visit: Payer: PRIVATE HEALTH INSURANCE | Admitting: Physical Therapy

## 2018-01-27 VITALS — BP 127/94 | HR 59

## 2018-01-27 DIAGNOSIS — M6281 Muscle weakness (generalized): Secondary | ICD-10-CM | POA: Insufficient documentation

## 2018-01-27 DIAGNOSIS — R2681 Unsteadiness on feet: Secondary | ICD-10-CM

## 2018-01-27 DIAGNOSIS — R2689 Other abnormalities of gait and mobility: Secondary | ICD-10-CM

## 2018-01-27 DIAGNOSIS — R278 Other lack of coordination: Secondary | ICD-10-CM | POA: Insufficient documentation

## 2018-01-27 NOTE — Therapy (Signed)
[facility] [address] [place], [place], [address] Phone: [phone]   Fax:  [phone]  Occupational Therapy Treatment  Patient Details  Name: Shelley Mccoy MRN: [id number] Date of Birth: [birth year] Referring Provider: Dr. Rosalin Hawking   Encounter Date: 01/27/2018  OT End of Session - 01/27/18 1009    Visit Number  3    Number of Visits  17    Authorization Type  awaiting insurance verification ?[employer]. Pt reports tha tshe has [employer] - not yet verified.    OT Start Time  0930    OT Stop Time  0959    OT Time Calculation (min)  29 min    Activity Tolerance  Patient tolerated treatment well    Behavior During Therapy  Flat affect       Past Medical History:  Diagnosis Date  . Anemia   . Asthma   . Diabetes mellitus without complication ([place])   . Heart murmur   . History of uterine fibroid   . HSV-2 (herpes simplex virus 2) infection   . Hypertension   . Seasonal allergies     Past Surgical History:  Procedure Laterality Date  . CYSTECTOMY     hand and breast  . LAPAROSCOPIC ABDOMINAL EXPLORATION    . TOTAL ABDOMINAL HYSTERECTOMY      There were no vitals filed for this visit.  Subjective Assessment - 01/27/18 0933    Subjective   Pt reports that she had not been to previous OT appointments as she was waiting for insurance authorization/coverage. Per chart review, insurance states [employer] and pt was informed that she may be responsible for charges if insurance doens't cover, pt wishes to proceed with re assessment.     Pertinent History  Stroke-like symptoms, MRI negative, probable conversion disorder 11/07/17 & went to ER again on 01/21/18; anemia, asthma, DM, HTN, hyperlipidemia, hx of Bells palsy per pt report (but speech changes returned to baseline quickly per pt report)      Limitations  fall risk    Patient Stated Goals  to get better    Currently in Pain?  No/denies    Pain Score  0-No  pain    Multiple Pain Sites  No        [facility] OT Assessment - 01/27/18 0001      Coordination   9 Hole Peg Test  Left;Right    Right 9 Hole Peg Test  25.25 sec    Left 9 Hole Peg Test  23.43      ROM / Strength   AROM / PROM / Strength  AROM;Strength      Strength   Overall Strength  Deficits;Other (comment) WFL's    Overall Strength Comments  UE strength testing inconsistent with improved strength noted when testing BUEs at same time vs. RUE in isolation    Right Shoulder Flexion  3+/5    Right Shoulder ABduction  3+/5    Right Shoulder Horizontal ABduction  4/5    Right Shoulder Horizontal ADduction  4-/5    Right/Left Elbow  Right    Right Elbow Flexion  5/5    Right Elbow Extension  4/5      Hand Function   Right Hand Grip (lbs)  37,62,83    Right Hand Lateral Pinch  13 lbs    Right Hand 3 Point Pinch  10.5 lbs    Left Hand Grip (lbs)  44 x3    Left Hand Lateral  Pinch  13 lbs    Left 3 point pinch  12 lbs               OT Treatments/Exercises (OP) - 01/27/18 0001      ADLs   Overall ADLs  Pt reports Independence with all ADL's. Denies any difficulty with bathing, dressing, cooking, cleaning, housework, opening bottles, jars, buttons, zippers, handwriting as well as job related tasks.     ADL Comments  Independence with all ADL's per pt report. Verbal discussion with pt  re: previous Short/Long term goals for OT. Pt verbalizes that she is Independent with ADL's and work related tasks. Discussed pt goals at which time pt expressed wish to d/c from out-pt OT and continue only with PT at this time. Pt was educated to continue HEP/coordination I'ly and call with any questions/concerns and will plan to d/c at this time. She verbalized agreement with this plan.      Exercises   Exercises  Hand;Theraputty      Theraputty   Theraputty - Grip  -- Verbal reivew of HEP/putty. Pt reports I/denies difficulty      Functional Reaching Activities   Mid Level   Reach/lift/carry 40# in crate from counter to tabletop for work simulated tasks. Pt was noted to use good body mechanics/safety. 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 Pt demonstrated good technique & demonstrated x2      Fine Motor Coordination (Hand/Wrist)   Fine Motor Coordination  -- Pt reports Independence w/ all coord HEP            OT Education - 01/27/18 1008    Education provided  Yes    Education Details  Cont HEP as previoulsy issued for strengthening/coordination. D/c plan.    Person(s) Educated  Patient    Methods  Explanation;Demonstration    Comprehension  Verbalized understanding;Returned demonstration       OT Short Term Goals - 01/27/18 1029      OT SHORT TERM GOAL #1   Title  Pt will be independent with initial HEP.--check STGs 12/22/17    Time  4    Period  Weeks    Status  Achieved      OT SHORT TERM GOAL #2   Title  Pt will perform BADLs using RUE as dominant UE 100% of the time.    Time  4    Period  Weeks    Status  Achieved      OT SHORT TERM GOAL #3   Title  Pt will perform mod complex cleaning tasks for at least 75mn without rest breaks and using good safety.    Time  4    Period  Weeks    Status  Achieved Per pt report/discussion in clinic      OT SHORT TERM GOAL #4   Title  Pt will improve dominant RUE coordination for ADLs as shown by completing 9-hole peg test in 22sec or less.    Baseline  Eval = 28sec; 01/27/18 = 25.25 seconds    Time  4    Period  Weeks    Status  Achieved      OT SHORT TERM GOAL #5   Title  Pt will improve R grip strength for ADLs to at least 20lbs over 3 trials.    Baseline  Eval = 5, 10, 10lbs; 01/27/18 = 30, 34, 34#    Time  4    Period  Weeks  Status  Achieved        OT Long Term Goals - 01/27/18 1031      OT LONG TERM GOAL #1   Title  Pt will be independent with updated HEP.--check LTGs 01/22/18    Time  8    Period  Weeks    Status  Achieved      OT LONG TERM GOAL #2   Title   Pt will perform mod complex cooking tasks with good safety.    Time  8    Period  Weeks    Status  On-going Pt reports that she met this goal/not assessed by OT      OT LONG TERM GOAL #3   Title  Pt will improve R grip strength for ADLs to at least 30lbs over 3 trials.    Baseline  Eval = 5, 10, 10lbs; 01/27/18 = 30, 34, 34#    Time  8    Period  Weeks    Status  Achieved      OT LONG TERM GOAL #4   Title  Pt will be able to lift/move at least 40lbs with BUEs utilizing good body mechanics/safety for work tasks.    Baseline  40# from countertop to tabletop x2 trials using good technique/mechanics/safety    Time  8    Period  Weeks    Status  Achieved      OT LONG TERM GOAL #5   Title  Pt will report RUE pain less than or equal to 2/10 for ADLs.    Baseline  01/27/18 = 0/10    Time  8    Period  Weeks    Status  Achieved            Plan - 01/27/18 1010    Clinical Impression Statement  Pt was re-assessed and STG's/LTG's reviewed after not being seen for ~4weeks. Pt went to ER last Fri with symptoms of aphasia but no change in RUE coordination/strength per pt report. MRI/CT were negative, but pt presented with speech difficulty, LE weakness/numbness and headaches per chart review. Also per chart reivew, neurology examined pt and suspected possible recurrance of conversion disorder vs complicated migraine. Pt denies and difficulty with ADL's, IADL's or work related tasks at this time. She reports independence with OT HEP and coordination tasks. She has met 4/5 STG's and 4/5 LTG's expressed desire to d/c from OT at this time and continue with HEP I'ly. Will therefore d/c from Out-pt OT.    Occupational Profile and client history currently impacting functional performance  Pt states that she is independent all ADL's, IADL's & work related tasks. She also reports driving & working full time.    Occupational performance deficits (Please refer to evaluation for details):   ADL's;IADL's;Work;Leisure    Rehab Potential  Good    OT Frequency  2x / week    OT Duration  8 weeks +Eval    OT Treatment/Interventions  Self-care/ADL training;Therapeutic exercise;Patient/family education;Neuromuscular education;Moist Heat;Functional Furniture conservator/restorer;Therapeutic activities;Balance training;Cognitive remediation/compensation;Manual Therapy;DME and/or AE instruction;Cryotherapy    Plan  D/c at this time following re-assessment and pt request/discussion.    Clinical Decision Making  Limited treatment options, no task modification necessary    Recommended Other Services  Current with PT    Consulted and Agree with Plan of Care  Patient      OCCUPATIONAL THERAPY DISCHARGE SUMMARY  Visits from Start of Care: 3 out of 8. Pt cancelled some appointments secondary to waiting on  insurance coverage. Pt has currently met 4/5 STG's and 4/5 LTG's and expressed wish to d/c from out-pt OT at this time.  Current functional level related to goals / functional outcomes: Pt is overall Mod I all ADL's, IADL's and work related tasks per her report and re-assessment in clinic today. Please see treatment note above.   Remaining deficits: n/a   Education / Equipment: HEP, putty  Plan: Patient agrees to discharge.  Patient goals were partially met. Patient is being discharged due to being pleased with the current functional level.  ?????       Patient will benefit from skilled therapeutic intervention in order to improve the following deficits and impairments:  Decreased cognition, Decreased knowledge of use of DME, Pain, Decreased mobility, Decreased coordination, Decreased activity tolerance, Decreased endurance, Decreased strength, Impaired tone, Impaired UE functional use, Impaired perceived functional ability, Decreased safety awareness, Decreased balance, Decreased knowledge of precautions  Visit Diagnosis: Muscle weakness (generalized)  Other lack of coordination    Problem  List Patient Active Problem List   Diagnosis Date Noted  . Morbid obesity with BMI of 40.0-44.9, adult ([place]) 12/01/2017  . Type 2 diabetes mellitus without complication, without long-term current use of insulin ([place]) 12/01/2017  . Essential hypertension 12/01/2017  . Hyperlipidemia LDL goal <100 12/01/2017  . 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 [place], [place], [address] Phone: [phone]   Fax:  [phone]  Name: Shelley Mccoy MRN: [id number] Date of Birth: 1971/04/18

## 2018-01-27 NOTE — Therapy (Addendum)
[facility] [address] [place], [place], [address] Phone: [phone]   Fax:  [phone]  Physical Therapy Treatment  Patient Details  Name: Shelley Mccoy MRN: [id number] Date of Birth: [birth year] Referring Provider: Dr. Rosalin Hawking   Encounter Date: 01/27/2018  PT End of Session - 01/27/18 2235    Visit Number  4    Number of Visits  17    Date for PT Re-Evaluation  01/22/18    Authorization Type  [employer]-awaiting insurance verification    PT Start Time  0845    PT Stop Time  0930    PT Time Calculation (min)  45 min    Equipment Utilized During Treatment  Gait belt    Activity Tolerance  Patient limited by fatigue    Behavior During Therapy  [facility] for tasks assessed/performed       Past Medical History:  Diagnosis Date  . Anemia   . Asthma   . Diabetes mellitus without complication ([place])   . Heart murmur   . History of uterine fibroid   . HSV-2 (herpes simplex virus 2) infection   . Hypertension   . Seasonal allergies     Past Surgical History:  Procedure Laterality Date  . CYSTECTOMY     hand and breast  . LAPAROSCOPIC ABDOMINAL EXPLORATION    . TOTAL ABDOMINAL HYSTERECTOMY      Vitals:   01/27/18 0904  BP: (!) 127/94  Pulse: (!) 59    Subjective Assessment - 01/27/18 0845    Subjective  She went to ED on 01/21/2018 with slurred speech and right leg weakness. She was discharged with possible Bell's Palsy. PCP in [place] is Cammie Sickle, FNP with appointment 03/03/2018.     Patient Stated Goals  Pt's goals for therapy is to get better, back to normal.    Currently in Pain?  No/denies         [facility] PT Assessment - 01/27/18 0845      Ambulation/Gait   Ambulation/Gait  Yes    Ambulation/Gait Assistance  5: Supervision    Ambulation Distance (Feet)  1400 Feet    Assistive device  None    Gait Pattern  Step-through pattern;Decreased arm swing - right;Decreased step length - left;Decreased stance  time - right;Decreased stride length;Trendelenburg    Ambulation Surface  Indoor;Level    Gait velocity  9.12 = 3.60 ft/sec comfortable & 9.07 = 3.62 ft/sec fast pace    Stairs  Yes    Stairs Assistance  5: Supervision    Stair Management Technique  One rail Left;Step to pattern;Forwards    Number of Stairs  4    Ramp  5: Supervision no device    Curb  5: Supervision no device      6 Minute Walk- Baseline   6 Minute Walk- Baseline  yes    BP (mmHg)  127/94  (Abnormal)     HR (bpm)  59      6 Minute walk- Post Test   6 Minute Walk Post Test  yes    BP (mmHg)  135/90    HR (bpm)  61    Modified Borg Scale for Dyspnea  2- Mild shortness of breath    Perceived Rate of Exertion (Borg)  12-      6 minute walk test results    Aerobic Endurance Distance Walked  1319      Dynamic Gait Index   Level Surface  Moderate Impairment  Change in Gait Speed  Severe Impairment    Gait with Horizontal Head Turns  Moderate Impairment    Gait with Vertical Head Turns  Moderate Impairment    Gait and Pivot Turn  Mild Impairment    Step Over Obstacle  Moderate Impairment    Step Around Obstacles  Mild Impairment    Steps  Moderate Impairment    Total Score  9      Functional Gait  Assessment   Gait assessed   Yes    Gait Level Surface  Walks 20 ft in less than 7 sec but greater than 5.5 sec, uses assistive device, slower speed, mild gait deviations, or deviates 6-10 in outside of the 12 in walkway width.    Change in Gait Speed  Cannot change speeds, deviates greater than 15 in outside 12 in walkway width, or loses balance and has to reach for wall or be caught.    Gait with Horizontal Head Turns  Performs head turns with moderate changes in gait velocity, slows down, deviates 10-15 in outside 12 in walkway width but recovers, can continue to walk.    Gait with Vertical Head Turns  Performs task with moderate change in gait velocity, slows down, deviates 10-15 in outside 12 in walkway width but  recovers, can continue to walk.    Gait and Pivot Turn  Pivot turns safely in greater than 3 sec and stops with no loss of balance, or pivot turns safely within 3 sec and stops with mild imbalance, requires small steps to catch balance.    Step Over Obstacle  Is able to step over one shoe box (4.5 in total height) but must slow down and adjust steps to clear box safely. May require verbal cueing.    Gait with Narrow Base of Support  Ambulates less than 4 steps heel to toe or cannot perform without assistance.    Gait with Eyes Closed  Walks 20 ft, slow speed, abnormal gait pattern, evidence for imbalance, deviates 10-15 in outside 12 in walkway width. Requires more than 9 sec to ambulate 20 ft.    Ambulating Backwards  Walks 20 ft, slow speed, abnormal gait pattern, evidence for imbalance, deviates 10-15 in outside 12 in walkway width.    Steps  Two feet to a stair, must use rail.    Total Score  10                           PT Education - 01/27/18 0900    Education provided  Yes    Education Details  Fall prevention strategies    Person(s) Educated  Patient    Methods  Explanation;Verbal cues    Comprehension  Verbalized understanding       PT Short Term Goals - 12/21/17 1028      PT SHORT TERM GOAL #1   Title  Pt will be independent with HEP for improved strength, balance, and gait.  TARGET 12/17/17    Status  Achieved      PT SHORT TERM GOAL #2   Title  Pt will perform at least 8 of 10 reps of sit<>stand transfers without UE support for improved lower extremity strength and transfer ability.    Baseline  met 12-20-16    Status  Achieved      PT SHORT TERM GOAL #3   Title  Pt will improve DGI score to at least 15/24 for decreased fall risk.  Status  Achieved      PT SHORT TERM GOAL #4   Title  Pt will negotiate at least 4 steps with one handrail step-through pattern, modified independently.    Baseline  met 12-20-17    Status  Achieved        PT Long  Term Goals - 01/27/18 2236      PT LONG TERM GOAL #1   Title  Pt will verbalize understanding of fall prevention in home environment.  TARGET 01/21/18    Baseline  MET 01/27/2018    Time  9    Period  Weeks    Status  Achieved      PT LONG TERM GOAL #2   Title  Pt will improve gait velocity to at least 2.62 ft/sec for improved gait efficiency and safety.    Baseline  MET 01/27/2018  Gait Velocity 3.60 ft/sec comfortable    Time  9    Period  Weeks    Status  Achieved      PT LONG TERM GOAL #3   Title  Pt will improve DGI score to at least 19/24 for decreased fall risk.  (updated target date 02/25/2018)    Baseline  NOT MET 01/27/2018  DGI 9/24    Time  9    Period  Weeks    Status  On-going    Target Date  02/25/18      PT LONG TERM GOAL #4   Title  Pt will ambulate at least 1000 ft, indoor and outdoor surfaces, independently, for improved community gait.    Baseline  Partially MET 01/27/2018 Patient ambulates 1400' on indoor surfaces.     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     Time  4    Period  Weeks    Status  New    Target Date  02/25/18            Plan - 01/27/18 2239    Clinical Impression Statement  Patient had episode of RLE weakness & slurred speech with ED visit and diagnosis of recurrence of Conversion Disorder. She met 3 of 4 LTGs. Dynamic Gait Index declined to 9/24 (initial evaluation was 11/24 and improved on 3/25/219 to 20/24). She reported insurance issues was reason for poor attendance and now has new insurance with PT coverage.  Original plan of care was 8 weeks and today was week 3  of care. 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       Rehab Potential  Good    Clinical Impairments Affecting Rehab Potential  ?conversion disorder     PT Frequency  2x / week    PT Duration  8 weeks    PT Treatment/Interventions  ADLs/Self Care Home Management;Balance training;DME Instruction;Therapeutic exercise;Therapeutic activities;Functional mobility training;Gait training;Stair training;Neuromuscular re-education;Patient/family education    PT Next Visit Plan  work  on gait with scanning, negotiating barriers and compliant surfaces, high level balance activities   Consulted and Agree with Plan of Care  Patient       Patient will benefit from skilled therapeutic intervention in order to improve the following deficits and impairments:  Abnormal gait, Decreased balance, Decreased mobility, Decreased strength, Difficulty walking  Visit Diagnosis: Muscle weakness (generalized)  Other abnormalities of gait and mobility  Unsteadiness on feet     Problem List Patient Active Problem List   Diagnosis Date Noted  . Morbid obesity with BMI of 40.0-44.9, adult ([place]) 12/01/2017  . Type 2 diabetes mellitus without complication, without long-term current use of insulin ([place]) 12/01/2017  . Essential hypertension 12/01/2017  . Hyperlipidemia LDL goal <100 12/01/2017  . 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

## 2018-01-31 ENCOUNTER — Other Ambulatory Visit: Payer: Self-pay

## 2018-02-01 ENCOUNTER — Encounter: Payer: Self-pay | Admitting: Physical Therapy

## 2018-02-01 ENCOUNTER — Encounter: Payer: Self-pay | Admitting: Occupational Therapy

## 2018-02-01 ENCOUNTER — Ambulatory Visit: Payer: PRIVATE HEALTH INSURANCE | Admitting: Physical Therapy

## 2018-02-10 ENCOUNTER — Encounter: Payer: Self-pay | Admitting: *Deleted

## 2018-02-10 ENCOUNTER — Other Ambulatory Visit: Payer: Self-pay

## 2018-02-10 ENCOUNTER — Ambulatory Visit: Payer: PRIVATE HEALTH INSURANCE | Admitting: Physical Therapy

## 2018-02-14 ENCOUNTER — Other Ambulatory Visit: Payer: Self-pay

## 2018-03-03 ENCOUNTER — Ambulatory Visit: Payer: Self-pay | Admitting: Family Medicine

## 2018-08-03 ENCOUNTER — Ambulatory Visit (INDEPENDENT_AMBULATORY_CARE_PROVIDER_SITE_OTHER): Payer: Self-pay | Admitting: Obstetrics and Gynecology

## 2018-08-03 ENCOUNTER — Encounter: Payer: Self-pay | Admitting: Obstetrics and Gynecology

## 2018-08-03 ENCOUNTER — Other Ambulatory Visit: Payer: Self-pay

## 2018-08-03 VITALS — BP 118/76 | HR 81 | Ht 60.0 in | Wt 203.0 lb

## 2018-08-03 DIAGNOSIS — Z113 Encounter for screening for infections with a predominantly sexual mode of transmission: Secondary | ICD-10-CM

## 2018-08-03 DIAGNOSIS — Z01419 Encounter for gynecological examination (general) (routine) without abnormal findings: Secondary | ICD-10-CM

## 2018-08-03 MED ORDER — ESTRADIOL 0.1 MG/GM VA CREA: TOPICAL_CREAM | VAGINAL | 12 refills | Status: DC

## 2018-08-03 NOTE — Progress Notes (Signed)
Subjective:     Shelley Mccoy is a 47 y.o. female P3 with BMI 39 s/p surgical menopause who is here for a comprehensive physical exam. The patient reports no problems. She is sexually active with the same partner. She denies any pelvic pain or abnormal discharge. She desires STI screening. Patient desires refill on estrace cream. Patient has not had a mammogram in the past 12 months  Past Medical History:  Diagnosis Date  . Anemia   . Asthma   . Diabetes mellitus without complication (HCC)   . Heart murmur   . History of uterine fibroid   . HSV-2 (herpes simplex virus 2) infection   . Hypertension   . Seasonal allergies    Past Surgical History:  Procedure Laterality Date  . CYSTECTOMY     hand and breast  . LAPAROSCOPIC ABDOMINAL EXPLORATION    . TOTAL ABDOMINAL HYSTERECTOMY     Family History  Problem Relation Age of Onset  . Diabetes Mother   . Congestive Heart Failure Mother   . Kidney failure Mother   . Hypertension Mother   . Prostate cancer Father   . Hypertension Sister   . Diabetes Maternal Grandmother   . Congestive Heart Failure Maternal Grandmother   . Kidney failure Maternal Grandmother   . Alzheimer's disease Paternal Grandmother     Social History   Socioeconomic History  . Marital status: Single    Spouse name: Not on file  . Number of children: Not on file  . Years of education: Not on file  . Highest education level: Not on file  Occupational History  . Not on file  Social Needs  . Financial resource strain: Not on file  . Food insecurity:    Worry: Not on file    Inability: Not on file  . Transportation needs:    Medical: Not on file    Non-medical: Not on file  Tobacco Use  . Smoking status: Never Smoker  . Smokeless tobacco: Never Used  Substance and Sexual Activity  . Alcohol use: No  . Drug use: No  . Sexual activity: Not on file  Lifestyle  . Physical activity:    Days per week: Not on file    Minutes per session: Not on file   . Stress: Not on file  Relationships  . Social connections:    Talks on phone: Not on file    Gets together: Not on file    Attends religious service: Not on file    Active member of club or organization: Not on file    Attends meetings of clubs or organizations: Not on file    Relationship status: Not on file  . Intimate partner violence:    Fear of current or ex partner: Not on file    Emotionally abused: Not on file    Physically abused: Not on file    Forced sexual activity: Not on file  Other Topics Concern  . Not on file  Social History Narrative  . Not on file   Health Maintenance  Topic Date Due  . PNEUMOCOCCAL POLYSACCHARIDE VACCINE AGE 82-64 HIGH RISK  05/08/1971  . OPHTHALMOLOGY EXAM  05/08/1979  . INFLUENZA VACCINE  04/28/2018  . HEMOGLOBIN A1C  08/11/2018  . FOOT EXAM  02/09/2019  . PAP SMEAR  07/05/2020  . TETANUS/TDAP  08/04/2027  . HIV Screening  Completed       Review of Systems Pertinent items are noted in HPI.   Objective:  Blood  pressure 118/76, pulse 81, height 5' (1.524 m), weight 203 lb (92.1 kg).     GENERAL: Well-developed, well-nourished female in no acute distress.  HEENT: Normocephalic, atraumatic. Sclerae anicteric.  NECK: Supple. Normal thyroid.  LUNGS: Clear to auscultation bilaterally.  HEART: Regular rate and rhythm. BREASTS: Symmetric in size. No palpable masses or lymphadenopathy, skin changes, or nipple drainage. ABDOMEN: Soft, nontender, nondistended. No organomegaly. PELVIC: Normal external female genitalia. Vagina is pale and rugated.  Normal discharge. No adnexal mass or tenderness. EXTREMITIES: No cyanosis, clubbing, or edema, 2+ distal pulses.    Assessment:    Healthy female exam.      Plan:    Screening mammogram ordered STI testing per patient request Patient will be contacted with abnormal results See After Visit Summary for Counseling Recommendations

## 2018-08-04 LAB — HIV ANTIBODY (ROUTINE TESTING W REFLEX): HIV Screen 4th Generation wRfx: NONREACTIVE

## 2018-08-04 LAB — RPR: RPR Ser Ql: NONREACTIVE

## 2018-08-04 LAB — HEPATITIS B SURFACE ANTIGEN: Hepatitis B Surface Ag: NEGATIVE

## 2018-08-04 LAB — HEPATITIS C ANTIBODY: Hep C Virus Ab: <0.1 s/co ratio (ref 0.0–0.9)

## 2018-08-05 LAB — CERVICOVAGINAL ANCILLARY ONLY
Chlamydia: NEGATIVE
Neisseria Gonorrhea: NEGATIVE
Trichomonas: NEGATIVE

## 2018-11-02 ENCOUNTER — Encounter: Payer: Self-pay | Admitting: Physical Therapy

## 2018-11-02 NOTE — Therapy (Signed)
[facility] [address] [place], [place], [address] Phone: [phone]   Fax:  [phone]  Patient Details  Name: Shelley Mccoy MRN: [id number] Date of Birth: [birth year] Referring Provider:  Rosalin Hawking, MD  Encounter Date: 11/02/2018  PHYSICAL THERAPY DISCHARGE SUMMARY  Visits from Start of Care: 4  Current functional level related to goals / functional outcomes: See last PT note on 01/27/2018   Remaining deficits: Pt hospitalized with medical change.    Education / Equipment: HEP  Plan: Patient agrees to discharge.  Patient goals were not met. Patient is being discharged due to a change in medical status.  ?????       Jamey Reas PT, DPT 11/02/2018, 2:27 PM  [facility] [address] [place], [place], [address] Phone: [phone]   Fax:  [phone]

## 2018-12-18 ENCOUNTER — Other Ambulatory Visit: Payer: Self-pay

## 2018-12-18 ENCOUNTER — Encounter (HOSPITAL_COMMUNITY): Payer: Self-pay | Admitting: Emergency Medicine

## 2018-12-18 ENCOUNTER — Emergency Department (HOSPITAL_COMMUNITY)
Admission: EM | Admit: 2018-12-18 | Discharge: 2018-12-18 | Disposition: A | Discharge status: Home / Self Care | Payer: Self-pay | Attending: Emergency Medicine | Admitting: Emergency Medicine

## 2018-12-18 ENCOUNTER — Emergency Department (HOSPITAL_COMMUNITY): Payer: Self-pay

## 2018-12-18 DIAGNOSIS — Q670 Congenital facial asymmetry: Secondary | ICD-10-CM

## 2018-12-18 DIAGNOSIS — F449 Dissociative and conversion disorder, unspecified: Secondary | ICD-10-CM

## 2018-12-18 DIAGNOSIS — B009 Herpesviral infection, unspecified: Secondary | ICD-10-CM

## 2018-12-18 DIAGNOSIS — J45909 Unspecified asthma, uncomplicated: Secondary | ICD-10-CM | POA: Insufficient documentation

## 2018-12-18 DIAGNOSIS — R29810 Facial weakness: Secondary | ICD-10-CM | POA: Insufficient documentation

## 2018-12-18 DIAGNOSIS — I10 Essential (primary) hypertension: Secondary | ICD-10-CM | POA: Insufficient documentation

## 2018-12-18 DIAGNOSIS — E119 Type 2 diabetes mellitus without complications: Secondary | ICD-10-CM | POA: Insufficient documentation

## 2018-12-18 DIAGNOSIS — Z79899 Other long term (current) drug therapy: Secondary | ICD-10-CM | POA: Insufficient documentation

## 2018-12-18 LAB — I-STAT BETA HCG BLOOD, ED (MC, WL, AP ONLY): I-stat hCG, quantitative: <5 m[IU]/mL (ref <5)

## 2018-12-18 LAB — CBG MONITORING, ED
Glucose-Capillary: 89 mg/dL (ref 70–99)
Glucose-Capillary: 75 mg/dL (ref 70–99)

## 2018-12-18 LAB — CBC
HCT: 38.1 % (ref 36.0–46.0)
Hemoglobin: 11.9 g/dL — ABNORMAL LOW (ref 12.0–15.0)
MCH: 24.7 pg — ABNORMAL LOW (ref 26.0–34.0)
MCHC: 31.2 g/dL (ref 30.0–36.0)
MCV: 79 fL — ABNORMAL LOW (ref 80.0–100.0)
Platelets: 239 10*3/uL (ref 150–400)
RBC: 4.82 MIL/uL (ref 3.87–5.11)
RDW: 15.1 % (ref 11.5–15.5)
WBC: 4.6 10*3/uL (ref 4.0–10.5)
nRBC: 0 % (ref 0.0–0.2)

## 2018-12-18 LAB — DIFFERENTIAL
Abs Immature Granulocytes: 0.01 10*3/uL (ref 0.00–0.07)
Basophils Absolute: 0 10*3/uL (ref 0.0–0.1)
Basophils Relative: 0 %
Eosinophils Absolute: 0.2 10*3/uL (ref 0.0–0.5)
Eosinophils Relative: 3 %
Immature Granulocytes: 0 %
Lymphocytes Relative: 63 %
Lymphs Abs: 2.8 10*3/uL (ref 0.7–4.0)
Monocytes Absolute: 0.2 10*3/uL (ref 0.1–1.0)
Monocytes Relative: 5 %
Neutro Abs: 1.3 10*3/uL — ABNORMAL LOW (ref 1.7–7.7)
Neutrophils Relative %: 29 %

## 2018-12-18 LAB — COMPREHENSIVE METABOLIC PANEL
ALT: 18 U/L (ref 0–44)
AST: 17 U/L (ref 15–41)
Albumin: 4.1 g/dL (ref 3.5–5.0)
Alkaline Phosphatase: 98 U/L (ref 38–126)
Anion gap: 10 (ref 5–15)
BUN: 9 mg/dL (ref 6–20)
CO2: 25 mmol/L (ref 22–32)
Calcium: 9.6 mg/dL (ref 8.9–10.3)
Chloride: 106 mmol/L (ref 98–111)
Creatinine, Ser: 0.73 mg/dL (ref 0.44–1.00)
GFR calc Af Amer: >60 mL/min (ref >60)
GFR calc non Af Amer: >60 mL/min (ref >60)
Glucose, Bld: 90 mg/dL (ref 70–99)
Potassium: 3.1 mmol/L — ABNORMAL LOW (ref 3.5–5.1)
Sodium: 141 mmol/L (ref 135–145)
Total Bilirubin: 1.1 mg/dL (ref 0.3–1.2)
Total Protein: 7.9 g/dL (ref 6.5–8.1)

## 2018-12-18 LAB — PROTIME-INR
INR: 1 (ref 0.8–1.2)
Prothrombin Time: 13.5 seconds (ref 11.4–15.2)

## 2018-12-18 LAB — I-STAT CREATININE, ED: Creatinine, Ser: 0.7 mg/dL (ref 0.44–1.00)

## 2018-12-18 LAB — APTT: aPTT: 29 seconds (ref 24–36)

## 2018-12-18 MED ORDER — PREDNISONE 20 MG PO TABS: 40.0000 mg | ORAL_TABLET | Freq: Every day | ORAL | 0 refills | Status: DC

## 2018-12-18 MED ORDER — PREDNISONE 20 MG PO TABS
60.0000 mg | ORAL_TABLET | ORAL | Status: AC
  Administered 2018-12-18: 60 mg via ORAL
  Filled 2018-12-18: qty 3

## 2018-12-18 MED ORDER — VALACYCLOVIR HCL 500 MG PO TABS
1000.0000 mg | ORAL_TABLET | Freq: Once | ORAL | Status: AC
  Administered 2018-12-18: 1000 mg via ORAL
  Filled 2018-12-18: qty 2

## 2018-12-18 MED ORDER — VALACYCLOVIR HCL 1 G PO TABS: 1000.0000 mg | ORAL_TABLET | Freq: Three times a day (TID) | ORAL | 0 refills | Status: DC

## 2018-12-18 MED ORDER — SODIUM CHLORIDE 0.9% FLUSH: 3.0000 mL | Freq: Once | INTRAVENOUS | Status: DC

## 2018-12-18 NOTE — ED Triage Notes (Signed)
Pt arrives to ED as a Code Stroke with [facility] from work with complaints of left sided facial droop and slurred speech with a LKN at 1200. Pt examined at bridge by EDP and rushed to CT. EMS reports hx of Bells Palsy.

## 2018-12-18 NOTE — ED Notes (Signed)
Pt's CBG result was 75. Informed [facility] - RN.  

## 2018-12-18 NOTE — ED Notes (Signed)
Gave pt [place] sandwich, applesauce & Ginger Ale, per [facility] - RN.

## 2018-12-18 NOTE — Discharge Instructions (Signed)
As discussed, your evaluation today has been largely reassuring.  But, it is important that you monitor your condition carefully, and do not hesitate to return to the ED if you develop new, or concerning changes in your condition. ? ?Otherwise, please follow-up with your physician for appropriate ongoing care. ? ?

## 2018-12-18 NOTE — ED Provider Notes (Signed)
[facility] EMERGENCY DEPARTMENT Provider Note   CSN: [id number] Arrival date & time: 12/18/18  7308  An emergency department physician performed an initial assessment on this suspected stroke patient at 38.  History   Chief Complaint Chief Complaint  Patient presents with  . Code Stroke    HPI Shelley Mccoy is a 48 y.o. female.     HPI Patient presents as a code stroke. Patient seen and evaluated on arrival, and my initial evaluation was of the patient while she was in the CT scan machine. Patient's case conducted with our neurology colleagues. Patient reports to me that about 2 hours prior to ED arrival she suddenly had some facial asymmetry, difficulty of speech. She notes that she was at work, and was sent here for evaluation. She notes that she has had similar episodes of difficulty with speech and facial asymmetry multiple times in the past. She denies pain, denies confusion, denies lightheadedness, has had no syncope, has no new focal weakness in her extremities.  Past Medical History:  Diagnosis Date  . Anemia   . Asthma   . Bell's palsy   . Diabetes mellitus without complication (HCC)   . Heart murmur   . History of uterine fibroid   . HSV-2 (herpes simplex virus 2) infection   . Hypertension   . Seasonal allergies     Patient Active Problem List   Diagnosis Date Noted  . Morbid obesity with BMI of 40.0-44.9, adult (HCC) 12/01/2017  . Type 2 diabetes mellitus without complication, without long-term current use of insulin (HCC) 12/01/2017  . Essential hypertension 12/01/2017  . Hyperlipidemia LDL goal <100 12/01/2017  . 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 Asthma 01/12/2013  . GERD (gastroesophageal reflux disease) 01/12/2013  . Antral gastritis 07/01/2012    Past Surgical History:  Procedure Laterality Date  . CYSTECTOMY     hand and breast  . LAPAROSCOPIC ABDOMINAL EXPLORATION    . TOTAL ABDOMINAL HYSTERECTOMY       OB  History    Gravida  4   Para  3   Term  3   Preterm      AB  1   Living  3     SAB  1   TAB      Ectopic      Multiple      Live Births  3            Home Medications    Prior to Admission medications   Medication Sig Start Date End Date Taking? Authorizing Provider  albuterol (VENTOLIN HFA) 108 (90 Base) MCG/ACT inhaler Inhale into the lungs every 6 (six) hours as needed for wheezing or shortness of breath.    Yes [provider]  amoxicillin (AMOXIL) 875 MG tablet Take 875 mg by mouth 2 (two) times daily. For 10 days 12/15/18  Yes [provider]  Ascorbic Acid (VITAMIN C PO) Take 1 tablet by mouth daily.   Yes [provider]  benzonatate (TESSALON) 200 MG capsule Take 200 mg by mouth 3 (three) times daily. 12/15/18  Yes [provider]  Ferrous Sulfate (IRON PO) Take 1 tablet by mouth daily.   Yes [provider]  fexofenadine (ALLER-EASE) 180 MG tablet Take 180 mg by mouth daily.   Yes [provider]  fluticasone-salmeterol (ADVAIR HFA) 115-21 MCG/ACT inhaler Inhale 2 puffs into the lungs 2 (two) times daily as needed (for flares).    Yes [provider]  gabapentin (NEURONTIN) 300 MG capsule Take 300 mg by mouth daily.    Yes [provider]  hydrochlorothiazide (HYDRODIURIL) 12.5 MG tablet Take 12.5 mg by mouth daily.   Yes [provider]  insulin glargine (LANTUS) 100 UNIT/ML injection Inject 18 Units into the skin at bedtime.    Yes [provider]  ipratropium (ATROVENT) 0.03 % nasal spray Place 2 sprays into both nostrils 2 (two) times daily. 12/15/18  Yes [provider]  losartan (COZAAR) 50 MG tablet Take 50 mg by mouth daily.   Yes [provider]  pravastatin (PRAVACHOL) 20 MG tablet Take 20 mg by mouth at bedtime.    Yes [provider]  sertraline (ZOLOFT) 50 MG tablet Take 50 mg by mouth at bedtime.    Yes [provider]   VITAMIN D PO Take 1 tablet by mouth daily.   Yes [provider]  estradiol (ESTRACE) 0.1 MG/GM vaginal cream Apply 1 gram per vagina every night for 2 weeks, then apply three times a week Patient not taking: Reported on 12/18/2018 08/03/18   Constant, Peggy, MD  valACYclovir (VALTREX) 500 MG tablet Take 1 tablet (500 mg total) by mouth daily. Patient not taking: Reported on 12/18/2018 12/01/17   Massie MaroonHollis, Lachina M, FNP    Family History Family History  Problem Relation Age of Onset  . Diabetes Mother   . Congestive Heart Failure Mother   . Kidney failure Mother   . Hypertension Mother   . Prostate cancer Father   . Hypertension Sister   . Diabetes Maternal Grandmother   . Congestive Heart Failure Maternal Grandmother   . Kidney failure Maternal Grandmother   . Alzheimer's disease Paternal Grandmother     Social History Social History   Tobacco Use  . Smoking status: Never Smoker  . Smokeless tobacco: Never Used  Substance Use Topics  . Alcohol use: No  . Drug use: No     Allergies   Percocet [oxycodone-acetaminophen]   Review of Systems Review of Systems  Constitutional:       Per HPI, otherwise negative  HENT:       Per HPI, otherwise negative  Respiratory:       Per HPI, otherwise negative  Cardiovascular:       Per HPI, otherwise negative  Gastrointestinal: Negative for vomiting.  Endocrine:       Negative aside from HPI  Genitourinary:       Neg aside from HPI   Musculoskeletal:       Per HPI, otherwise negative  Skin: Negative.   Neurological: Positive for facial asymmetry and numbness. Negative for syncope.     Physical Exam Updated Vital Signs BP 127/84 (BP Location: Right Arm)   Pulse 75   Temp 97.7 F (36.5 C) (Oral)   Resp 18   SpO2 100%   Physical Exam Vitals signs and nursing note reviewed.  Constitutional:      General: She is not in acute distress.    Appearance: She is well-developed.     Comments: Adult female in no  distress speaking in an animated fashion, though with some facial asymmetry, inconsistently.  HENT:     Head: Normocephalic and atraumatic.  Eyes:     Conjunctiva/sclera: Conjunctivae normal.  Cardiovascular:     Rate and Rhythm: Normal rate and regular rhythm.  Pulmonary:     Effort: Pulmonary effort is normal. No respiratory distress.     Breath sounds: Normal breath sounds. No stridor.  Abdominal:     General: There is no distension.  Skin:    General: Skin is warm and dry.  Neurological:     Mental Status: She is alert and oriented to person, place, and time.     Cranial Nerves: Facial asymmetry present. No cranial nerve deficit.     Comments: Speech is rapid, mostly clear, some facial asymmetry, with slight loss of the left nasolabial fold. No gross asymmetry of strength.      ED Treatments / Results  Labs (all labs ordered are listed, but only abnormal results are displayed) Labs Reviewed  CBC - Abnormal; Notable for the following components:      Result Value   Hemoglobin 11.9 (*)    MCV 79.0 (*)    MCH 24.7 (*)    All other components within normal limits  PROTIME-INR  APTT  DIFFERENTIAL  COMPREHENSIVE METABOLIC PANEL  I-STAT CREATININE, ED  CBG MONITORING, ED  I-STAT BETA HCG BLOOD, ED ([facility], [facility], [facility] ONLY)    EKG EKG Interpretation  Date/Time:  Sunday December 18 2018 13:24:17 EDT Ventricular Rate:  72 PR Interval:    QRS Duration: 101 QT Interval:  397 QTC Calculation: 435 R Axis:   -10 Text Interpretation:  Sinus rhythm Prolonged PR interval Low voltage, precordial leads RSR' in V1 or V2, right VCD or RVH Abnormal ekg Confirmed by Gerhard Munch [phone]) on 12/18/2018 1:27:34 PM   Radiology Ct Head Code Stroke Wo Contrast  Result Date: 12/18/2018 CLINICAL DATA:  Code stroke. Right-sided weakness and slurred speech EXAM: CT HEAD WITHOUT CONTRAST TECHNIQUE: Contiguous axial images were obtained from the base of the skull through the vertex without  intravenous contrast. COMPARISON:  MRI head 01/21/2018 FINDINGS: Brain: Ventricle size and cerebral volume normal. Negative for acute infarct or hemorrhage. 1 cm calcification left frontal dura unchanged from prior MRI and CT scans. Possible small meningioma or dural ossification. Vascular: Negative for hyperdense vessel Skull: Negative Sinuses/Orbits: Mild mucosal edema right maxillary sinus. Otherwise sinuses are clear. Normal orbit Other: None ASPECTS (Alberta Stroke Program Early CT Score) - Ganglionic level infarction (caudate, lentiform nuclei, internal capsule, insula, M1-M3 cortex): 7 - Supraganglionic infarction (M4-M6 cortex): 3 Total score (0-10 with 10 being normal): 10 IMPRESSION: 1. No acute abnormality. 2. ASPECTS is 10 Electronically Signed   By: Marlan Palau M.D.   On: 12/18/2018 13:25    Procedures Procedures (including critical care time)  Medications Ordered in ED Medications  sodium chloride flush (NS) 0.9 % injection 3 mL (has no administration in time range)     Initial Impression / Assessment and Plan / ED Course  I have reviewed the triage vital signs and the nursing notes.  Pertinent labs & imaging results that were available during my care of the patient were reviewed by me and considered in my medical decision making (see chart for details).  After the initial evaluation, with notation of the patient's history of Bell's palsy, absence of other acute new pathology, absence of distress, and after conversation with our neuro and without other acute findings, the patient's category was noted to be appropriate for translating the code stroke.  On repeat exam the patient is in no distress. This 48 year old female with a history of Bell's palsy, now presents with facial asymmetry, is otherwise generally well-appearing. No evidence for acute new stroke, no evidence for other acute new pathology, no distress, low suspicion for occult infection or other new findings. With  reassuring findings here the patient was  discharged with close outpatient follow-up with neurology.  Final Clinical Impressions(s) / ED Diagnoses   Final diagnoses:  Facial asymmetry    ED Discharge Orders         Ordered    valACYclovir (VALTREX) 1000 MG tablet  3 times daily     12/18/18 1527    predniSONE (DELTASONE) 20 MG tablet  Daily with breakfast     12/18/18 1527           Gerhard Munch, MD 12/18/18 1530

## 2018-12-18 NOTE — ED Notes (Signed)
Patient verbalizes understanding of discharge instructions. Opportunity for questioning and answers were provided. Armband removed by staff, pt discharged from ED.  

## 2018-12-18 NOTE — Consult Note (Signed)
Neurology Consultation Reason for Consult: Facial abnormal movement Referring Physician: Jeraldine Loots, R  CC: Facial droop  History is obtained from: Patient  HPI: Shelley Mccoy is a 48 y.o. 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  At her initial presentation, she was treated with IV TPA, but over time it became clear that her symptoms were psychogenic.  Today she was at work when she began having similar symptoms which she calls her "Bell's palsy."  She states that they typically get better over the course of a few hours.  She denies numbness or weakness of her arms or legs.   LKW: Noon tpa given?: no, not a stroke    ROS: A 14 point ROS was performed and is negative except as noted in the HPI.   Past Medical History:  Diagnosis Date  . Anemia   . Asthma   . Bell's palsy   . Diabetes mellitus without complication (HCC)   . Heart murmur   . History of uterine fibroid   . HSV-2 (herpes simplex virus 2) infection   . Hypertension   . Seasonal allergies      Family History  Problem Relation Age of Onset  . Diabetes Mother   . Congestive Heart Failure Mother   . Kidney failure Mother   . Hypertension Mother   . Prostate cancer Father   . Hypertension Sister   . Diabetes Maternal Grandmother   . Congestive Heart Failure Maternal Grandmother   . Kidney failure Maternal Grandmother   . Alzheimer's disease Paternal Grandmother      Social History:  reports that she has never smoked. She has never used smokeless tobacco. She reports that she does not drink alcohol or use drugs.   Exam: Current vital signs: BP 127/84 (BP Location: Right Arm)   Pulse 75   Temp 97.7 F (36.5 C) (Oral)   Resp 18   SpO2 100%  Vital signs in last 24 hours: Temp:  [97.7 F (36.5 C)] 97.7 F (36.5 C) (03/22 1334) Pulse Rate:  [75] 75 (03/22 1334) Resp:  [18] 18 (03/22 1334) BP: (127)/(84) 127/84  (03/22 1334) SpO2:  [100 %] 100 % (03/22 1334)   Physical Exam  Constitutional: Appears well-developed and well-nourished.  Psych: Affect appropriate to situation Eyes: No scleral injection HENT: No OP obstrucion Head: Normocephalic.  Cardiovascular: Normal rate and regular rhythm.  Respiratory: Effort normal, non-labored breathing GI: Soft.  No distension. There is no tenderness.  Skin: WDI  Neuro: Mental Status: Patient is awake, alert, oriented to person, place, month, year, and situation. Patient is able to give a clear and coherent history. No signs of aphasia or neglect At times she variably has stutter, fluent speech, or trouble speaking. Cranial Nerves: II: Visual Fields are full. Pupils are equal, round, and reactive to light.   III,IV, VI: EOMI without ptosis or diploplia.  V: Facial sensation is symmetric to temperature VII: She holds her face in an abnormal twisted type posture that is not consistent with organic disease VIII: hearing is intact to voice X: Uvula elevates symmetrically XI: Shoulder shrug is symmetric. XII: tongue is midline without atrophy or fasciculations.  Motor: Tone is normal. Bulk is normal. 5/5 strength was present in all four extremities.  She has a distractible tremor of her right upper extremity Sensory: Sensation is symmetric to light touch and temperature in the arms and legs. 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  I discussed the nature of her deficits with the patient who expressed understanding.  Recommendations: 1) I advised the patient to seek psychological therapy to try to look for underlying stressors that may be contributing to repeated conversion disorder 2) no further neurological testing  needed.   Ritta Slot, MD [facility] [phone]  If 7pm- 7am, please page neurology on call as listed in AMION.

## 2019-03-12 IMAGING — CT CT HEAD CODE STROKE
3 series · 15 of 47 positions shown, 18 images · non-contrast
Comparison: 11/07/2017

CLINICAL DATA: Code stroke.  Aphasia and right-sided droop

EXAM:
CT HEAD WITHOUT CONTRAST
TECHNIQUE: Contiguous axial images were obtained from the base of the skull
through the vertex without intravenous contrast.

[Series 3: head 5.0 st · axial · 0.43mm/px · z∈[-60,+85]mm · 9 of 35 slices shown, 12 images]
[im 3/35  brain]
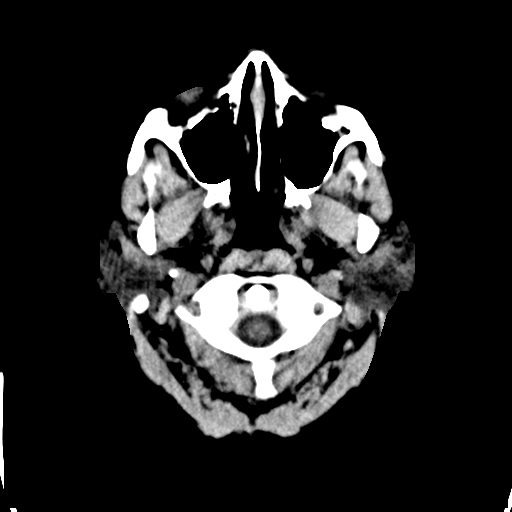
[im 3/35  bone]
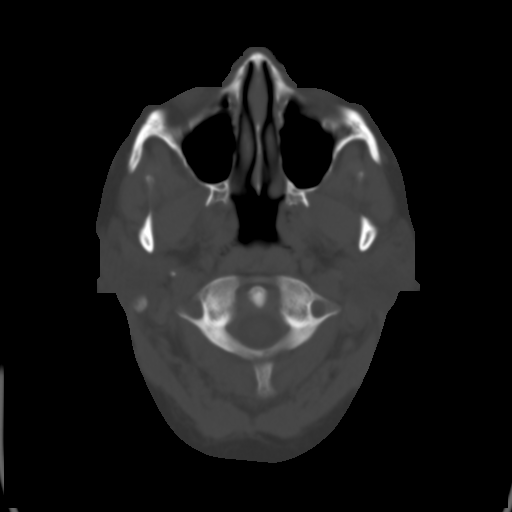
[im 6/35  brain]
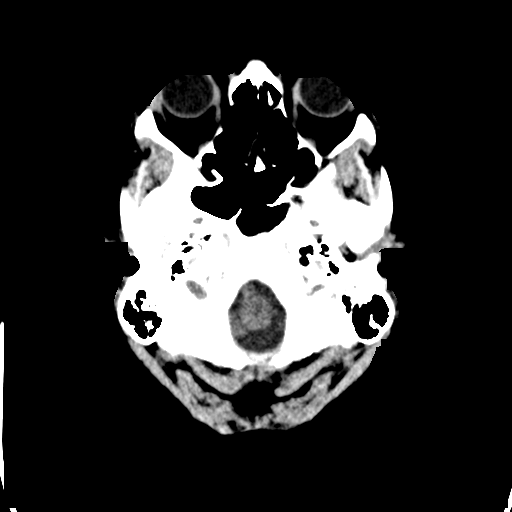
[im 10/35  brain]
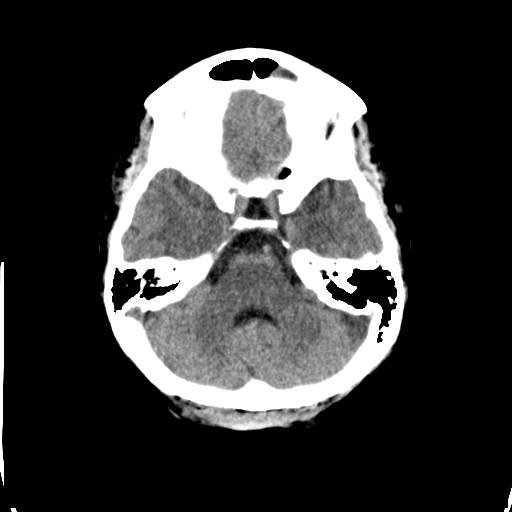
[im 13/35  brain]
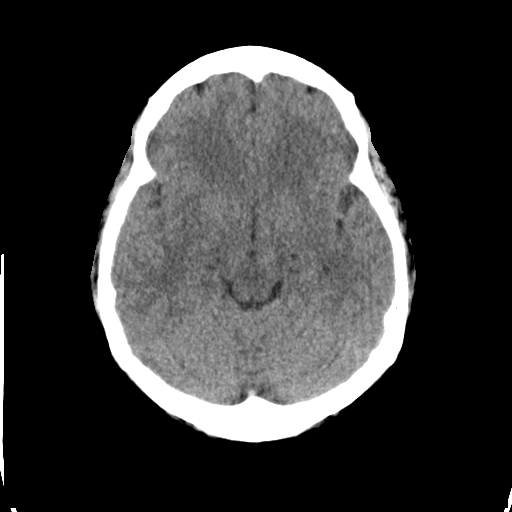
[im 18/35  brain]
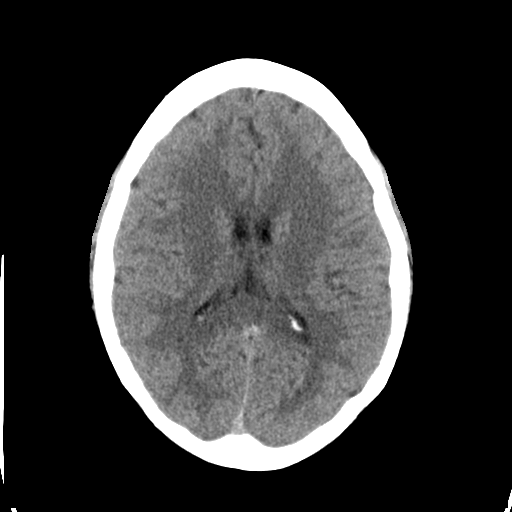
[im 18/35  bone]
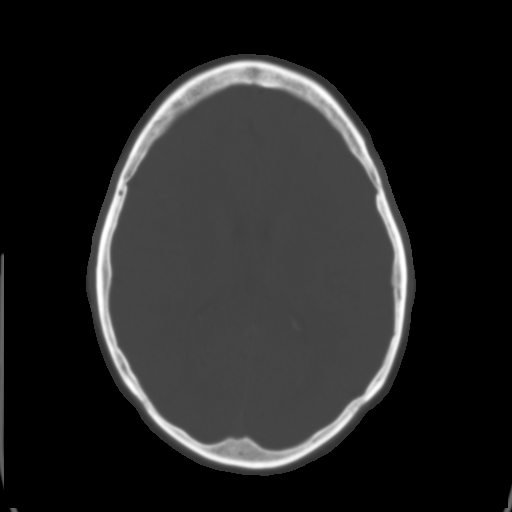
[im 22/35  brain]
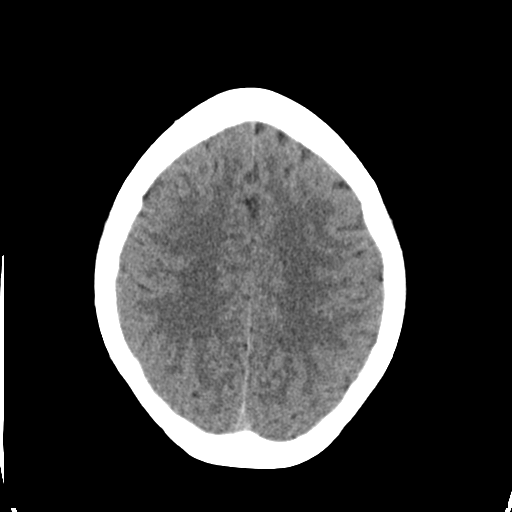
[im 25/35  brain]
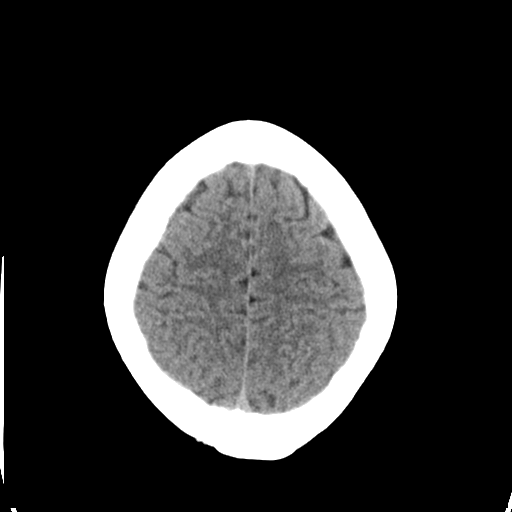
[im 29/35  brain]
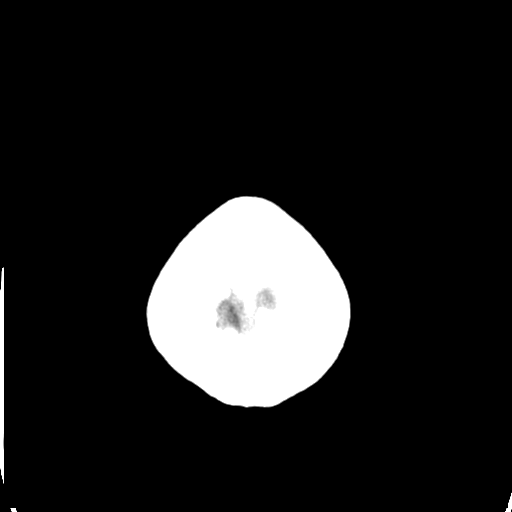
[im 32/35  brain]
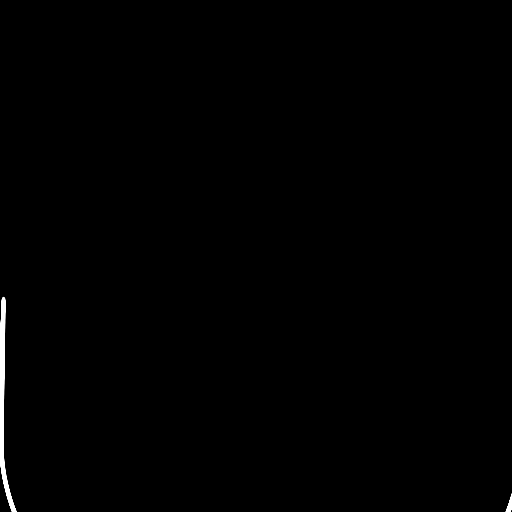
[im 32/35  bone]
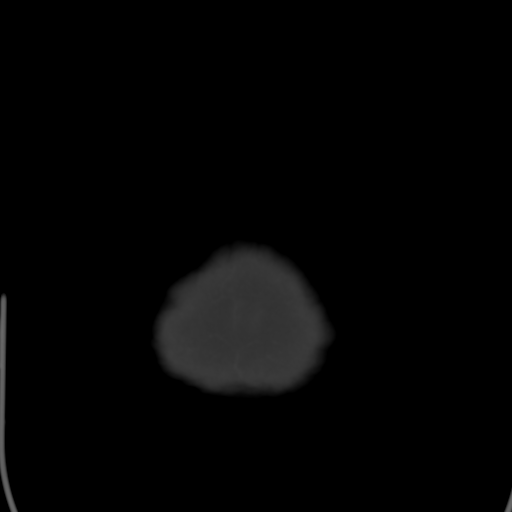

[Series 5: head 3.0 cor st · coronal · 0.34mm/px · 3 of 69 slices shown]
[im 23/69  brain]
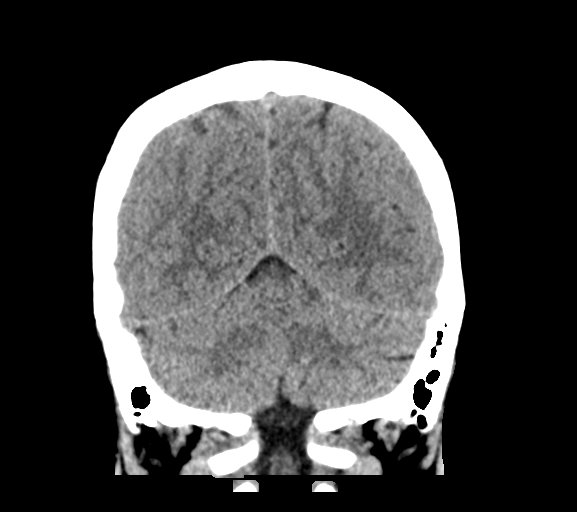
[im 31/69  brain]
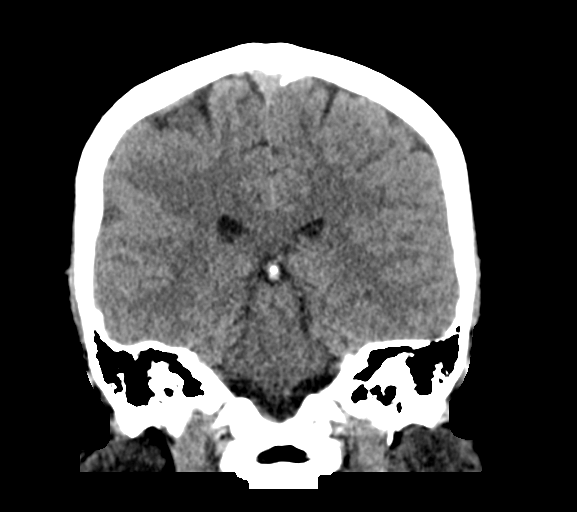
[im 38/69  brain]
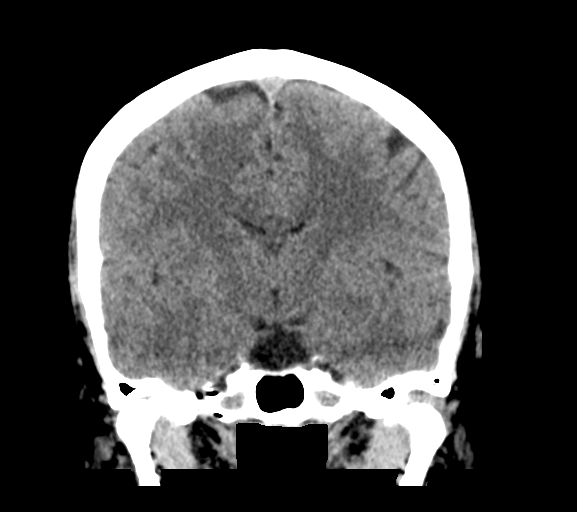

[Series 6: head 3.0 sag st · sagittal · 0.34mm/px · 3 of 58 slices shown]
[im 20/58  brain]
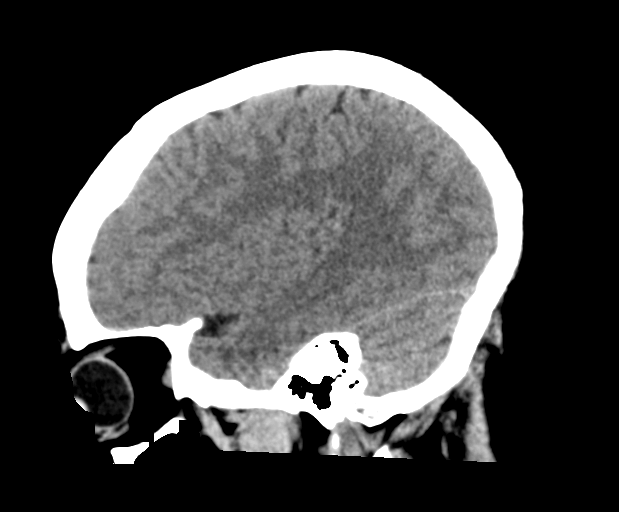
[im 29/58  brain]
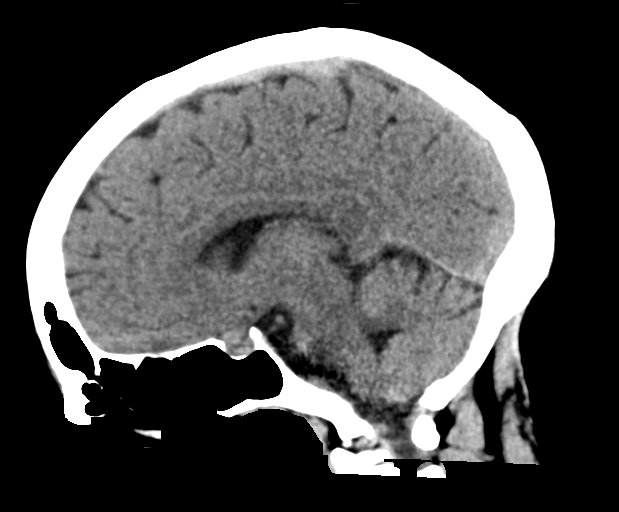
[im 39/58  brain]
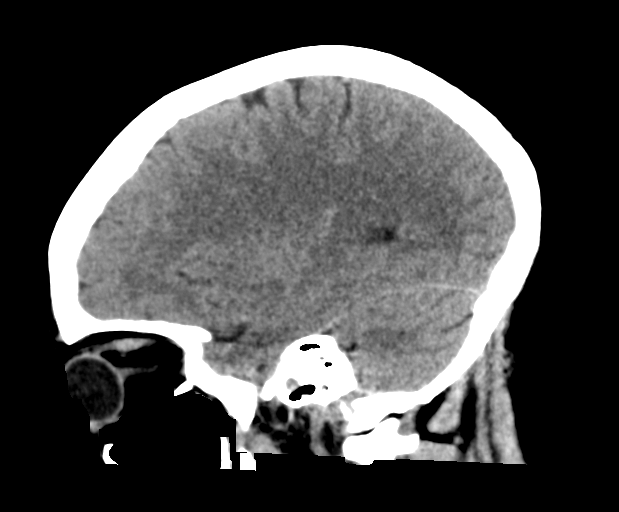

[15 of 47 positions shown; findings below may reference images not displayed]

FINDINGS: Brain: No evidence of acute infarction, hemorrhage, hydrocephalus,
extra-axial collection or mass lesion/mass effect.

Vascular: No hyperdense vessel or unexpected calcification.

Skull: Normal. Negative for fracture or focal lesion.

Sinuses/Orbits: Partial opacification of the left frontal sinus.

Other: These results were communicated to Dr. Liggins at [DATE] pmon
01/21/2018by text page via the AMION messaging system.

ASPECTS (Alberta Stroke Program Early CT Score)

- Ganglionic level infarction (caudate, lentiform nuclei, internal
capsule, insula, M1-M3 cortex): 7

- Supraganglionic infarction (M4-M6 cortex): 3

Total score (0-10 with 10 being normal): 10
IMPRESSION: Negative head CT.  ASPECTS is 10.

## 2019-03-22 ENCOUNTER — Ambulatory Visit (INDEPENDENT_AMBULATORY_CARE_PROVIDER_SITE_OTHER): Payer: Commercial Managed Care - PPO | Admitting: Allergy

## 2019-03-22 ENCOUNTER — Encounter: Payer: Self-pay | Admitting: Allergy

## 2019-03-22 ENCOUNTER — Other Ambulatory Visit: Payer: Self-pay

## 2019-03-22 VITALS — BP 118/64 | HR 70 | Temp 97.6°F | Resp 16 | Ht 61.5 in | Wt 213.4 lb

## 2019-03-22 DIAGNOSIS — L501 Idiopathic urticaria: Secondary | ICD-10-CM | POA: Insufficient documentation

## 2019-03-22 DIAGNOSIS — J3089 Other allergic rhinitis: Secondary | ICD-10-CM

## 2019-03-22 DIAGNOSIS — L508 Other urticaria: Secondary | ICD-10-CM

## 2019-03-22 DIAGNOSIS — T783XXD Angioneurotic edema, subsequent encounter: Secondary | ICD-10-CM | POA: Diagnosis not present

## 2019-03-22 DIAGNOSIS — H1013 Acute atopic conjunctivitis, bilateral: Secondary | ICD-10-CM

## 2019-03-22 DIAGNOSIS — T783XXA Angioneurotic edema, initial encounter: Secondary | ICD-10-CM

## 2019-03-22 DIAGNOSIS — J4540 Moderate persistent asthma, uncomplicated: Secondary | ICD-10-CM

## 2019-03-22 HISTORY — DX: Angioneurotic edema, initial encounter: T78.3XXA

## 2019-03-22 HISTORY — DX: Other urticaria: L50.8

## 2019-03-22 MED ORDER — CETIRIZINE HCL 10 MG PO TABS: 10.0000 mg | ORAL_TABLET | Freq: Two times a day (BID) | ORAL | 3 refills | Status: DC

## 2019-03-22 NOTE — Progress Notes (Signed)
New Patient Note  RE: Shelley Mccoy MRN: [id number] DOB: 20-Jul-1971 Date of Office Visit: 03/22/2019  Referring provider: Corinna Lines, * Primary care provider: Dorena Dew, FNP  Chief Complaint: Establish Care (hives started this month has pictures)  History of Present Illness: I had the pleasure of seeing Shelley Mccoy for initial evaluation at the [facility] on 03/22/2019. She is a 48 y.o. female, who is referred here by Dorena Dew, FNP for the evaluation of hives.   Rash/hives: Rash started about 3 years ago. The rash/hives can occur anywhere on her body.  Describes them as raised, pruritic, erythematous. Individual rashes lasts about a few hours. Patient can't tell me how often she has an outbreak but last episode was last night since she stopped her antihistamines. She also had 1 episode of lip angioedema on June 8th which required ER visit and oral prednisone.   No ecchymosis upon resolution. Associated symptoms include: throat tightness but no respiratory distress. Suspected triggers are unknown. Denies any fevers, chills, changes in medications, foods, personal care products or recent infections. She has tried the following therapies: fexofenadine, benadryl with some benefit. Systemic steroids yes for the lip angioedema. Currently on fexofenadine '180mg'$  daily.  Previous work up includes: had some skin testing done about 3-4 years ago in [place] but not sure of results. Previous history of rash/hives: no. Patient is up to date with the following cancer screening tests: mammogram, pap smears.  Assessment and Plan: [name] is a 48 y.o. female with: Chronic urticaria Breaking out in rash/hives for the past 3 years.  Not sure how often this is happening but last episode was last night since off antihistamine.  On June 8th she had one episode of lip angioedema and went to the ER. Treated with oral prednisone with good benefit.  Patient Not  sure what may be triggering these episodes.  Today's skin testing showed: Positive to ragweed, dust mites, molds and cockroaches. Negative to common foods.  Discussed with patient that these may not be triggering her rashes/hives.  Get bloodwork as below to rule out any other underlying causes.   Start environmental control measures.  Take pictures when it happens again.  Start zyrtec '10mg'$  once a day and increase to twice a day if you have breakthrough rash/hives. If it makes you tired let us know. This replaces the fexofenadine. Avoid the following potential triggers: alcohol, tight clothing, NSAIDs.   Angioedema of lips  See assessment and plan as above for urticaria.  For mild symptoms you can take over the counter antihistamines such as Benadryl and monitor symptoms closely. If symptoms worsen or if you have severe symptoms including breathing issues, throat closure, significant swelling, whole body hives, severe diarrhea and vomiting, lightheadedness then seek immediate medical care.  Other allergic rhinitis Rhinoconjunctivitis symptoms for the past 5 to 10 years mainly during the pollen seasons.  Using Allegra and Atrovent nasal spray with some benefit.  Today's skin testing showed: Positive to ragweed and dust mites, molds and cockroaches  Start environmental control measures.  Continue Atrovent nasal spray 2 sprays twice a day for sinus symptoms.   Take zyrtec '10mg'$  daily 1-2 times a day as above.  Allergic conjunctivitis of both eyes  See assessment and plan as above for allergic rhinitis.  Asthma Diagnosed with asthma 4 years ago.  Currently on Advair 115 2 puffs twice a day and albuterol as needed with good benefit.  Mainly using albuterol during pollen season.  Today's spirometry showed: possible restrictive disease and 21% improvement in FEV1 post bronchodilator treatment.  There is a disconnect between subjective symptoms and objective findings on the spirometry.  . Daily controller medication(s): continue Advair 110 2 puffs twice a day with spacer and rinse mouth afterwards. Spacer given and demonstrated proper use.  . Prior to physical activity: May use albuterol rescue inhaler 2 puffs 5 to 15 minutes prior to strenuous physical activities. Marland Kitchen Rescue medications: May use albuterol rescue inhaler 2 puffs or nebulizer every 4 to 6 hours as needed for shortness of breath, chest tightness, coughing, and wheezing. Monitor frequency of use.   Repeat spirometry at next visit.  Return in about 2 months (around 05/22/2019).  Meds ordered this encounter  Medications  . cetirizine (ZYRTEC ALLERGY) 10 MG tablet    Sig: Take 1 tablet (10 mg total) by mouth 2 (two) times daily.    Dispense:  60 tablet    Refill:  3    Lab Orders     CBC with Differential/Platelet     Comprehensive metabolic panel     Sed Rate (ESR)     C-reactive protein     Thyroid Cascade Profile     ANA w/Reflex     C3 and C4     Alpha-Gal Panel     C1 esterase inhibitor, functional     C1 Esterase Inhibitor     Complement component c1q  Other allergy screening: Asthma: yes  She reports symptoms of chest tightness, shortness of breath, coughing for 40 years. Current medications include Advair 115 2 puffs BID and albuterol prn which help. She reports not using aerochamber with asthma inhalers. She tried the following inhalers: no. Main asthma triggers are allergies, infections. In the last month, frequency of asthma symptoms: depending on pollen season. Frequency of nocturnal symptoms: 0x/month. Frequency of SABA use: depending. Interference with physical activity: no. Sleep is undisturbed. In the last 12 months, emergency room visits/urgent care visits/doctor office visits or hospitalizations due to asthma: 0. In the last 12 months, oral steroids courses: 0. Lifetime history of hospitalization for asthma: 0. Prior intubations: 0. History of pneumonia: 0. She was evaluated by allergist in  the past. Smoking exposure: no. Up to date with flu vaccine: yes.  History of reflux: yes.  Rhino conjunctivitis: yes  She reports symptoms of itchy eyes, sneezing, nasal congestion, rhinorrhea. Symptoms have been going on for 5-10 years. The symptoms are present during the pollen seasons. Anosmia: diminished. Headache: yes. She has used Human resources officer, Atrovent nasal sprays with some improvement in symptoms. Sinus infections: yes. Previous work up includes: not sure of results. Previous ENT evaluation: no. Food allergy: no Medication allergy: yes  Percocet - hives Hymenoptera allergy: no Urticaria: yes Eczema:no History of recurrent infections suggestive of immunodeficency: no  Diagnostics: Spirometry:  Tracings reviewed. Her effort: Good reproducible efforts. FVC: 1.59L FEV1: 1.14L, 55% predicted FEV1/FVC ratio: 72% Interpretation: Spirometry consistent with possible restrictive disease and 21% improvement in FEV1 post bronchodilator treatment.  Please see scanned spirometry results for details.  Skin Testing: Environmental allergy panel and select foods. Positive test to: ragweed, dust mites, molds, cockroaches. Negative test to: common foods.  Results discussed with patient/family. Airborne Adult Perc - 03/22/19 1436    Time Antigen Placed  0222    Allergen Manufacturer  Lavella Hammock    Location  Back    Number of Test  59    Panel 1  Select    1. Control-Buffer 50% Glycerol  Negative    2. Control-Histamine 1 mg/ml  2+    3. Albumin saline  Negative    4. [place]  Negative    5. [place]  Negative    6. [place]  Negative    7. [place] Blue  Negative    8. Meadow Fescue  Negative    9. Perennial Rye  Negative    10. Sweet Vernal  Negative    11. Timothy  Negative    12. Cocklebur  Negative    13. Burweed Mars[place]er  Negative    14. Ragweed, short  4+    15. Ragweed, Giant  3+    16. Plantain,  English  Negative    17. Lamb's Quarters  Negative    18. Sheep Sorrell  Negative     19. Rough Pigweed  Negative    20. Marsh Elder, Rough  Negative    21. Mugwort, Common  Negative    22. Ash mix  Negative    23. Birch mix  Negative    24. Beech American  Negative    25. Box, Elder  Negative    26. Cedar, red  Negative    27. Cottonwood, [place]  Negative    28. Elm mix  Negative    29. Hickory mix  Negative    30. Maple mix  Negative    31. Oak, [place] mix  Negative    32. Pecan Pollen  Negative    33. Pine mix  Negative    34. Sycamore Eastern  Negative    35. [place], Black Pollen  Negative    36. Alternaria alternata  Negative    37. Cladosporium Herbarum  Negative    38. Aspergillus mix  Negative    39. Penicillium mix  Negative    40. Bipolaris sorokiniana (Helminthosporium)  Negative    41. Drechslera spicifera (Curvularia)  Negative    42. Mucor plumbeus  Negative    43. Fusarium moniliforme  Negative    44. Aureobasidium pullulans (pullulara)  Negative    45. Rhizopus oryzae  Negative    46. Botrytis cinera  Negative    47. Epicoccum nigrum  Negative    48. Phoma betae  Negative    49. Candida Albicans  Negative    50. Trichophyton mentagrophytes  Negative    51. Mite, D Farinae  5,000 AU/ml  4+    52. Mite, D Pteronyssinus  5,000 AU/ml  4+    53. Cat Hair 10,000 BAU/ml  Negative    54.  Dog Epithelia  Negative    55. Mixed Feathers  Negative    56. Horse Epithelia  Negative    57. Cockroach, German  Negative    58. Mouse  Negative    59. Tobacco Leaf  Negative     Intradermal - 03/22/19 1530    Time Antigen Placed  1530    Allergen Manufacturer  Lavella Hammock    Location  Arm    Number of Test  13    Control  Negative    [place]  Negative    Johnson  Negative    7 Grass  Negative    Weed mix  Negative    Tree mix  Negative    Mold 1  2+    Mold 2  2+    Mold 3  Negative    Mold 4  2+    Cat  Negative    Dog  Negative    Cockroach  3+     Food Adult Perc - 03/22/19 1500    Time Antigen Placed  1534    Allergen Manufacturer  Lavella Hammock     Location  Back    Number of allergen test  10    Control-Histamine 1 mg/ml  2+    1. Peanut  Negative    2. Soybean  Negative    3. Wheat  Negative    4. Sesame  Negative    5. Milk, cow  Negative    6. Egg White, Chicken  Negative    7. Casein  Negative    8. Shellfish Mix  Negative    9. Fish Mix  Negative    10. Cashew  Negative       Past Medical History: Patient Active Problem List   Diagnosis Date Noted  . Chronic urticaria 03/22/2019  . Angioedema of lips 03/22/2019  . Other allergic rhinitis 03/22/2019  . Allergic conjunctivitis of both eyes 03/22/2019  . Morbid obesity with BMI of 40.0-44.9, adult ([place]) 12/01/2017  . Type 2 diabetes mellitus without complication, without long-term current use of insulin ([place]) 12/01/2017  . Essential hypertension 12/01/2017  . Hyperlipidemia LDL goal <100 12/01/2017  . 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 Asthma 01/12/2013  . GERD (gastroesophageal reflux disease) 01/12/2013  . Antral gastritis 07/01/2012   Past Medical History:  Diagnosis Date  . Anemia   . Angioedema of lips 03/22/2019  . Asthma   . Chronic urticaria 03/22/2019  . Diabetes mellitus without complication ([place])   . Heart murmur   . History of uterine fibroid   . HSV-2 (herpes simplex virus 2) infection   . Hypertension   . Seasonal allergies    Past Surgical History: Past Surgical History:  Procedure Laterality Date  . CYSTECTOMY     hand and breast  . LAPAROSCOPIC ABDOMINAL EXPLORATION    . TOTAL ABDOMINAL HYSTERECTOMY     Medication List:  Current Outpatient Medications  Medication Sig Dispense Refill  . albuterol (VENTOLIN HFA) 108 (90 Base) MCG/ACT inhaler Inhale into the lungs every 6 (six) hours as needed for wheezing or shortness of breath.     . Ascorbic Acid (VITAMIN C PO) Take 1 tablet by mouth daily.    . Ferrous Sulfate (IRON PO) Take 1 tablet by mouth daily.    . fluticasone-salmeterol (ADVAIR HFA) 115-21 MCG/ACT inhaler Inhale 2 puffs  into the lungs 2 (two) times daily as needed (for flares).     . gabapentin (NEURONTIN) 300 MG capsule Take 300 mg by mouth daily.     . hydrochlorothiazide (HYDRODIURIL) 12.5 MG tablet Take 12.5 mg by mouth daily.    . insulin glargine (LANTUS) 100 UNIT/ML injection Inject 18 Units into the skin at bedtime.     Marland Kitchen ipratropium (ATROVENT) 0.03 % nasal spray Place 2 sprays into both nostrils 2 (two) times daily.    Marland Kitchen losartan (COZAAR) 50 MG tablet Take 50 mg by mouth daily.    . pravastatin (PRAVACHOL) 20 MG tablet Take 20 mg by mouth at bedtime.     . sertraline (ZOLOFT) 50 MG tablet Take 50 mg by mouth at bedtime.     . valACYclovir (VALTREX) 1000 MG tablet Take 1 tablet (1,000 mg total) by mouth 3 (three) times daily. 21 tablet 0  . VITAMIN D PO Take 1 tablet by mouth daily.    . cetirizine (ZYRTEC ALLERGY) 10 MG tablet Take 1 tablet (10 mg total) by mouth 2 (two) times  daily. 60 tablet 3   No current facility-administered medications for this visit.    Allergies: Allergies  Allergen Reactions  . Percocet [Oxycodone-Acetaminophen] Hives   Social History: Social History   Socioeconomic History  . Marital status: Single    Spouse name: Not on file  . Number of children: Not on file  . Years of education: Not on file  . Highest education level: Not on file  Occupational History  . Not on file  Social Needs  . Financial resource strain: Not on file  . Food insecurity    Worry: Not on file    Inability: Not on file  . Transportation needs    Medical: Not on file    Non-medical: Not on file  Tobacco Use  . Smoking status: Never Smoker  . Smokeless tobacco: Never Used  Substance and Sexual Activity  . Alcohol use: No  . Drug use: No  . Sexual activity: Not on file  Lifestyle  . Physical activity    Days per week: Not on file    Minutes per session: Not on file  . Stress: Not on file  Relationships  . Social Herbalist on phone: Not on file    Gets together: Not  on file    Attends religious service: Not on file    Active member of club or organization: Not on file    Attends meetings of clubs or organizations: Not on file    Relationship status: Not on file  Other Topics Concern  . Not on file  Social History Narrative  . Not on file   Lives in a new home. Smoking: denies Occupation: Proofreader HistoryFreight forwarder in the house: no Charity fundraiser in the family room: no Carpet in the bedroom: yes Heating: electric Cooling: central Pet: no  Family History: Family History  Problem Relation Age of Onset  . Diabetes Mother   . Congestive Heart Failure Mother   . Kidney failure Mother   . Hypertension Mother   . Asthma Mother   . Prostate cancer Father   . Hypertension Sister   . Diabetes Maternal Grandmother   . Congestive Heart Failure Maternal Grandmother   . Kidney failure Maternal Grandmother   . Alzheimer's disease Paternal Grandmother    Review of Systems  Constitutional: Negative for appetite change, chills, fever and unexpected weight change.  HENT: Positive for congestion, rhinorrhea and sneezing.   Eyes: Positive for itching.  Respiratory: Negative for cough, chest tightness, shortness of breath and wheezing.   Cardiovascular: Negative for chest pain.  Gastrointestinal: Negative for abdominal pain.  Genitourinary: Negative for difficulty urinating.  Skin: Positive for rash.  Allergic/Immunologic: Positive for environmental allergies. Negative for food allergies.  Neurological: Negative for headaches.   Objective: BP 118/64 (BP Location: Right Arm, Patient Position: Sitting)   Pulse 70   Temp 97.6 F (36.4 C)   Resp 16   Ht 5' 1.5" (1.562 m)   Wt 213 lb 6.4 oz (96.8 kg)   SpO2 99%   BMI 39.67 kg/m  Body mass index is 39.67 kg/m. Physical Exam  Constitutional: She is oriented to person, place, and time. She appears well-developed and well-nourished.  HENT:  Head: Normocephalic and atraumatic.   Right Ear: External ear normal.  Left Ear: External ear normal.  Nose: Nose normal.  Mouth/Throat: Oropharynx is clear and moist.  Eyes: Conjunctivae and EOM are normal.  Neck: Neck supple.  Cardiovascular: Normal rate and regular rhythm.  Exam reveals no gallop and no friction rub.  Murmur heard. Pulmonary/Chest: Effort normal and breath sounds normal. She has no wheezes. She has no rales.  Abdominal: Soft.  Neurological: She is alert and oriented to person, place, and time.  Skin: Skin is warm. No rash noted.  Psychiatric: She has a normal mood and affect. Her behavior is normal.  Nursing note and vitals reviewed.  The plan was reviewed with the patient/family, and all questions/concerned were addressed.  It was my pleasure to see Shelley Mccoy today and participate in her care. Please feel free to contact me with any questions or concerns.  Sincerely,  Rexene Alberts, DO Allergy & Immunology  [facility] office: [phone] [facility] office: [phone]

## 2019-03-22 NOTE — Patient Instructions (Addendum)
Today's skin testing showed: Positive to ragweed and dust mites, molds and cockroaches   Start environmental control measures.  Continue Atrovent nasal spray 2 sprays twice a day for sinus symptoms.   Rash/hives:  Take pictures when it happens again.  Start zyrtec '10mg'$  once a day and increase to twice a day if you have breakthrough rash/hives. If it makes you tired let us know. This replaces the fexofenadine.  Get bloodwork - CBC diff, CMP, ESR, CRP, Thyroid cascade, ANA w/reflex, C3, C4, alpha gal, C1 esterase inhibitor & function, C1Q Avoid the following potential triggers: alcohol, tight clothing, NSAIDs.   Asthma: . Daily controller medication(s): continue Advair 110 2 puffs twice a day with spacer and rinse mouth afterwards. . Prior to physical activity: May use albuterol rescue inhaler 2 puffs 5 to 15 minutes prior to strenuous physical activities. Marland Kitchen Rescue medications: May use albuterol rescue inhaler 2 puffs or nebulizer every 4 to 6 hours as needed for shortness of breath, chest tightness, coughing, and wheezing. Monitor frequency of use.  . Asthma control goals:  o Full participation in all desired activities (may need albuterol before activity) o Albuterol use two times or less a week on average (not counting use with activity) o Cough interfering with sleep two times or less a month o Oral steroids no more than once a year o No hospitalizations  Follow up in 2 months  Reducing Pollen Exposure . Pollen seasons: trees (spring), grass (summer) and ragweed/weeds (fall). Marland Kitchen Keep windows closed in your home and car to lower pollen exposure.  Susa Simmonds air conditioning in the bedroom and throughout the house if possible.  . Avoid going out in dry windy days - especially early morning. . Pollen counts are highest between 5 - 10 AM and on dry, hot and windy days.  . Save outside activities for late afternoon or after a heavy rain, when pollen levels are lower.  . Avoid mowing  of grass if you have grass pollen allergy. Marland Kitchen Be aware that pollen can also be transported indoors on people and pets.  . Dry your clothes in an automatic dryer rather than hanging them outside where they might collect pollen.  . Rinse hair and eyes before bedtime. Control of House Dust Mite Allergen . Dust mite allergens are a common trigger of allergy and asthma symptoms. While they can be found throughout the house, these microscopic creatures thrive in warm, humid environments such as bedding, upholstered furniture and carpeting. . Because so much time is spent in the bedroom, it is essential to reduce mite levels there.  . Encase pillows, mattresses, and box springs in special allergen-proof fabric covers or airtight, zippered plastic covers.  . Bedding should be washed weekly in hot water (130 F) and dried in a hot dryer. Allergen-proof covers are available for comforters and pillows that can't be regularly washed.  Wendee Copp the allergy-proof covers every few months. Minimize clutter in the bedroom. Keep pets out of the bedroom.  Marland Kitchen Keep humidity less than 50% by using a dehumidifier or air conditioning. You can buy a humidity measuring device called a hygrometer to monitor this.  . If possible, replace carpets with hardwood, linoleum, or washable area rugs. If that's not possible, vacuum frequently with a vacuum that has a HEPA filter. . Remove all upholstered furniture and non-washable window drapes from the bedroom. . Remove all non-washable stuffed toys from the bedroom.  Wash stuffed toys weekly.  Mold Control . Mold and fungi  can grow on a variety of surfaces provided certain temperature and moisture conditions exist.  . Outdoor molds grow on plants, decaying vegetation and soil. The major outdoor mold, Alternaria and Cladosporium, are found in very high numbers during hot and dry conditions. Generally, a late summer - fall peak is seen for common outdoor fungal spores. Rain will  temporarily lower outdoor mold spore count, but counts rise rapidly when the rainy period ends. . The most important indoor molds are Aspergillus and Penicillium. Dark, humid and poorly ventilated basements are ideal sites for mold growth. The next most common sites of mold growth are the bathroom and the kitchen. Outdoor (Seasonal) Mold Control . Use air conditioning and keep windows closed. . Avoid exposure to decaying vegetation. Marland Kitchen Avoid leaf raking. . Avoid grain handling. . Consider wearing a face mask if working in moldy areas.  Indoor (Perennial) Mold Control  . Maintain humidity below 50%. . Get rid of mold growth on hard surfaces with water, detergent and, if necessary, 5% bleach (do not mix with other cleaners). Then dry the area completely. If mold covers an area more than 10 square feet, consider hiring an indoor environmental professional. . For clothing, washing with soap and water is best. If moldy items cannot be cleaned and dried, throw them away. . Remove sources e.g. contaminated carpets. . Repair and seal leaking roofs or pipes. Using dehumidifiers in damp basements may be helpful, but empty the water and clean units regularly to prevent mildew from forming. All rooms, especially basements, bathrooms and kitchens, require ventilation and cleaning to deter mold and mildew growth. Avoid carpeting on concrete or damp floors, and storing items in damp areas. Cockroach Allergen Avoidance Cockroaches are often found in the homes of densely populated urban areas, schools or commercial buildings, but these creatures can lurk almost anywhere. This does not mean that you have a dirty house or living area. . Block all areas where roaches can enter the home. This includes crevices, wall cracks and windows.  . Cockroaches need water to survive, so fix and seal all leaky faucets and pipes. Have an exterminator go through the house when your family and pets are gone to eliminate any remaining  roaches. Marland Kitchen Keep food in lidded containers and put pet food dishes away after your pets are done eating. Vacuum and sweep the floor after meals, and take out garbage and recyclables. Use lidded garbage containers in the kitchen. Wash dishes immediately after use and clean under stoves, refrigerators or toasters where crumbs can accumulate. Wipe off the stove and other kitchen surfaces and cupboards regularly.

## 2019-03-22 NOTE — Assessment & Plan Note (Addendum)
Rhinoconjunctivitis symptoms for the past 5 to 10 years mainly during the pollen seasons.  Using Allegra and Atrovent nasal spray with some benefit.  Today's skin testing showed: Positive to ragweed and dust mites, molds and cockroaches  Start environmental control measures.  Continue Atrovent nasal spray 2 sprays twice a day for sinus symptoms.   Take zyrtec 10mg  daily 1-2 times a day as above.

## 2019-03-22 NOTE — Assessment & Plan Note (Signed)
Breaking out in rash/hives for the past 3 years.  Not sure how often this is happening but last episode was last night since off antihistamine.  On June 8th she had one episode of lip angioedema and went to the ER. Treated with oral prednisone with good benefit.  Patient Not sure what may be triggering these episodes.  Today's skin testing showed: Positive to ragweed, dust mites, molds and cockroaches. Negative to common foods.  Discussed with patient that these may not be triggering her rashes/hives.  Get bloodwork as below to rule out any other underlying causes.   Start environmental control measures.  Take pictures when it happens again.  Start zyrtec 10mg  once a day and increase to twice a day if you have breakthrough rash/hives. If it makes you tired let us know. This replaces the fexofenadine. Avoid the following potential triggers: alcohol, tight clothing, NSAIDs.

## 2019-03-22 NOTE — Assessment & Plan Note (Signed)
   See assessment and plan as above for urticaria.  For mild symptoms you can take over the counter antihistamines such as Benadryl and monitor symptoms closely. If symptoms worsen or if you have severe symptoms including breathing issues, throat closure, significant swelling, whole body hives, severe diarrhea and vomiting, lightheadedness then seek immediate medical care.

## 2019-03-22 NOTE — Assessment & Plan Note (Signed)
Diagnosed with asthma 4 years ago.  Currently on Advair 115 2 puffs twice a day and albuterol as needed with good benefit.  Mainly using albuterol during pollen season.  Today's spirometry showed: possible restrictive disease and 21% improvement in FEV1 post bronchodilator treatment.  There is a disconnect between subjective symptoms and objective findings on the spirometry. . Daily controller medication(s): continue Advair 110 2 puffs twice a day with spacer and rinse mouth afterwards. Spacer given and demonstrated proper use.  . Prior to physical activity: May use albuterol rescue inhaler 2 puffs 5 to 15 minutes prior to strenuous physical activities. Marland Kitchen Rescue medications: May use albuterol rescue inhaler 2 puffs or nebulizer every 4 to 6 hours as needed for shortness of breath, chest tightness, coughing, and wheezing. Monitor frequency of use.   Repeat spirometry at next visit.

## 2019-03-22 NOTE — Assessment & Plan Note (Signed)
   See assessment and plan as above for allergic rhinitis.  

## 2019-03-23 ENCOUNTER — Telehealth: Payer: Self-pay

## 2019-03-23 DIAGNOSIS — T783XXD Angioneurotic edema, subsequent encounter: Secondary | ICD-10-CM

## 2019-03-23 DIAGNOSIS — L508 Other urticaria: Secondary | ICD-10-CM

## 2019-03-23 LAB — COMPREHENSIVE METABOLIC PANEL

## 2019-03-23 LAB — C-REACTIVE PROTEIN

## 2019-03-23 LAB — CBC WITH DIFFERENTIAL/PLATELET

## 2019-03-23 LAB — C3 AND C4

## 2019-03-23 LAB — ALPHA-GAL PANEL

## 2019-03-23 LAB — COMPLEMENT COMPONENT C1Q

## 2019-03-23 LAB — THYROID CASCADE PROFILE

## 2019-03-23 LAB — C1 ESTERASE INHIBITOR

## 2019-03-23 LAB — ANA W/REFLEX

## 2019-03-23 LAB — SEDIMENTATION RATE

## 2019-03-23 NOTE — Telephone Encounter (Signed)
[facility] called and stated that they need the orders for all of the labs put in again so that they can be attached to todays date. Spoke with Beth at [facility] and she stated it would be okay to place orders and have Dr go back and sign them. All lab orders pending, and need diagnosis and to be signed. Thank you

## 2019-04-03 LAB — CBC WITH DIFFERENTIAL/PLATELET
Basophils Absolute: 0 10*3/uL (ref 0.0–0.2)
Basos: 0 %
EOS (ABSOLUTE): 0.1 10*3/uL (ref 0.0–0.4)
Eos: 2 %
Hematocrit: 34.8 % (ref 34.0–46.6)
Hemoglobin: 11.2 g/dL (ref 11.1–15.9)
Immature Grans (Abs): 0 10*3/uL (ref 0.0–0.1)
Immature Granulocytes: 0 %
Lymphocytes Absolute: 2.7 10*3/uL (ref 0.7–3.1)
Lymphs: 47 %
MCH: 25.6 pg — ABNORMAL LOW (ref 26.6–33.0)
MCHC: 32.2 g/dL (ref 31.5–35.7)
MCV: 80 fL (ref 79–97)
Monocytes Absolute: 0.3 10*3/uL (ref 0.1–0.9)
Monocytes: 5 %
Neutrophils Absolute: 2.7 10*3/uL (ref 1.4–7.0)
Neutrophils: 46 %
Platelets: 259 10*3/uL (ref 150–450)
RBC: 4.38 x10E6/uL (ref 3.77–5.28)
RDW: 16 % — ABNORMAL HIGH (ref 11.7–15.4)
WBC: 5.8 10*3/uL (ref 3.4–10.8)

## 2019-04-03 LAB — COMPREHENSIVE METABOLIC PANEL
ALT: 19 IU/L (ref 0–32)
AST: 11 IU/L (ref 0–40)
Albumin/Globulin Ratio: 1.4 (ref 1.2–2.2)
Albumin: 4 g/dL (ref 3.8–4.8)
Alkaline Phosphatase: 108 IU/L (ref 39–117)
BUN/Creatinine Ratio: 17 (ref 9–23)
BUN: 15 mg/dL (ref 6–24)
Bilirubin Total: 0.6 mg/dL (ref 0.0–1.2)
CO2: 24 mmol/L (ref 20–29)
Calcium: 9.2 mg/dL (ref 8.7–10.2)
Chloride: 101 mmol/L (ref 96–106)
Creatinine, Ser: 0.87 mg/dL (ref 0.57–1.00)
GFR calc Af Amer: 90 mL/min/{1.73_m2} (ref >59)
GFR calc non Af Amer: 78 mL/min/{1.73_m2} (ref >59)
Globulin, Total: 2.8 g/dL (ref 1.5–4.5)
Glucose: 104 mg/dL — ABNORMAL HIGH (ref 65–99)
Potassium: 3.5 mmol/L (ref 3.5–5.2)
Sodium: 139 mmol/L (ref 134–144)
Total Protein: 6.8 g/dL (ref 6.0–8.5)

## 2019-04-03 LAB — ALPHA-GAL PANEL
Alpha Gal IgE*: <0.1 kU/L (ref <0.10)
Beef (Bos spp) IgE: <0.1 kU/L (ref <0.35)
Class Interpretation: 0
Class Interpretation: 0
Class Interpretation: 0
Lamb/Mutton (Ovis spp) IgE: <0.1 kU/L (ref <0.35)
Pork (Sus spp) IgE: <0.1 kU/L (ref <0.35)

## 2019-04-03 LAB — ANA W/REFLEX: Anti Nuclear Antibody (ANA): NEGATIVE

## 2019-04-03 LAB — C-REACTIVE PROTEIN: CRP: 9 mg/L (ref 0–10)

## 2019-04-03 LAB — COMPLEMENT COMPONENT C1Q: Complement C1Q: 23 mg/dL — ABNORMAL HIGH (ref 10.3–20.5)

## 2019-04-03 LAB — C1 ESTERASE INHIBITOR: C1INH SerPl-mCnc: 35 mg/dL (ref 21–39)

## 2019-04-03 LAB — THYROID CASCADE PROFILE: TSH: 0.715 u[IU]/mL (ref 0.450–4.500)

## 2019-04-03 LAB — C3 AND C4
Complement C3, Serum: 155 mg/dL (ref 82–167)
Complement C4, Serum: 33 mg/dL (ref 14–44)

## 2019-04-03 LAB — C1 ESTERASE INHIBITOR, FUNCTIONAL: C1INH Functional/C1INH Total MFr SerPl: 100 %mean normal

## 2019-04-03 LAB — SEDIMENTATION RATE: Sed Rate: 24 mm/hr (ref 0–32)

## 2019-04-05 ENCOUNTER — Encounter: Payer: Self-pay | Admitting: Allergy

## 2019-04-17 ENCOUNTER — Telehealth: Payer: Self-pay | Admitting: *Deleted

## 2019-04-17 MED ORDER — PREDNISONE 10 MG PO TABS: 10.0000 mg | ORAL_TABLET | Freq: Every day | ORAL | 0 refills | Status: DC

## 2019-04-17 NOTE — Telephone Encounter (Signed)
Patient called back advised of rx and call Wednesday to follow up with Dr Maudie Mercury. Patient verbalized understanding

## 2019-04-17 NOTE — Telephone Encounter (Signed)
Patient called states she is breaking out in hives x 7 days on neck, face, arms and "under her clothing."  She would like something sent in soon. States she has been [facility] Dr Maudie Mercury since last week about hives but Dr Maudie Mercury is out of the office. Dr Verlin Fester please advise

## 2019-04-17 NOTE — Telephone Encounter (Signed)
Sent in rx for prednisone called patient to inform of this no answer left voicemail to return call. Please advise patient of plan

## 2019-04-17 NOTE — Telephone Encounter (Signed)
Prednisone 10 mg per day x 3 days.  Continue other recommendations from Dr. Maudie Mercury. Follow up with Dr. Maudie Mercury on Wednesday.

## 2019-04-19 ENCOUNTER — Encounter: Payer: Self-pay | Admitting: Allergy

## 2019-04-19 ENCOUNTER — Ambulatory Visit (INDEPENDENT_AMBULATORY_CARE_PROVIDER_SITE_OTHER): Payer: Commercial Managed Care - PPO | Admitting: Allergy

## 2019-04-19 ENCOUNTER — Other Ambulatory Visit: Payer: Self-pay

## 2019-04-19 ENCOUNTER — Telehealth: Payer: Self-pay

## 2019-04-19 VITALS — BP 140/60 | HR 81 | Temp 97.7°F | Resp 16 | Ht 61.5 in

## 2019-04-19 DIAGNOSIS — J4540 Moderate persistent asthma, uncomplicated: Secondary | ICD-10-CM | POA: Diagnosis not present

## 2019-04-19 DIAGNOSIS — J3089 Other allergic rhinitis: Secondary | ICD-10-CM

## 2019-04-19 DIAGNOSIS — J302 Other seasonal allergic rhinitis: Secondary | ICD-10-CM | POA: Diagnosis not present

## 2019-04-19 DIAGNOSIS — H1010 Acute atopic conjunctivitis, unspecified eye: Secondary | ICD-10-CM | POA: Diagnosis not present

## 2019-04-19 DIAGNOSIS — L501 Idiopathic urticaria: Secondary | ICD-10-CM

## 2019-04-19 DIAGNOSIS — L508 Other urticaria: Secondary | ICD-10-CM | POA: Diagnosis not present

## 2019-04-19 MED ORDER — TRIAMCINOLONE ACETONIDE 0.1 % EX OINT: 1.0000 "application " | TOPICAL_OINTMENT | Freq: Two times a day (BID) | CUTANEOUS | 1 refills | Status: DC

## 2019-04-19 MED ORDER — OMALIZUMAB 150 MG ~~LOC~~ SOLR: 150.0000 mg | SUBCUTANEOUS | Status: DC

## 2019-04-19 MED ORDER — FAMOTIDINE 20 MG PO TABS: 20.0000 mg | ORAL_TABLET | Freq: Two times a day (BID) | ORAL | 5 refills | Status: DC

## 2019-04-19 MED ORDER — OMALIZUMAB 150 MG ~~LOC~~ SOLR
150.0000 mg | SUBCUTANEOUS | Status: DC
  Administered 2019-04-19: 300 mg via SUBCUTANEOUS
  Administered 2019-05-17 – 2019-06-20 (×2): 150 mg via SUBCUTANEOUS

## 2019-04-19 MED ORDER — EPINEPHRINE 0.3 MG/0.3ML IJ SOAJ: 0.3000 mg | Freq: Once | INTRAMUSCULAR | 1 refills | Status: AC

## 2019-04-19 MED ORDER — EPINEPHRINE 0.3 MG/0.3ML IJ SOAJ: 0.3000 mg | Freq: Once | INTRAMUSCULAR | 1 refills | Status: DC

## 2019-04-19 NOTE — Progress Notes (Signed)
Follow Up Note  RE: Shelley Mccoy MRN: [id number] DOB: 06-01-1971 Date of Office Visit: 04/19/2019  Referring provider: Dorena Dew, FNP Primary care provider: No primary care provider on file.  Chief Complaint: Urticaria (1 month)  History of Present Illness: I had the pleasure of seeing Shelley Mccoy for a follow up visit at the [facility] on 04/20/2019. She is a 48 y.o. female, who is being followed for urticaria, allergic rhinoconjunctivitis, asthma. Today she is here for new complaint of hives outbreak. Her previous allergy office visit was on 03/22/2019 with Dr. Maudie Mercury.   Chronic urticaria Currently on zyrtec 10mg  twice a day and finished 3 days of prednisone with no benefit. The zyrtec has been making her very drowsy.  Still breaking out on a daily basis throughout her body and the hives are extremely pruritic.  Denies any changes in diet, medications, personal care products.  Other allergic rhinitis Taking Flonase 1 spray once a day as needed with good benefit.  Asthma Currently on Advair 110 2 puffs twice a day and doing well.  Assessment and Plan: May is a 48 y.o. female with: Chronic urticaria Past history - Breaking out in rash/hives for the past 3 years.  Urticaria panel unremarkable.  Interim history - daily hives despite taking zyrtec 10mg  BID and prednisone. Zyrtec causes some drowsiness.   Start Xolair injections 300mg  every 4 weeks. First dose given today in the office with no issues.  I have prescribed epinephrine injectable and demonstrated proper use. For mild symptoms you can take over the counter antihistamines such as Benadryl and monitor symptoms closely. If symptoms worsen or if you have severe symptoms including breathing issues, throat closure, significant swelling, whole body hives, severe diarrhea and vomiting, lightheadedness then inject epinephrine and seek immediate medical care afterwards.  Start prednisone  taper.  Start allegra 180mg  in the morning and zyrtec 20mg  at night.  Start pepcid 20mg  twice a day.   Monitor symptoms.   May use triamcinolone cream twice a day on hives as needed. Do not use on face, groin area or under the armpits. Do not use more than 3 weeks at a time.   Avoid the following potential triggers: alcohol, tight clothing, NSAIDs.   Moderate persistent asthma without complication Past history - Diagnosed with asthma 4 years ago.  2020 spirometry showed: possible restrictive disease and 21% improvement in FEV1 post bronchodilator treatment.   Interim history - stable.   Daily controller medication(s):continue Advair 115 2 puffs twice a day with spacer and rinse mouth afterwards.  Prior to physical activity:May use albuterol rescue inhaler 2 puffs 5 to 15 minutes prior to strenuous physical activities.  Rescue medications:May use albuterol rescue inhaler 2 puffs or nebulizer every 4 to 6 hours as needed for shortness of breath, chest tightness, coughing, and wheezing. Monitor frequency of use.   Repeat spirometry at next visit.  Seasonal and perennial allergic rhinoconjunctivitis Past history - Rhinoconjunctivitis symptoms for the past 5 to 10 years mainly during the pollen seasons.  Using Allegra and Atrovent nasal spray with some benefit. 2020 skin testing showed: Positive to ragweed and dust mites, molds and cockroaches. Interim history - Stable.   Continue environmental control measures.  Continue Flonase 1 spray per nostril 1-2 times a day as needed for nasal congestion.   Return in about 4 weeks (around 05/17/2019).  Meds ordered this encounter  Medications  . DISCONTD: EPINEPHrine (EPIPEN 2-PAK) 0.3 mg/0.3 mL IJ SOAJ injection  Sig: Inject 0.3 mLs (0.3 mg total) into the muscle once for 1 dose.    Dispense:  2 each    Refill:  1  . DISCONTD: omalizumab Geoffry Paradise(XOLAIR) injection 150 mg  . famotidine (PEPCID) 20 MG tablet    Sig: Take 1 tablet (20 mg total)  by mouth 2 (two) times daily.    Dispense:  60 tablet    Refill:  5  . DISCONTD: triamcinolone ointment (KENALOG) 0.1 %    Sig: Apply 1 application topically 2 (two) times daily. As needed for the hives. Do not use on the face, neck, groin area or under armpits. Do not use more than 3 weeks at a time.    Dispense:  453.6 g    Refill:  1  . triamcinolone ointment (KENALOG) 0.1 %    Sig: Apply 1 application topically 2 (two) times daily. As needed for the hives. Do not use on the face, neck, groin area or under armpits.    Dispense:  453.6 g    Refill:  1  . omalizumab (XOLAIR) injection 150 mg  . EPINEPHrine (EPIPEN 2-PAK) 0.3 mg/0.3 mL IJ SOAJ injection    Sig: Inject 0.3 mLs (0.3 mg total) into the muscle once for 1 dose.    Dispense:  2 each    Refill:  1   Diagnostics: None.  Medication List:  Current Outpatient Medications  Medication Sig Dispense Refill  . albuterol (VENTOLIN HFA) 108 (90 Base) MCG/ACT inhaler Inhale into the lungs every 6 (six) hours as needed for wheezing or shortness of breath.     . Ascorbic Acid (VITAMIN C PO) Take 1 tablet by mouth daily.    . cetirizine (ZYRTEC ALLERGY) 10 MG tablet Take 1 tablet (10 mg total) by mouth 2 (two) times daily. 60 tablet 3  . Ferrous Sulfate (IRON PO) Take 1 tablet by mouth daily.    . fluticasone-salmeterol (ADVAIR HFA) 115-21 MCG/ACT inhaler Inhale 2 puffs into the lungs 2 (two) times daily as needed (for flares).     . gabapentin (NEURONTIN) 300 MG capsule Take 300 mg by mouth daily.     . hydrochlorothiazide (HYDRODIURIL) 12.5 MG tablet Take 12.5 mg by mouth daily.    . insulin glargine (LANTUS) 100 UNIT/ML injection Inject 18 Units into the skin at bedtime.     Marland Kitchen. ipratropium (ATROVENT) 0.03 % nasal spray Place 2 sprays into both nostrils 2 (two) times daily.    Marland Kitchen. losartan (COZAAR) 50 MG tablet Take 50 mg by mouth daily.    . pravastatin (PRAVACHOL) 20 MG tablet Take 20 mg by mouth at bedtime.     . sertraline (ZOLOFT) 50  MG tablet Take 50 mg by mouth at bedtime.     . valACYclovir (VALTREX) 1000 MG tablet Take 1 tablet (1,000 mg total) by mouth 3 (three) times daily. 21 tablet 0  . VITAMIN D PO Take 1 tablet by mouth daily.    . famotidine (PEPCID) 20 MG tablet Take 1 tablet (20 mg total) by mouth 2 (two) times daily. 60 tablet 5  . triamcinolone ointment (KENALOG) 0.1 % Apply 1 application topically 2 (two) times daily. As needed for the hives. Do not use on the face, neck, groin area or under armpits. 453.6 g 1   Current Facility-Administered Medications  Medication Dose Route Frequency Provider Last Rate Last Dose  . omalizumab Geoffry Paradise(XOLAIR) injection 150 mg  150 mg Subcutaneous Q28 days Ellamae SiaKim, Cailean Heacock M, DO   300 mg at  04/19/19 1613   Allergies: Allergies  Allergen Reactions  . Percocet [Oxycodone-Acetaminophen] Hives   I reviewed her past medical history, social history, family history, and environmental history and no significant changes have been reported from previous visit on 03/22/2019.  Review of Systems  Constitutional: Negative for appetite change, chills, fever and unexpected weight change.  HENT: Negative for congestion, rhinorrhea and sneezing.   Eyes: Negative for itching.  Respiratory: Negative for cough, chest tightness, shortness of breath and wheezing.   Cardiovascular: Negative for chest pain.  Gastrointestinal: Negative for abdominal pain.  Genitourinary: Negative for difficulty urinating.  Skin: Positive for rash.  Allergic/Immunologic: Positive for environmental allergies. Negative for food allergies.  Neurological: Negative for headaches.   Objective: BP 140/60   Pulse 81   Temp 97.7 F (36.5 C) (Temporal)   Resp 16   Ht 5' 1.5" (1.562 m)   SpO2 97%   BMI 39.67 kg/m  Body mass index is 39.67 kg/m. Physical Exam  Constitutional: She is oriented to person, place, and time. She appears well-developed and well-nourished.  HENT:  Head: Normocephalic and atraumatic.  Right Ear:  External ear normal.  Left Ear: External ear normal.  Nose: Nose normal.  Mouth/Throat: Oropharynx is clear and moist.  Eyes: Conjunctivae and EOM are normal.  Neck: Neck supple.  Cardiovascular: Normal rate and regular rhythm. Exam reveals no gallop and no friction rub.  Murmur heard. Pulmonary/Chest: Effort normal and breath sounds normal. She has no wheezes. She has no rales.  Abdominal: Soft.  Neurological: She is alert and oriented to person, place, and time.  Skin: Skin is warm. Rash noted.  Diffuse erythematous patches on lower extremities b/l, upper extremities b/l and torso. Some areas of raised erythematous rashes.   Psychiatric: She has a normal mood and affect. Her behavior is normal.  Nursing note and vitals reviewed.  Previous notes and tests were reviewed. The plan was reviewed with the patient/family, and all questions/concerned were addressed.  It was my pleasure to see Jazzmyn today and participate in her care. Please feel free to contact me with any questions or concerns.  Sincerely,  Wyline MoodYoon Merlin Golden, DO Allergy & Immunology  [facility] [place] office: [phone] [facility] office: [phone] [place] office: [phone]

## 2019-04-19 NOTE — Telephone Encounter (Signed)
Dr. Maudie Mercury start patient on Xolair injections and samples were given in office  Frequency: 4 weeks Next appt 05/16/2019 @ 9 am Dx. L50.8

## 2019-04-19 NOTE — Patient Instructions (Addendum)
Chronic urticaria  Start Xolair injections 300mg  every 4 weeks.   Start prednisone taper.  Start allegra 180mg  in the morning and zyrtec 20mg  at night.  Start pepcid 20mg  twice a day.   Monitor symptoms.   May use triamcinolone cream twice a day on hives as needed. Do not use on face, groin area or under the armpits. Do not use more than 3 weeks at a time.   I have prescribed epinephrine injectable and demonstrated proper use. For mild symptoms you can take over the counter antihistamines such as Benadryl and monitor symptoms closely. If symptoms worsen or if you have severe symptoms including breathing issues, throat closure, significant swelling, whole body hives, severe diarrhea and vomiting, lightheadedness then inject epinephrine and seek immediate medical care afterwards.  Avoid the following potential triggers: alcohol, tight clothing, NSAIDs.   Other allergic rhinitis  Past skin testing showed: Positive to ragweed and dust mites, molds and cockroaches  Continue environmental control measures.  Continue Flonase 1 spray per nostril 1-2 times a day as needed for nasal congestion.   Asthma  Daily controller medication(s):continue Advair 110 2 puffs twice a day with spacer and rinse mouth afterwards. Spacer given and demonstrated proper use.   Prior to physical activity:May use albuterol rescue inhaler 2 puffs 5 to 15 minutes prior to strenuous physical activities.  Rescue medications:May use albuterol rescue inhaler 2 puffs or nebulizer every 4 to 6 hours as needed for shortness of breath, chest tightness, coughing, and wheezing. Monitor frequency of use.  Asthma control goals:  Full participation in all desired activities (may need albuterol before activity) Albuterol use two times or less a week on average (not counting use with activity) Cough interfering with sleep two times or less a month Oral steroids no more than once a year No hospitalizations  Follow up in  4 weeks

## 2019-04-19 NOTE — Progress Notes (Signed)
Immunotherapy   Patient Details  Name: Shelley Mccoy MRN: [id number] Date of Birth: [birth year]  04/19/2019  Corinna Lines started injections for  Xolair 300 mg Following schedule Frequency : 4 weeks    Epi-Pen:Prescription for Epi-Pen given Consent signed and patient instructions given. Patient was given injection during her office visit and tolerated injections in both arms well with no C/O problems associated with injection site. Patient waited her 30 mins in the patient room and is doing well.    Festus Holts Rashaud Ybarbo 04/19/2019, 4:27 PM

## 2019-04-20 ENCOUNTER — Encounter: Payer: Self-pay | Admitting: Allergy

## 2019-04-20 DIAGNOSIS — J3089 Other allergic rhinitis: Secondary | ICD-10-CM | POA: Insufficient documentation

## 2019-04-20 DIAGNOSIS — J4541 Moderate persistent asthma with (acute) exacerbation: Secondary | ICD-10-CM | POA: Insufficient documentation

## 2019-04-20 DIAGNOSIS — J4540 Moderate persistent asthma, uncomplicated: Secondary | ICD-10-CM | POA: Insufficient documentation

## 2019-04-20 DIAGNOSIS — H1010 Acute atopic conjunctivitis, unspecified eye: Secondary | ICD-10-CM | POA: Insufficient documentation

## 2019-04-20 NOTE — Assessment & Plan Note (Signed)
Past history - Breaking out in rash/hives for the past 3 years.  Urticaria panel unremarkable.  Interim history - daily hives despite taking zyrtec 10mg  BID and prednisone. Zyrtec causes some drowsiness.   Start Xolair injections 300mg  every 4 weeks. First dose given today in the office with no issues.  I have prescribed epinephrine injectable and demonstrated proper use. For mild symptoms you can take over the counter antihistamines such as Benadryl and monitor symptoms closely. If symptoms worsen or if you have severe symptoms including breathing issues, throat closure, significant swelling, whole body hives, severe diarrhea and vomiting, lightheadedness then inject epinephrine and seek immediate medical care afterwards.  Start prednisone taper.  Start allegra 180mg  in the morning and zyrtec 20mg  at night.  Start pepcid 20mg  twice a day.   Monitor symptoms.   May use triamcinolone cream twice a day on hives as needed. Do not use on face, groin area or under the armpits. Do not use more than 3 weeks at a time.   Avoid the following potential triggers: alcohol, tight clothing, NSAIDs.

## 2019-04-20 NOTE — Assessment & Plan Note (Signed)
>>  ASSESSMENT AND PLAN FOR MODERATE PERSISTENT ASTHMA WITHOUT COMPLICATION WRITTEN ON 01/09/2439  9:50 AM BY Shelley Sierras, DO  Past history - Diagnosed with asthma 4 years ago.  2020 spirometry showed: possible restrictive disease and 21% improvement in FEV1 post bronchodilator treatment.   Interim history - stable.   Daily controller medication(s):continue Advair 115 2 puffs twice a day with spacer and rinse mouth afterwards.  Prior to physical activity:May use albuterol rescue inhaler 2 puffs 5 to 15 minutes prior to strenuous physical activities.  Rescue medications:May use albuterol rescue inhaler 2 puffs or nebulizer every 4 to 6 hours as needed for shortness of breath, chest tightness, coughing, and wheezing. Monitor frequency of use.   Repeat spirometry at next visit.

## 2019-04-20 NOTE — Assessment & Plan Note (Signed)
Past history - Diagnosed with asthma 4 years ago.  2020 spirometry showed: possible restrictive disease and 21% improvement in FEV1 post bronchodilator treatment.   Interim history - stable.   Daily controller medication(s):continue Advair 115 2 puffs twice a day with spacer and rinse mouth afterwards.  Prior to physical activity:May use albuterol rescue inhaler 2 puffs 5 to 15 minutes prior to strenuous physical activities.  Rescue medications:May use albuterol rescue inhaler 2 puffs or nebulizer every 4 to 6 hours as needed for shortness of breath, chest tightness, coughing, and wheezing. Monitor frequency of use.   Repeat spirometry at next visit.

## 2019-04-20 NOTE — Telephone Encounter (Signed)
GOT IT  THANKS

## 2019-04-20 NOTE — Assessment & Plan Note (Signed)
Past history - Rhinoconjunctivitis symptoms for the past 5 to 10 years mainly during the pollen seasons.  Using Allegra and Atrovent nasal spray with some benefit. 2020 skin testing showed: Positive to ragweed and dust mites, molds and cockroaches. Interim history - Stable.   Continue environmental control measures.  Continue Flonase 1 spray per nostril 1-2 times a day as needed for nasal congestion.

## 2019-05-17 ENCOUNTER — Other Ambulatory Visit: Payer: Self-pay

## 2019-05-17 ENCOUNTER — Ambulatory Visit (INDEPENDENT_AMBULATORY_CARE_PROVIDER_SITE_OTHER): Payer: Commercial Managed Care - PPO | Admitting: *Deleted

## 2019-05-17 ENCOUNTER — Ambulatory Visit (INDEPENDENT_AMBULATORY_CARE_PROVIDER_SITE_OTHER): Payer: Commercial Managed Care - PPO | Admitting: Allergy

## 2019-05-17 ENCOUNTER — Encounter: Payer: Self-pay | Admitting: Allergy

## 2019-05-17 VITALS — BP 118/84 | HR 66 | Resp 16 | Ht 59.7 in | Wt 223.2 lb

## 2019-05-17 DIAGNOSIS — H1010 Acute atopic conjunctivitis, unspecified eye: Secondary | ICD-10-CM

## 2019-05-17 DIAGNOSIS — J4540 Moderate persistent asthma, uncomplicated: Secondary | ICD-10-CM

## 2019-05-17 DIAGNOSIS — L508 Other urticaria: Secondary | ICD-10-CM

## 2019-05-17 DIAGNOSIS — L501 Idiopathic urticaria: Secondary | ICD-10-CM | POA: Diagnosis not present

## 2019-05-17 DIAGNOSIS — J3089 Other allergic rhinitis: Secondary | ICD-10-CM

## 2019-05-17 DIAGNOSIS — J302 Other seasonal allergic rhinitis: Secondary | ICD-10-CM | POA: Diagnosis not present

## 2019-05-17 NOTE — Patient Instructions (Addendum)
Chronic urticaria  Continue Xolair injections 300mg  every 4 weeks.  Continue allegra 180mg  in the morning and decrease zyrtec 10mg  at night.  Continue pepcid 20mg  twice a day.   Monitor symptoms.   Stop the triamcinolone ointment.   Avoid the following potential triggers: alcohol, tight clothing, NSAIDs.  Moderate persistent asthma without complication  Daily controller medication(s):continue Advair 115 2 puffs twice a day with spacer and rinse mouth afterwards.  Prior to physical activity:May use albuterol rescue inhaler 2 puffs 5 to 15 minutes prior to strenuous physical activities.  Rescue medications:May use albuterol rescue inhaler 2 puffs or nebulizer every 4 to 6 hours as needed for shortness of breath, chest tightness, coughing, and wheezing. Monitor frequency of use. Asthma control goals:  Full participation in all desired activities (may need albuterol before activity) Albuterol use two times or less a week on average (not counting use with activity) Cough interfering with sleep two times or less a month Oral steroids no more than once a year No hospitalizations  Seasonal and perennial allergic rhinoconjunctivitis 2020 skin testing showed: Positive to ragweed and dust mites, molds and cockroaches.  Continue environmental control measures.  Continue Flonase 1 spray per nostril 1-2 times a day as needed for nasal congestion.   Follow up in 2 months.  Skin care recommendations  Bath time: . Always use lukewarm water. AVOID very hot or cold water. Marland Kitchen Keep bathing time to 5-10 minutes. . Do NOT use bubble bath. . Use a mild soap and use just enough to wash the dirty areas. . Do NOT scrub skin vigorously.  . After bathing, pat dry your skin with a towel. Do NOT rub or scrub the skin.  Moisturizers and prescriptions:  . ALWAYS apply moisturizers immediately after bathing (within 3 minutes). This helps to lock-in moisture. . Use the moisturizer several times a  day over the whole body. Kermit Balo summer moisturizers include: Aveeno, CeraVe, Cetaphil. Kermit Balo winter moisturizers include: Aquaphor, Vaseline, Cerave, Cetaphil, Eucerin, Vanicream. . When using moisturizers along with medications, the moisturizer should be applied about one hour after applying the medication to prevent diluting effect of the medication or moisturize around where you applied the medications. When not using medications, the moisturizer can be continued twice daily as maintenance.  Laundry and clothing: . Avoid laundry products with added color or perfumes. . Use unscented hypo-allergenic laundry products such as Tide free, Cheer free & gentle, and All free and clear.  . If the skin still seems dry or sensitive, you can try double-rinsing the clothes. . Avoid tight or scratchy clothing such as wool. . Do not use fabric softeners or dyer sheets.

## 2019-05-17 NOTE — Progress Notes (Signed)
Follow Up Note  RE: Shelley Mccoy MRN: [id number] DOB: 02/26/1971 Date of Office Visit: 05/17/2019  Referring provider: No ref. provider found Primary care provider: Dorena Dew, FNP  Chief Complaint: Allergy Testing (Doing Good) and Asthma (Doing Good)  History of Present Illness: I had the pleasure of seeing Shelley Mccoy for a follow up visit at the [facility] on 05/18/2019. She is a 48 y.o. female, who is being followed for chronic urticaria, asthma, and allergies. Today she is here for 4 week follow up visit. No new complaints. She has not experienced any hives since starting her Xolair injections. Tolerating them well. Reports using rescue inhaler 3-4 times since last visit. No ER/urgent care visits or prednisone use. Her allergies symptoms have been well controlled on current medication. Her previous allergy office visit was on 04/19/2019 with Dr. Maudie Mercury.   Assessment and Plan: Shelley Mccoy is a 48 y.o. female with: Chronic urticaria Past history - Breaking out in rash/hives for the past 3 years.  Urticaria panel unremarkable.  Interim history - no hives since starting Xolair injections. Taking allegra and taking Zyrtec 10mg  BID. Zyrtec causes some drowsiness.   Continue Xolair injections 300mg  every 4 weeks.  Continue allegra 180mg  in the morning and decrease to zyrtec 10mg  at night.  Continue pepcid 20mg  twice a day.   Monitor symptoms.   Stop the triamcinolone ointment and do not use as a moisturizer.   Avoid the following potential triggers: alcohol, tight clothing, NSAIDs.  Discussed proper skin care.   Moderate persistent asthma without complication Past history - Diagnosed with asthma 4 years ago.  2020 spirometry showed: possible restrictive disease and 21% improvement in FEV1 post bronchodilator treatment.   Interim history - Using albuterol at least once a week with good benefit.  Today's spirometry showed some restriction.   Daily  controller medication(s):continue Advair 115 2 puffs twice a day with spacer and rinse mouth afterwards.  Prior to physical activity:May use albuterol rescue inhaler 2 puffs 5 to 15 minutes prior to strenuous physical activities.  Rescue medications:May use albuterol rescue inhaler 2 puffs or nebulizer every 4 to 6 hours as needed for shortness of breath, chest tightness, coughing, and wheezing. Monitor frequency of use.  Repeat spirometry at next visit.   Seasonal and perennial allergic rhinoconjunctivitis Past history - Rhinoconjunctivitis symptoms for the past 5 to 10 years mainly during the pollen seasons.  Using Allegra and Atrovent nasal spray with some benefit. 2020 skin testing showed: Positive to ragweed and dust mites, molds and cockroaches. Interim history - Stable.   Continue environmental control measures.  Continue Flonase 1 spray per nostril 1-2 times a day as needed for nasal congestion.   Return in about 2 months (around 07/17/2019).  Diagnostics: Spirometry:  Tracings reviewed. Her effort: Good reproducible efforts. FVC: 1.41 L FEV1: 1.14 L, 59% predicted FEV1/FVC ratio: 74% Interpretation: Spirometry consistent with moderate restriction.  Please see scanned spirometry results for details.  Medication List:  Current Outpatient Medications  Medication Sig Dispense Refill  . albuterol (VENTOLIN HFA) 108 (90 Base) MCG/ACT inhaler Inhale into the lungs every 6 (six) hours as needed for wheezing or shortness of breath.     . Ascorbic Acid (VITAMIN C PO) Take 1 tablet by mouth daily.    . cetirizine (ZYRTEC ALLERGY) 10 MG tablet Take 1 tablet (10 mg total) by mouth 2 (two) times daily. 60 tablet 3  . EPINEPHrine 0.3 mg/0.3 mL IJ SOAJ injection     .  famotidine (PEPCID) 20 MG tablet Take 1 tablet (20 mg total) by mouth 2 (two) times daily. 60 tablet 5  . Ferrous Sulfate (IRON PO) Take 1 tablet by mouth daily.    . fluticasone-salmeterol (ADVAIR HFA) 115-21 MCG/ACT  inhaler Inhale 2 puffs into the lungs 2 (two) times daily as needed (for flares).     . gabapentin (NEURONTIN) 300 MG capsule Take 300 mg by mouth daily.     . hydrochlorothiazide (HYDRODIURIL) 12.5 MG tablet Take 12.5 mg by mouth daily.    . insulin glargine (LANTUS) 100 UNIT/ML injection Inject 18 Units into the skin at bedtime.     Marland Kitchen. ipratropium (ATROVENT) 0.03 % nasal spray Place 2 sprays into both nostrils 2 (two) times daily.    Marland Kitchen. losartan (COZAAR) 50 MG tablet Take 50 mg by mouth daily.    . pravastatin (PRAVACHOL) 20 MG tablet Take 20 mg by mouth at bedtime.     . sertraline (ZOLOFT) 50 MG tablet Take 50 mg by mouth at bedtime.     . valACYclovir (VALTREX) 1000 MG tablet Take 1 tablet (1,000 mg total) by mouth 3 (three) times daily. 21 tablet 0  . VITAMIN D PO Take 1 tablet by mouth daily.     Current Facility-Administered Medications  Medication Dose Route Frequency Provider Last Rate Last Dose  . omalizumab Geoffry Paradise(XOLAIR) injection 150 mg  150 mg Subcutaneous Q28 days Ellamae SiaKim, Yoon M, DO   150 mg at 05/17/19 [id number]   Allergies: Allergies  Allergen Reactions  . Percocet [Oxycodone-Acetaminophen] Hives   I reviewed her past medical history, social history, family history, and environmental history and no significant changes have been reported from previous visit on 04/19/2019.  Review of Systems  Constitutional: Negative for appetite change, chills, fever and unexpected weight change.  HENT: Negative for congestion, rhinorrhea and sneezing.   Eyes: Negative for itching.  Respiratory: Negative for cough, chest tightness, shortness of breath and wheezing.   Cardiovascular: Negative for chest pain.  Gastrointestinal: Negative for abdominal pain.  Genitourinary: Negative for difficulty urinating.  Skin: Negative for rash.  Allergic/Immunologic: Positive for environmental allergies. Negative for food allergies.  Neurological: Negative for headaches.   Objective: BP 118/84 (BP Location: Right  Arm, Patient Position: Sitting, Cuff Size: Large)   Pulse 66   Resp 16   Ht 4' 11.7" (1.516 m)   Wt 223 lb 3.2 oz (101.2 kg)   SpO2 98%   BMI 44.03 kg/m  Body mass index is 44.03 kg/m. Physical Exam  Constitutional: She is oriented to person, place, and time. She appears well-developed and well-nourished.  HENT:  Head: Normocephalic and atraumatic.  Right Ear: External ear normal.  Left Ear: External ear normal.  Nose: Nose normal.  Mouth/Throat: Oropharynx is clear and moist.  Eyes: Conjunctivae and EOM are normal. Right eye exhibits no discharge. Left eye exhibits no discharge.  Neck: Neck supple.  Cardiovascular: Normal rate and regular rhythm. Exam reveals no gallop and no friction rub.  Murmur heard. Pulmonary/Chest: Effort normal. She has no wheezes. She has no rales.  Abdominal: Soft.  Neurological: She is alert and oriented to person, place, and time.  Skin: Skin is warm and dry. No rash noted.  Psychiatric: She has a normal mood and affect. Her behavior is normal.  Nursing note and vitals reviewed.  Previous notes and tests were reviewed. The plan was reviewed with the patient/family, and all questions/concerned were addressed.  Thurmon FairJeff Bernie Ransford, MD PGY1  [phone]

## 2019-05-18 ENCOUNTER — Encounter: Payer: Self-pay | Admitting: Allergy

## 2019-05-18 NOTE — Assessment & Plan Note (Signed)
Past history - Rhinoconjunctivitis symptoms for the past 5 to 10 years mainly during the pollen seasons.  Using Allegra and Atrovent nasal spray with some benefit. 2020 skin testing showed: Positive to ragweed and dust mites, molds and cockroaches. Interim history - Stable.   Continue environmental control measures.  Continue Flonase 1 spray per nostril 1-2 times a day as needed for nasal congestion.  

## 2019-05-18 NOTE — Assessment & Plan Note (Signed)
Past history - Breaking out in rash/hives for the past 3 years.  Urticaria panel unremarkable.  Interim history - no hives since started Xolair.  Continue Xolair injections 300mg  every 4 weeks.  Continue allegra 180mg  in the morning and decrease to zyrtec 10mg  at night.  Continue pepcid 20mg  twice a day.   Monitor symptoms.   Stop the triamcinolone ointment and do not use as a moisturizer.   Avoid the following potential triggers: alcohol, tight clothing, NSAIDs.  Discussed proper skin care.

## 2019-05-18 NOTE — Assessment & Plan Note (Signed)
>>  ASSESSMENT AND PLAN FOR MODERATE PERSISTENT ASTHMA WITHOUT COMPLICATION WRITTEN ON 1/61/0960  1:36 PM BY Garnet Sierras, DO  Past history - Diagnosed with asthma 4 years ago.  2020 spirometry showed: possible restrictive disease and 21% improvement in FEV1 post bronchodilator treatment.   Interim history - Using albuterol at least once a week with good benefit.  Today's spirometry showed some restriction.   Daily controller medication(s):continue Advair 115 2 puffs twice a day with spacer and rinse mouth afterwards.  Prior to physical activity:May use albuterol rescue inhaler 2 puffs 5 to 15 minutes prior to strenuous physical activities.  Rescue medications:May use albuterol rescue inhaler 2 puffs or nebulizer every 4 to 6 hours as needed for shortness of breath, chest tightness, coughing, and wheezing. Monitor frequency of use.  Repeat spirometry at next visit.

## 2019-05-18 NOTE — Assessment & Plan Note (Addendum)
Past history - Diagnosed with asthma 4 years ago.  2020 spirometry showed: possible restrictive disease and 21% improvement in FEV1 post bronchodilator treatment.   Interim history - Using albuterol at least once a week with good benefit.  Today's spirometry showed some restriction.   Daily controller medication(s):continue Advair 115 2 puffs twice a day with spacer and rinse mouth afterwards.  Prior to physical activity:May use albuterol rescue inhaler 2 puffs 5 to 15 minutes prior to strenuous physical activities.  Rescue medications:May use albuterol rescue inhaler 2 puffs or nebulizer every 4 to 6 hours as needed for shortness of breath, chest tightness, coughing, and wheezing. Monitor frequency of use.  Repeat spirometry at next visit.

## 2019-05-24 ENCOUNTER — Ambulatory Visit: Payer: No Typology Code available for payment source | Admitting: Allergy

## 2019-06-14 ENCOUNTER — Ambulatory Visit: Payer: Self-pay

## 2019-06-20 ENCOUNTER — Ambulatory Visit (INDEPENDENT_AMBULATORY_CARE_PROVIDER_SITE_OTHER): Payer: Commercial Managed Care - PPO | Admitting: *Deleted

## 2019-06-20 ENCOUNTER — Other Ambulatory Visit: Payer: Self-pay

## 2019-06-20 DIAGNOSIS — L501 Idiopathic urticaria: Secondary | ICD-10-CM

## 2019-06-20 DIAGNOSIS — L508 Other urticaria: Secondary | ICD-10-CM

## 2019-07-18 ENCOUNTER — Other Ambulatory Visit: Payer: Self-pay

## 2019-07-18 ENCOUNTER — Ambulatory Visit (INDEPENDENT_AMBULATORY_CARE_PROVIDER_SITE_OTHER): Payer: Commercial Managed Care - PPO

## 2019-07-18 ENCOUNTER — Ambulatory Visit: Payer: Self-pay

## 2019-07-18 ENCOUNTER — Ambulatory Visit
Admission: RE | Admit: 2019-07-18 | Discharge: 2019-07-18 | Disposition: A | Discharge status: Home / Self Care | Payer: Commercial Managed Care - PPO | Source: Ambulatory Visit | Attending: Obstetrics and Gynecology | Admitting: Obstetrics and Gynecology

## 2019-07-18 DIAGNOSIS — L501 Idiopathic urticaria: Secondary | ICD-10-CM | POA: Diagnosis not present

## 2019-07-18 DIAGNOSIS — L508 Other urticaria: Secondary | ICD-10-CM

## 2019-07-18 DIAGNOSIS — Z01419 Encounter for gynecological examination (general) (routine) without abnormal findings: Secondary | ICD-10-CM

## 2019-07-18 MED ORDER — OMALIZUMAB 150 MG ~~LOC~~ SOLR
300.0000 mg | SUBCUTANEOUS | Status: AC
  Administered 2019-07-18 – 2024-10-02 (×63): 300 mg via SUBCUTANEOUS

## 2019-08-07 ENCOUNTER — Ambulatory Visit: Payer: Self-pay | Admitting: Obstetrics and Gynecology

## 2019-08-10 ENCOUNTER — Ambulatory Visit (INDEPENDENT_AMBULATORY_CARE_PROVIDER_SITE_OTHER): Payer: Commercial Managed Care - PPO | Admitting: Obstetrics and Gynecology

## 2019-08-10 ENCOUNTER — Encounter: Payer: Self-pay | Admitting: Obstetrics and Gynecology

## 2019-08-10 ENCOUNTER — Other Ambulatory Visit: Payer: Self-pay

## 2019-08-10 VITALS — BP 132/83 | HR 72 | Ht <= 58 in | Wt 217.8 lb

## 2019-08-10 DIAGNOSIS — Z01419 Encounter for gynecological examination (general) (routine) without abnormal findings: Secondary | ICD-10-CM | POA: Diagnosis not present

## 2019-08-10 DIAGNOSIS — N898 Other specified noninflammatory disorders of vagina: Secondary | ICD-10-CM | POA: Diagnosis not present

## 2019-08-10 DIAGNOSIS — Z113 Encounter for screening for infections with a predominantly sexual mode of transmission: Secondary | ICD-10-CM

## 2019-08-10 MED ORDER — ESTROGENS, CONJUGATED 0.625 MG/GM VA CREA: 1.0000 | TOPICAL_CREAM | Freq: Every day | VAGINAL | 12 refills | Status: DC

## 2019-08-10 NOTE — Progress Notes (Signed)
Subjective:     Shelley Mccoy is a 48 y.o. female P3 with BMI 80 who is here for a comprehensive physical exam. The patient reports no problems. She is sexually active without complaints. She denies pelvic pain or abnormal discharge. She denies urinary incontinence. She reports some occasional vaginal spotting following intercourse. She desires full STI testing  Past Medical History:  Diagnosis Date  . Anemia   . Angioedema of lips 03/22/2019  . Asthma   . Chronic urticaria 03/22/2019  . Diabetes mellitus without complication (HCC)   . Heart murmur   . History of uterine fibroid   . HSV-2 (herpes simplex virus 2) infection   . Hypertension   . Seasonal allergies    Past Surgical History:  Procedure Laterality Date  . CYSTECTOMY     hand and breast  . LAPAROSCOPIC ABDOMINAL EXPLORATION    . TOTAL ABDOMINAL HYSTERECTOMY     Family History  Problem Relation Age of Onset  . Diabetes Mother   . Congestive Heart Failure Mother   . Kidney failure Mother   . Hypertension Mother   . Asthma Mother   . Prostate cancer Father   . Hypertension Sister   . Diabetes Maternal Grandmother   . Congestive Heart Failure Maternal Grandmother   . Kidney failure Maternal Grandmother   . Alzheimer's disease Paternal Grandmother     Social History   Socioeconomic History  . Marital status: Single    Spouse name: Not on file  . Number of children: Not on file  . Years of education: Not on file  . Highest education level: Not on file  Occupational History  . Not on file  Social Needs  . Financial resource strain: Not on file  . Food insecurity    Worry: Not on file    Inability: Not on file  . Transportation needs    Medical: Not on file    Non-medical: Not on file  Tobacco Use  . Smoking status: Never Smoker  . Smokeless tobacco: Never Used  Substance and Sexual Activity  . Alcohol use: No  . Drug use: No  . Sexual activity: Not on file  Lifestyle  . Physical activity    Days  per week: Not on file    Minutes per session: Not on file  . Stress: Not on file  Relationships  . Social Musician on phone: Not on file    Gets together: Not on file    Attends religious service: Not on file    Active member of club or organization: Not on file    Attends meetings of clubs or organizations: Not on file    Relationship status: Not on file  . Intimate partner violence    Fear of current or ex partner: Not on file    Emotionally abused: Not on file    Physically abused: Not on file    Forced sexual activity: Not on file  Other Topics Concern  . Not on file  Social History Narrative  . Not on file   Health Maintenance  Topic Date Due  . PNEUMOCOCCAL POLYSACCHARIDE VACCINE AGE 24-64 HIGH RISK  05/08/1971  . OPHTHALMOLOGY EXAM  05/08/1979  . HEMOGLOBIN A1C  08/11/2018  . FOOT EXAM  02/09/2019  . INFLUENZA VACCINE  04/29/2019  . COLONOSCOPY  05/08/2019  . PAP SMEAR-Modifier  07/05/2020  . MAMMOGRAM  07/17/2021  . TETANUS/TDAP  08/04/2027  . HIV Screening  Completed  Review of Systems Pertinent items are noted in HPI.   Objective:  Blood pressure 132/83, pulse 72, height 4\' 9"  (1.448 m), weight 217 lb 12.8 oz (98.8 kg).     GENERAL: Well-developed, well-nourished female in no acute distress.  HEENT: Normocephalic, atraumatic. Sclerae anicteric.  NECK: Supple. Normal thyroid.  LUNGS: Clear to auscultation bilaterally.  HEART: Regular rate and rhythm. BREASTS: Symmetric in size. No palpable masses or lymphadenopathy, skin changes, or nipple drainage. ABDOMEN: Soft, nontender, nondistended. No organomegaly. PELVIC: Normal external female genitalia. Vagina is pale.  Normal discharge. No adnexal mass or tenderness. EXTREMITIES: No cyanosis, clubbing, or edema, 2+ distal pulses.    Assessment:    Healthy female exam.      Plan:    Patient with normal mammogram 06/2019 STI screen ordered Colonoscopy reported normal Rx premarin cream  provided Patient will be contacted with abnormal results See After Visit Summary for Counseling Recommendations

## 2019-08-10 NOTE — Progress Notes (Signed)
Pt reports that she is currently SA intermittently and that she experiences some light spotting every time she has intercourse.

## 2019-08-11 LAB — CERVICOVAGINAL ANCILLARY ONLY
Bacterial Vaginitis (gardnerella): NEGATIVE
Candida Glabrata: NEGATIVE
Candida Vaginitis: NEGATIVE
Chlamydia: NEGATIVE
Comment: NEGATIVE
Comment: NEGATIVE
Comment: NEGATIVE
Comment: NEGATIVE
Comment: NEGATIVE
Comment: NORMAL
Neisseria Gonorrhea: NEGATIVE
Trichomonas: NEGATIVE

## 2019-08-11 LAB — RPR: RPR Ser Ql: NONREACTIVE

## 2019-08-11 LAB — HEPATITIS C ANTIBODY: Hep C Virus Ab: <0.1 s/co ratio (ref 0.0–0.9)

## 2019-08-11 LAB — HEPATITIS B SURFACE ANTIGEN: Hepatitis B Surface Ag: NEGATIVE

## 2019-08-11 LAB — HIV ANTIBODY (ROUTINE TESTING W REFLEX): HIV Screen 4th Generation wRfx: NONREACTIVE

## 2019-08-15 ENCOUNTER — Ambulatory Visit (INDEPENDENT_AMBULATORY_CARE_PROVIDER_SITE_OTHER): Payer: Commercial Managed Care - PPO | Admitting: *Deleted

## 2019-08-15 ENCOUNTER — Other Ambulatory Visit: Payer: Self-pay

## 2019-08-15 DIAGNOSIS — L508 Other urticaria: Secondary | ICD-10-CM

## 2019-08-15 DIAGNOSIS — L501 Idiopathic urticaria: Secondary | ICD-10-CM

## 2019-09-12 ENCOUNTER — Ambulatory Visit (INDEPENDENT_AMBULATORY_CARE_PROVIDER_SITE_OTHER): Payer: Commercial Managed Care - PPO

## 2019-09-12 ENCOUNTER — Other Ambulatory Visit: Payer: Self-pay

## 2019-09-12 DIAGNOSIS — L501 Idiopathic urticaria: Secondary | ICD-10-CM

## 2019-09-12 DIAGNOSIS — L508 Other urticaria: Secondary | ICD-10-CM

## 2019-10-10 ENCOUNTER — Ambulatory Visit (INDEPENDENT_AMBULATORY_CARE_PROVIDER_SITE_OTHER): Payer: Commercial Managed Care - PPO

## 2019-10-10 ENCOUNTER — Other Ambulatory Visit: Payer: Self-pay

## 2019-10-10 DIAGNOSIS — L501 Idiopathic urticaria: Secondary | ICD-10-CM | POA: Diagnosis not present

## 2019-10-10 DIAGNOSIS — L508 Other urticaria: Secondary | ICD-10-CM

## 2019-11-07 ENCOUNTER — Other Ambulatory Visit: Payer: Self-pay

## 2019-11-07 ENCOUNTER — Ambulatory Visit (INDEPENDENT_AMBULATORY_CARE_PROVIDER_SITE_OTHER): Payer: Commercial Managed Care - PPO

## 2019-11-07 DIAGNOSIS — L501 Idiopathic urticaria: Secondary | ICD-10-CM | POA: Diagnosis not present

## 2019-11-07 DIAGNOSIS — L508 Other urticaria: Secondary | ICD-10-CM

## 2019-12-02 ENCOUNTER — Other Ambulatory Visit: Payer: Self-pay

## 2019-12-02 ENCOUNTER — Encounter (HOSPITAL_COMMUNITY): Payer: Self-pay

## 2019-12-02 ENCOUNTER — Emergency Department (HOSPITAL_COMMUNITY)
Admission: EM | Admit: 2019-12-02 | Discharge: 2019-12-02 | Disposition: A | Discharge status: Home / Self Care | Payer: Commercial Managed Care - PPO | Attending: Emergency Medicine | Admitting: Emergency Medicine

## 2019-12-02 DIAGNOSIS — R200 Anesthesia of skin: Secondary | ICD-10-CM | POA: Insufficient documentation

## 2019-12-02 DIAGNOSIS — J45909 Unspecified asthma, uncomplicated: Secondary | ICD-10-CM | POA: Insufficient documentation

## 2019-12-02 DIAGNOSIS — E119 Type 2 diabetes mellitus without complications: Secondary | ICD-10-CM | POA: Insufficient documentation

## 2019-12-02 DIAGNOSIS — R2689 Other abnormalities of gait and mobility: Secondary | ICD-10-CM

## 2019-12-02 DIAGNOSIS — Z79899 Other long term (current) drug therapy: Secondary | ICD-10-CM | POA: Insufficient documentation

## 2019-12-02 DIAGNOSIS — Z794 Long term (current) use of insulin: Secondary | ICD-10-CM | POA: Insufficient documentation

## 2019-12-02 DIAGNOSIS — Z8673 Personal history of transient ischemic attack (TIA), and cerebral infarction without residual deficits: Secondary | ICD-10-CM | POA: Diagnosis not present

## 2019-12-02 LAB — CBG MONITORING, ED: Glucose-Capillary: 96 mg/dL (ref 70–99)

## 2019-12-02 NOTE — ED Triage Notes (Addendum)
Patient states she went to work today and her right leg was fine. Patient states she began not being able to bear weight on her right leg. Patient states she continued to work. Patient denies any injury.  When patient was questioned about pain, patient stated, "I can not feel my leg." Writer then asked the patient how she was able to work her shift and patient stated, "I drug my leg around with me."

## 2019-12-02 NOTE — Discharge Instructions (Addendum)
You were given a referral to a neurology office.  The office will contact you to schedule an appointment for follow-up.  Please return to the emergency department for any new or worsening symptoms.

## 2019-12-02 NOTE — ED Provider Notes (Signed)
[facility] DEPT Provider Note   CSN: [id number] Arrival date & time: 12/02/19  1520     History Chief Complaint  Patient presents with  . Leg Pain    Shelley Mccoy is a 49 y.o. female.  HPI   49 year old female with a history of anemia, diabetes, heart murmur, hypertension, who presents the emergency department today for evaluation of a right leg complaint.  She denies that she has any pain in her leg but she states that it "just stopped working "while she was at work today.  She states that she cannot bend her leg and that she cannot feel it.  She denies any back pain or other associated symptoms.  She was able to continue working today.  Past Medical History:  Diagnosis Date  . Anemia   . Angioedema of lips 03/22/2019  . Asthma   . Chronic urticaria 03/22/2019  . Diabetes mellitus without complication ([place])   . Heart murmur   . History of uterine fibroid   . HSV-2 (herpes simplex virus 2) infection   . Hypertension   . Seasonal allergies     Patient Active Problem List   Diagnosis Date Noted  . Moderate persistent asthma without complication 18/29/9371  . Seasonal and perennial allergic rhinoconjunctivitis 04/20/2019  . Chronic urticaria 03/22/2019  . Angioedema of lips 03/22/2019  . Other allergic rhinitis 03/22/2019  . Allergic conjunctivitis of both eyes 03/22/2019  . Morbid obesity with BMI of 40.0-44.9, adult ([place]) 12/01/2017  . Type 2 diabetes mellitus without complication, without long-term current use of insulin ([place]) 12/01/2017  . Essential hypertension 12/01/2017  . Hyperlipidemia LDL goal <100 12/01/2017  . 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 Asthma 01/12/2013  . GERD (gastroesophageal reflux disease) 01/12/2013  . Antral gastritis 07/01/2012    Past Surgical History:  Procedure Laterality Date  . CYSTECTOMY     hand and breast  . LAPAROSCOPIC ABDOMINAL EXPLORATION    . TOTAL ABDOMINAL HYSTERECTOMY       OB  History    Gravida  4   Para  3   Term  3   Preterm      AB  1   Living  3     SAB  1   TAB      Ectopic      Multiple      Live Births  3           Family History  Problem Relation Age of Onset  . Diabetes Mother   . Congestive Heart Failure Mother   . Kidney failure Mother   . Hypertension Mother   . Asthma Mother   . Prostate cancer Father   . Hypertension Sister   . Diabetes Maternal Grandmother   . Congestive Heart Failure Maternal Grandmother   . Kidney failure Maternal Grandmother   . Alzheimer's disease Paternal Grandmother     Social History   Tobacco Use  . Smoking status: Never Smoker  . Smokeless tobacco: Never Used  Substance Use Topics  . Alcohol use: No  . Drug use: No    Home Medications Prior to Admission medications   Medication Sig Start Date End Date Taking? Authorizing Provider  albuterol (VENTOLIN HFA) 108 (90 Base) MCG/ACT inhaler Inhale into the lungs every 6 (six) hours as needed for wheezing or shortness of breath.     [provider]  Ascorbic Acid (VITAMIN C PO) Take 1 tablet by mouth daily.    [provider]  cetirizine (ZYRTEC ALLERGY) 10 MG tablet Take 1 tablet (10 mg total) by mouth 2 (two) times daily. 03/22/19   Ellamae Sia, DO  conjugated estrogens (PREMARIN) vaginal cream Place 1 Applicatorful vaginally daily. 08/10/19   Constant, Peggy, MD  EPINEPHrine 0.3 mg/0.3 mL IJ SOAJ injection  04/19/19   [provider]  famotidine (PEPCID) 20 MG tablet Take 1 tablet (20 mg total) by mouth 2 (two) times daily. 04/19/19   Ellamae Sia, DO  Ferrous Sulfate (IRON PO) Take 1 tablet by mouth daily.    [provider]  fluticasone-salmeterol (ADVAIR HFA) 115-21 MCG/ACT inhaler Inhale 2 puffs into the lungs 2 (two) times daily as needed (for flares).     [provider]  gabapentin (NEURONTIN) 300 MG capsule Take 300 mg by mouth daily.     [provider]  hydrochlorothiazide  (HYDRODIURIL) 12.5 MG tablet Take 12.5 mg by mouth daily.    [provider]  insulin glargine (LANTUS) 100 UNIT/ML injection Inject 18 Units into the skin at bedtime.     [provider]  ipratropium (ATROVENT) 0.03 % nasal spray Place 2 sprays into both nostrils 2 (two) times daily. 12/15/18   [provider]  losartan (COZAAR) 50 MG tablet Take 50 mg by mouth daily.    [provider]  Omega-3 Fatty Acids (FISH OIL) 1000 MG CAPS Take by mouth.    [provider]  pravastatin (PRAVACHOL) 20 MG tablet Take 20 mg by mouth at bedtime.     [provider]  sertraline (ZOLOFT) 50 MG tablet Take 50 mg by mouth at bedtime.     [provider]  valACYclovir (VALTREX) 1000 MG tablet Take 1 tablet (1,000 mg total) by mouth 3 (three) times daily. 12/18/18   Gerhard Munch, MD  VITAMIN D PO Take 1 tablet by mouth daily.    [provider]    Allergies    Percocet [oxycodone-acetaminophen]  Review of Systems   Review of Systems  Constitutional: Negative for fever.  Eyes: Negative for visual disturbance.  Respiratory: Negative for shortness of breath.   Cardiovascular: Negative for chest pain.  Gastrointestinal: Negative for abdominal pain.  Genitourinary: Negative for pelvic pain.  Musculoskeletal: Negative for back pain.       Right leg issue  Skin: Negative for color change.  Neurological: Positive for numbness. Negative for weakness.    Physical Exam Updated Vital Signs BP 128/90 (BP Location: Right Arm)   Pulse 79   Temp 97.9 F (36.6 C) (Oral)   Resp 18   Ht 4\' 9"  (1.448 m)   Wt 98.9 kg   SpO2 100%   BMI 47.17 kg/m   Physical Exam Vitals and nursing note reviewed.  Constitutional:      General: She is not in acute distress.    Appearance: She is well-developed.  HENT:     Head: Normocephalic and atraumatic.  Eyes:     Conjunctiva/sclera: Conjunctivae normal.  Cardiovascular:     Rate and Rhythm:  Normal rate and regular rhythm.     Heart sounds: No murmur.  Pulmonary:     Effort: Pulmonary effort is normal. No respiratory distress.     Breath sounds: Normal breath sounds.  Abdominal:     Palpations: Abdomen is soft.     Tenderness: There is no abdominal tenderness.  Musculoskeletal:     Cervical back: Neck supple.  Skin:    General: Skin is warm and dry.  Neurological:     Mental Status: She is alert.     Comments: Mental Status:  Alert, thought content appropriate, able to give a coherent history. Speech fluent without evidence of aphasia. Able to follow 2 step commands without difficulty.  Motor:  Normal tone. 5/5 strength of the BLE major muscle groups including strong and equal grip strength and dorsiflexion/plantar flexion Sensory: reports decreased sensation to the RLE, however exam is inconsistent and she frequently tells me that she feels me touch the LLE when I am not touching it Gait: pt has steady gait but has her right leg completely straightened and will not bend knee when ambulating CV: 2+ DP pulses   Psychiatric:        Mood and Affect: Affect is flat.     ED Results / Procedures / Treatments   Labs (all labs ordered are listed, but only abnormal results are displayed) Labs Reviewed  CBG MONITORING, ED    EKG None  Radiology No results found.  Procedures Procedures (including critical care time)  Medications Ordered in ED Medications - No data to display  ED Course  I have reviewed the triage vital signs and the nursing notes.  Pertinent labs & imaging results that were available during my care of the patient were reviewed by me and considered in my medical decision making (see chart for details).    MDM Rules/Calculators/A&P                      49 year old female with a history of anemia, diabetes, heart murmur, hypertension, who presents the emergency department today for evaluation of a right leg complaint.  She denies that she has any  pain in her leg but she states that it "just stopped working "while she was at work today.  She states that she cannot bend her leg and that she cannot feel it.  She denies any back pain or other associated symptoms.  She was able to continue working today.  Patient's examination is benign.  She does not have any weakness on exam.  She is able to ambulate but ambulates without bending her right lower extremity.  She states that she has trouble bending her knee (not due to pain), however on exam she is able to range her knee without difficulty.  It is not red or swollen. She is reporting sensory changes to the RLE however exam is inconsistent and there seems to be a lack of effort with engaging in the exam.  Her blood sugar is normal.  Her exam is not consistent with a stroke, arterial injury.  I doubt DVT.  She does not have any back pain to suggest radiculopathy.  At this time, there is an unclear cause of her symptoms but she does not appear to have any emergent cause of symptoms.  on chart review it does appear that she has a history of conversion disorder and I question whether or not her symptoms today are related to this.  Will have her follow-up with neurology.  Referral was given.  Advised on return precautions.  Case was discussed with supervising physician, Dr. Silverio Lay who is in agreement with the plan.  Final Clinical Impression(s) / ED Diagnoses Final diagnoses:  None    Rx / DC Orders ED Discharge Orders    None       Karrie Meres, PA-C 12/02/19 1703    Charlynne Pander, MD 12/03/19 [phone]

## 2019-12-02 NOTE — ED Notes (Signed)
Pt verbalizes understanding of DC instructions. Pt belongings returned and is ambulatory out of ED.  

## 2019-12-05 ENCOUNTER — Other Ambulatory Visit: Payer: Self-pay

## 2019-12-05 ENCOUNTER — Ambulatory Visit (INDEPENDENT_AMBULATORY_CARE_PROVIDER_SITE_OTHER): Payer: Commercial Managed Care - PPO

## 2019-12-05 DIAGNOSIS — L501 Idiopathic urticaria: Secondary | ICD-10-CM | POA: Diagnosis not present

## 2019-12-05 DIAGNOSIS — L508 Other urticaria: Secondary | ICD-10-CM

## 2019-12-11 ENCOUNTER — Ambulatory Visit: Payer: Commercial Managed Care - PPO | Admitting: Obstetrics and Gynecology

## 2019-12-11 ENCOUNTER — Ambulatory Visit (INDEPENDENT_AMBULATORY_CARE_PROVIDER_SITE_OTHER): Payer: Commercial Managed Care - PPO | Admitting: Obstetrics and Gynecology

## 2019-12-11 ENCOUNTER — Encounter: Payer: Self-pay | Admitting: Obstetrics and Gynecology

## 2019-12-11 ENCOUNTER — Other Ambulatory Visit: Payer: Self-pay

## 2019-12-11 VITALS — BP 101/69 | HR 94 | Wt 213.0 lb

## 2019-12-11 DIAGNOSIS — N952 Postmenopausal atrophic vaginitis: Secondary | ICD-10-CM | POA: Diagnosis not present

## 2019-12-11 NOTE — Progress Notes (Signed)
Noticed dry blood when wipping

## 2019-12-11 NOTE — Progress Notes (Signed)
49 yo P3 s/p hysterectomy here for the evaluation of vaginal bleeding. Patient is sexually active occasionally and reports the presence of dark blood following intercourse. Patient states the se notices bleeding only following intercourse and when wiping. She denies any pelvic pain or abnormal discharge. Patient is without any other complaints  Past Medical History:  Diagnosis Date  . Anemia   . Angioedema of lips 03/22/2019  . Asthma   . Chronic urticaria 03/22/2019  . Diabetes mellitus without complication (HCC)   . Heart murmur   . History of uterine fibroid   . HSV-2 (herpes simplex virus 2) infection   . Hypertension   . Seasonal allergies    Past Surgical History:  Procedure Laterality Date  . CYSTECTOMY     hand and breast  . LAPAROSCOPIC ABDOMINAL EXPLORATION    . TOTAL ABDOMINAL HYSTERECTOMY     Family History  Problem Relation Age of Onset  . Diabetes Mother   . Congestive Heart Failure Mother   . Kidney failure Mother   . Hypertension Mother   . Asthma Mother   . Prostate cancer Father   . Hypertension Sister   . Diabetes Maternal Grandmother   . Congestive Heart Failure Maternal Grandmother   . Kidney failure Maternal Grandmother   . Alzheimer's disease Paternal Grandmother    Social History   Tobacco Use  . Smoking status: Never Smoker  . Smokeless tobacco: Never Used  Substance Use Topics  . Alcohol use: No  . Drug use: No   ROS See pertinent in HPI. All other systems reviewed and negative GENERAL: Well-developed, well-nourished female in no acute distress.  PELVIC: Normal external female genitalia. Vagina is pale pink and atrophic.  Normal discharge. No adnexal mass or tenderness. EXTREMITIES: No cyanosis, clubbing, or edema, 2+ distal pulses.  A/P 49 yo with vaginal atrophy - Suspect postcoital vaginal spotting due to vaginal atrophy - Encouraged patient to continue using premarin cream and to add water-based vaginal lubricant - RTC prn

## 2019-12-12 ENCOUNTER — Encounter: Payer: Self-pay | Admitting: Neurology

## 2019-12-12 ENCOUNTER — Ambulatory Visit (INDEPENDENT_AMBULATORY_CARE_PROVIDER_SITE_OTHER): Payer: Commercial Managed Care - PPO | Admitting: Neurology

## 2019-12-12 VITALS — BP 168/78 | HR 72 | Temp 97.4°F | Ht <= 58 in | Wt 214.0 lb

## 2019-12-12 DIAGNOSIS — R29898 Other symptoms and signs involving the musculoskeletal system: Secondary | ICD-10-CM

## 2019-12-12 DIAGNOSIS — R2689 Other abnormalities of gait and mobility: Secondary | ICD-10-CM | POA: Diagnosis not present

## 2019-12-12 NOTE — Progress Notes (Signed)
PATIENT: Shelley Mccoy DOB: April 17, 1971  Chief Complaint  Patient presents with  . Functional gait abnormality    She went to ED on 12/02/19 with concerns of right leg numbness and the inability to bend her leg. The same symptoms happened again on 12/04/19 that lasted several hours. States a similar event occurred about two years ago and she went through PT which helped.  Marland Kitchen PCP    Hine-Henderson, Izora Gala, MD (referred from ED).     HISTORICAL  Shelley Mccoy is a 49 years old female, seen in request by Dr. Izora Gala Hine-Henderson for evaluation of gait abnormality, initial evaluation was on December 12, 2019.  I have reviewed and summarized the referring note from the referring physician.  She has past medical history of pretension, depression, working as a Quarry manager at [employer], since 2019, she had recurrent episode of gait abnormality, presented to emergency room multiple times for similar symptoms, she did reported a history of right Bell's palsy, with mild residual right facial asymmetry,  Most recent emergency room visit was on Saturday, December 02, 2019, she went to work at her usual time on that weekend 645, by 8 AM, her coworker noted she was dragging her right leg, only aft being reminded by her coworker, herself noticed when she walks around, she tends to circumferential with her right leg, she denies sensory loss, denies significant pain, denies left leg involvement, denies right face, or right arm weakness  She was able to continue work as a Quarry manager until 2 PM, then she was taken by her coworker to emergency room, because multiple similar presentations in the past, there was described functional component, she has variable effort on examination, and she began to have improvement of her right leg difficulty, walking difficulty by then.  She was discharged home few hours later.  She is supposed to work Sunday, she has to take that weekend off, by Tuesday, her symptoms has improved back to normal  baseline.  She was able to go back to work following Wednesday on December 05, 2019.  On today's examination, she was noted to have mild right facial asymmetry, shallow right nasolabial folder, synergistic movement  I reviewed multiple emergency room presentation, December 18, 2018, sudden onset facial asymmetry, difficulty talking, this happened at her work, she did have a history of right Bell's palsy in the past,  January 21, 2018, aphasia, right facial droop, happened at [facility] discharge summary on November 09, 2017, she presented with right-sided facial droop, aphasia,  I personally reviewed extensive previous imaging study, MRI of the brain on November 08, 2017, that was normal, CT angiogram of head and neck was normal CT head December 18, 2018 was normal  Echocardiogram November 09, 2017 ejection fraction 60 to 65%, no acute abnormality   Laboratory evaluation since 2020 showed normal ESR, C-reactive protein, thyroid functional test, ANA, C3, C4, hepatitis B surface antigen, hepatitis C, HIV, RPR,   REVIEW OF SYSTEMS: Full 14 system review of systems performed and notable only for as above All other review of systems were negative.  ALLERGIES: Allergies  Allergen Reactions  . Percocet [Oxycodone-Acetaminophen] Hives    HOME MEDICATIONS: Current Outpatient Medications  Medication Sig Dispense Refill  . albuterol (VENTOLIN HFA) 108 (90 Base) MCG/ACT inhaler Inhale into the lungs every 6 (six) hours as needed for wheezing or shortness of breath.     . Ascorbic Acid (VITAMIN C PO) Take 1 tablet by mouth daily.    Marland Kitchen  cetirizine (ZYRTEC ALLERGY) 10 MG tablet Take 1 tablet (10 mg total) by mouth 2 (two) times daily. 60 tablet 3  . conjugated estrogens (PREMARIN) vaginal cream Place 1 Applicatorful vaginally daily. 42.5 g 12  . EPINEPHrine 0.3 mg/0.3 mL IJ SOAJ injection     . famotidine (PEPCID) 20 MG tablet Take 1 tablet (20 mg total) by mouth 2 (two) times daily. 60 tablet  5  . Ferrous Sulfate (IRON PO) Take 1 tablet by mouth daily.    . fluticasone-salmeterol (ADVAIR HFA) 115-21 MCG/ACT inhaler Inhale 2 puffs into the lungs 2 (two) times daily as needed (for flares).     . gabapentin (NEURONTIN) 300 MG capsule Take 300 mg by mouth daily.     . hydrochlorothiazide (HYDRODIURIL) 12.5 MG tablet Take 12.5 mg by mouth daily.    . insulin glargine (LANTUS) 100 UNIT/ML injection Inject 18 Units into the skin at bedtime.     Marland Kitchen ipratropium (ATROVENT) 0.03 % nasal spray Place 2 sprays into both nostrils 2 (two) times daily.    Marland Kitchen losartan (COZAAR) 50 MG tablet Take 50 mg by mouth daily.    . Omega-3 Fatty Acids (FISH OIL) 1000 MG CAPS Take by mouth.    . pravastatin (PRAVACHOL) 20 MG tablet Take 20 mg by mouth at bedtime.     . sertraline (ZOLOFT) 50 MG tablet Take 50 mg by mouth at bedtime.     . valACYclovir (VALTREX) 1000 MG tablet Take 1 tablet (1,000 mg total) by mouth 3 (three) times daily. 21 tablet 0  . VITAMIN D PO Take 1 tablet by mouth daily.     Current Facility-Administered Medications  Medication Dose Route Frequency Provider Last Rate Last Admin  . omalizumab Arvid Right) injection 300 mg  300 mg Subcutaneous Q28 days Valentina Shaggy, MD   300 mg at 12/05/19 1125    PAST MEDICAL HISTORY: Past Medical History:  Diagnosis Date  . Anemia   . Angioedema of lips 03/22/2019  . Asthma   . Chronic urticaria 03/22/2019  . Conversion disorder   . Diabetes mellitus without complication ([place])   . Functional gait abnormality   . Heart murmur   . History of uterine fibroid   . HSV-2 (herpes simplex virus 2) infection   . Hypertension   . Seasonal allergies     PAST SURGICAL HISTORY: Past Surgical History:  Procedure Laterality Date  . CYSTECTOMY     hand and breast  . LAPAROSCOPIC ABDOMINAL EXPLORATION    . TOTAL ABDOMINAL HYSTERECTOMY      FAMILY HISTORY: Family History  Problem Relation Age of Onset  . Diabetes Mother   . Congestive Heart  Failure Mother   . Kidney failure Mother   . Hypertension Mother   . Asthma Mother   . Prostate cancer Father   . Hypertension Sister   . Diabetes Maternal Grandmother   . Congestive Heart Failure Maternal Grandmother   . Kidney failure Maternal Grandmother   . Alzheimer's disease Paternal Grandmother     SOCIAL HISTORY: Social History   Socioeconomic History  . Marital status: Single    Spouse name: Not on file  . Number of children: 3  . Years of education: college  . Highest education level: Associate degree: academic program  Occupational History  . Occupation: CNA  Tobacco Use  . Smoking status: Never Smoker  . Smokeless tobacco: Never Used  Substance and Sexual Activity  . Alcohol use: No  . Drug use: No  .  Sexual activity: Yes    Birth control/protection: Surgical  Other Topics Concern  . Not on file  Social History Narrative   Lives alone.   Right-handed.   No daily use of caffeine.   Social Determinants of Health   Financial Resource Strain:   . Difficulty of Paying Living Expenses:   Food Insecurity:   . Worried About Charity fundraiser in the Last Year:   . Arboriculturist in the Last Year:   Transportation Needs:   . Film/video editor (Medical):   Marland Kitchen Lack of Transportation (Non-Medical):   Physical Activity:   . Days of Exercise per Week:   . Minutes of Exercise per Session:   Stress:   . Feeling of Stress :   Social Connections:   . Frequency of Communication with Friends and Family:   . Frequency of Social Gatherings with Friends and Family:   . Attends Religious Services:   . Active Member of Clubs or Organizations:   . Attends Archivist Meetings:   Marland Kitchen Marital Status:   Intimate Partner Violence:   . Fear of Current or Ex-Partner:   . Emotionally Abused:   Marland Kitchen Physically Abused:   . Sexually Abused:      PHYSICAL EXAM   Vitals:   12/12/19 1059  BP: (!) 168/78  Pulse: 72  Temp: (!) 97.4 F (36.3 C)  Weight: 214 lb  (97.1 kg)  Height: '4\' 9"'$  (1.448 m)    Not recorded      Body mass index is 46.31 kg/m.  PHYSICAL EXAMNIATION:  Gen: NAD, conversant, well nourised, well groomed                     Cardiovascular: Regular rate rhythm, no peripheral edema, warm, nontender. Eyes: Conjunctivae clear without exudates or hemorrhage Neck: Supple, no carotid bruits. Pulmonary: Clear to auscultation bilaterally   NEUROLOGICAL EXAM:  MENTAL STATUS: Speech:    Speech is normal; fluent and spontaneous with normal comprehension.  Cognition:     Orientation to time, place and person     Normal recent and remote memory     Normal Attention span and concentration     Normal Language, naming, repeating,spontaneous speech     Fund of knowledge   CRANIAL NERVES: CN II: Visual fields are full to confrontation. Pupils are round equal and briskly reactive to light. CN III, IV, VI: extraocular movement are normal. No ptosis. CN V: Facial sensation is intact to light touch CN VII: Face is symmetric with normal eye closure  CN VIII: Hearing is normal to causal conversation. CN IX, X: Phonation is normal. CN XI: Head turning and shoulder shrug are intact  MOTOR: There is no pronator drift of out-stretched arms. Muscle bulk and tone are normal. Muscle strength is normal.  REFLEXES: Reflexes are 2+ and symmetric at the biceps, triceps, knees, and ankles. Plantar responses are flexor.  SENSORY: Intact to light touch, pinprick and vibratory sensation are intact in fingers and toes.  COORDINATION: There is no trunk or limb dysmetria noted.  GAIT/STANCE: She is able to get up from seated position arms crossed. Gait is steady with normal steps, base, arm swing, and turning. Heel and toe walking are normal. Tandem gait is normal.  Romberg is absent.   DIAGNOSTIC DATA (LABS, IMAGING, TESTING) - I reviewed patient records, labs, notes, testing and imaging myself where available.   ASSESSMENT AND  PLAN  Kaiyla Stahly is a 49 y.o.  female   Recurrent episode of right lower extremity weakness, gait abnormality,  Multiple similar presentation in the past,  There was described functional component at emergency room visit,  She insisted on further evaluations,  Proceed with EEG, MRI of brain   Marcial Pacas, M.D. Ph.D.  [facility] [address], [place], [place] [address] Ph: [phone] Fax: [phone]  CC: Hine-Henderson, Izora Gala, MD

## 2019-12-20 ENCOUNTER — Telehealth: Payer: Self-pay | Admitting: Neurology

## 2019-12-20 NOTE — Telephone Encounter (Signed)
LVM for pt to call back about scheduling [facility] Auth: [id number] (exp. 12/20/19 to 01/20/20)

## 2019-12-21 NOTE — Telephone Encounter (Signed)
Pt returned miss call

## 2019-12-21 NOTE — Telephone Encounter (Signed)
Pt called back wanting to schedule her MRI. Please call back when available.

## 2019-12-25 NOTE — Telephone Encounter (Signed)
I left a voicemail for patient to call back about scheduling mri.  °

## 2020-01-02 ENCOUNTER — Ambulatory Visit: Payer: Commercial Managed Care - PPO | Admitting: Neurology

## 2020-01-02 ENCOUNTER — Ambulatory Visit (INDEPENDENT_AMBULATORY_CARE_PROVIDER_SITE_OTHER): Payer: Commercial Managed Care - PPO

## 2020-01-02 ENCOUNTER — Other Ambulatory Visit: Payer: Self-pay

## 2020-01-02 DIAGNOSIS — L508 Other urticaria: Secondary | ICD-10-CM

## 2020-01-02 DIAGNOSIS — L501 Idiopathic urticaria: Secondary | ICD-10-CM | POA: Diagnosis not present

## 2020-01-02 NOTE — Telephone Encounter (Signed)
I spoke to the patient she is scheduled at [facility] for 01/10/20.

## 2020-01-03 ENCOUNTER — Ambulatory Visit: Payer: Commercial Managed Care - PPO | Admitting: Obstetrics and Gynecology

## 2020-01-03 ENCOUNTER — Ambulatory Visit (INDEPENDENT_AMBULATORY_CARE_PROVIDER_SITE_OTHER): Payer: Commercial Managed Care - PPO | Admitting: Neurology

## 2020-01-03 DIAGNOSIS — R252 Cramp and spasm: Secondary | ICD-10-CM | POA: Diagnosis not present

## 2020-01-03 DIAGNOSIS — R29898 Other symptoms and signs involving the musculoskeletal system: Secondary | ICD-10-CM

## 2020-01-03 DIAGNOSIS — R2689 Other abnormalities of gait and mobility: Secondary | ICD-10-CM

## 2020-01-10 ENCOUNTER — Other Ambulatory Visit: Payer: Self-pay

## 2020-01-10 ENCOUNTER — Ambulatory Visit: Payer: Commercial Managed Care - PPO

## 2020-01-10 DIAGNOSIS — R29898 Other symptoms and signs involving the musculoskeletal system: Secondary | ICD-10-CM

## 2020-01-10 DIAGNOSIS — R2689 Other abnormalities of gait and mobility: Secondary | ICD-10-CM

## 2020-01-16 NOTE — Procedures (Signed)
   HISTORY: 49 years old female, presented with frequent recurrent episode of right-sided weakness  TECHNIQUE:  This is a routine 16 channel EEG recording with one channel devoted to a limited EKG recording.  It was performed during wakefulness, drowsiness and asleep.  Hyperventilation and photic stimulation were performed as activating procedures.  There are minimum muscle and movement artifact noted.  Upon maximum arousal, posterior dominant waking rhythm consistent of mildly dysrhythmic theta range activity. Activities are symmetric over the bilateral posterior derivations and attenuated with eye opening.  Hyperventilation produced mild/moderate buildup with higher amplitude and the slower activities noted.  Photic stimulation did not alter the tracing.  During EEG recording, patient quickly drifted into drowsiness, and then entered stage II sleep, sleep EEG demonstrated architecture, there were frontal centrally dominant vertex waves and symmetric sleep spindles noted.  During EEG recording, there was no epileptiform discharge noted.  EKG demonstrate sinus rhythm, with heart rate of 72 bpm  CONCLUSION: This is a mild abnormal awake and asleep EEG.  There is evidence of mild background slowing, indicating mild bihemispheric malfunction, common etiology metabolic toxic.  There is no evidence of epileptiform discharge.  Levert Feinstein, M.D. Ph.D.  [facility] [address] [place], [place] [address] Phone: [phone] Fax:      [phone]

## 2020-01-30 ENCOUNTER — Ambulatory Visit (INDEPENDENT_AMBULATORY_CARE_PROVIDER_SITE_OTHER): Payer: Commercial Managed Care - PPO

## 2020-01-30 ENCOUNTER — Ambulatory Visit: Payer: Self-pay

## 2020-01-30 ENCOUNTER — Other Ambulatory Visit: Payer: Self-pay

## 2020-01-30 DIAGNOSIS — L508 Other urticaria: Secondary | ICD-10-CM

## 2020-01-30 DIAGNOSIS — L501 Idiopathic urticaria: Secondary | ICD-10-CM

## 2020-02-02 ENCOUNTER — Other Ambulatory Visit: Payer: Self-pay

## 2020-02-02 ENCOUNTER — Ambulatory Visit (INDEPENDENT_AMBULATORY_CARE_PROVIDER_SITE_OTHER): Payer: Commercial Managed Care - PPO | Admitting: Obstetrics and Gynecology

## 2020-02-02 ENCOUNTER — Encounter: Payer: Self-pay | Admitting: Obstetrics and Gynecology

## 2020-02-02 DIAGNOSIS — R1907 Generalized intra-abdominal and pelvic swelling, mass and lump: Secondary | ICD-10-CM

## 2020-02-02 DIAGNOSIS — R1032 Left lower quadrant pain: Secondary | ICD-10-CM | POA: Diagnosis not present

## 2020-02-02 NOTE — Progress Notes (Signed)
Pt is in the office for follow up visit after ED on 01-31-20.

## 2020-02-02 NOTE — Progress Notes (Signed)
49 yo presenting today as an ED follow up. Patient was seen in the ED on 5/5 due to lower quadrant pain. A CT scan was performed to rule out diverticulitis and incidental findings of non-specific nodules on vaginal cuff and left side of pelvis were noted. Patient with previous abdominal hysterectomy for fibroid uterus- no hystery of uterine/cervical cancer or abnormal pap smear. Patient reports some improvement in her LLQ pain but some persistent nausea. She admits to a decrease in appetite. She reports regular bowel movements.   Past Medical History:  Diagnosis Date  . Anemia   . Angioedema of lips 03/22/2019  . Asthma   . Chronic urticaria 03/22/2019  . Conversion disorder   . Diabetes mellitus without complication (HCC)   . Functional gait abnormality   . Heart murmur   . History of uterine fibroid   . HSV-2 (herpes simplex virus 2) infection   . Hypertension   . Seasonal allergies    Past Surgical History:  Procedure Laterality Date  . CYSTECTOMY     hand and breast  . LAPAROSCOPIC ABDOMINAL EXPLORATION    . TOTAL ABDOMINAL HYSTERECTOMY     Family History  Problem Relation Age of Onset  . Diabetes Mother   . Congestive Heart Failure Mother   . Kidney failure Mother   . Hypertension Mother   . Asthma Mother   . Prostate cancer Father   . Hypertension Sister   . Diabetes Maternal Grandmother   . Congestive Heart Failure Maternal Grandmother   . Kidney failure Maternal Grandmother   . Alzheimer's disease Paternal Grandmother    Social History   Tobacco Use  . Smoking status: Never Smoker  . Smokeless tobacco: Never Used  Substance Use Topics  . Alcohol use: No  . Drug use: No   ROS See pertinent in HPI. All other systems reviewed and negative  Blood pressure 140/89, pulse 70, weight 220 lb 4.8 oz (99.9 kg). GENERAL: Well-developed, well-nourished female in no acute distress.  HEENT: Normocephalic, atraumatic. Sclerae anicteric.  NECK: Supple. Normal thyroid.   LUNGS: Clear to auscultation bilaterally.  HEART: Regular rate and rhythm. BREASTS: Symmetric in size. No palpable masses or lymphadenopathy, skin changes, or nipple drainage. ABDOMEN: Soft, nontender, nondistended. No organomegaly. PELVIC: Normal external female genitalia. Vagina is pink and rugated.  Normal discharge. Normal appearing cervix. Uterus is normal in size.  No adnexal mass or tenderness. EXTREMITIES: No cyanosis, clubbing, or edema, 2+ distal pulses.  01/31/2020  EXAM: CT ABDOMEN AND PELVIS WITH CONTRAST  TECHNIQUE: Multidetector CT imaging of the abdomen and pelvis was performed using the standard protocol following bolus administration of intravenous contrast.  CONTRAST:  85 cc Omnipaque 350  COMPARISON:  None.  FINDINGS: Lower chest: No acute abnormality.  Hepatobiliary: No focal liver abnormality is seen. No gallstones, gallbladder wall thickening, or biliary dilatation.  Pancreas: Unremarkable. No pancreatic ductal dilatation or surrounding inflammatory changes.  Spleen: Normal in size without focal abnormality.  Adrenals/Urinary Tract: Normal appearance of the adrenal glands.  The kidneys are unremarkable. No kidney stone, mass or hydronephrosis identified. The urinary bladder is unremarkable.  Stomach/Bowel: Stomach is nondistended. The small bowel loops have a normal course and caliber without evidence for bowel obstruction. The appendix is visualized and appears normal. Postsurgical changes involving pelvic small bowel loops and sigmoid colon identified. No pathologic dilatation of the colon. No focal inflammatory changes identified.  Vascular/Lymphatic: Normal appearance of the abdominal aorta. No aneurysm. No abdominopelvic adenopathy identified.  Reproductive: Status post  hysterectomy. There is a focal area of increased soft tissue along the left side of vaginal cuff which is nonspecific measuring 1.9 cm, image 71/2. This appears  closely associated with the proximal rectum, image 69/2.  Other: Within the left anterior pelvis there is a second area of increased soft tissue measuring 1.3 cm, image 72/2. Appears closely associated with the anterior aspect of the sigmoid colon.  Musculoskeletal: No acute or significant osseous findings.  IMPRESSION: 1. No acute findings within the abdomen or pelvis. No evidence for acute diverticulitis. 2. Postsurgical changes are identified within the pelvic bowel loops. There are several scratch set 2 foci of increased soft tissue nodularity are noted along the left side of vaginal cuff and in the left anterior pelvis along the anterior surface of sigmoid colon. These are nonspecific and may represent sequelae of postsurgical change. However, if there is a history of malignancy within the pelvis findings may be suspicious for residual or recurrence of tumor. Careful clinical correlation is advised. In the absence of known malignancy, recommend follow-up imaging in 3 months with repeat CT of the pelvis with IV and oral contrast material. Additionally, if there is more remote imaging of the pelvis, we would be happy to compare those with today's exam.   Electronically Signed   By: Kerby Moors M.D.   On: 01/31/2020 11:42  A/P 50 yp with LLQ pain likely secondary to scar tissue - Patient to pick up pain medication prescribed by ED on 5/5 - Repeat CT scan in 3 months - Patient referred to pelvic PT - Patient reports normal colonoscopy - May need GI follow up if pain persists

## 2020-02-05 ENCOUNTER — Other Ambulatory Visit (HOSPITAL_COMMUNITY): Payer: Self-pay

## 2020-02-27 ENCOUNTER — Ambulatory Visit: Payer: Self-pay

## 2020-03-01 ENCOUNTER — Ambulatory Visit (INDEPENDENT_AMBULATORY_CARE_PROVIDER_SITE_OTHER): Payer: Commercial Managed Care - PPO

## 2020-03-01 DIAGNOSIS — L501 Idiopathic urticaria: Secondary | ICD-10-CM

## 2020-03-25 ENCOUNTER — Ambulatory Visit: Payer: Commercial Managed Care - PPO | Attending: Obstetrics and Gynecology | Admitting: Physical Therapy

## 2020-03-25 ENCOUNTER — Other Ambulatory Visit: Payer: Self-pay

## 2020-03-25 ENCOUNTER — Encounter: Payer: Self-pay | Admitting: Physical Therapy

## 2020-03-25 DIAGNOSIS — M6281 Muscle weakness (generalized): Secondary | ICD-10-CM | POA: Diagnosis not present

## 2020-03-25 DIAGNOSIS — R1084 Generalized abdominal pain: Secondary | ICD-10-CM | POA: Diagnosis present

## 2020-03-25 DIAGNOSIS — L905 Scar conditions and fibrosis of skin: Secondary | ICD-10-CM | POA: Diagnosis present

## 2020-03-25 NOTE — Therapy (Addendum)
[facility] [address] [place], [place], [address] Phone: [phone]   Fax:  [phone]  Physical Therapy Evaluation  Patient Details  Name: Shelley Mccoy MRN: [id number] Date of Birth: 09-24-1971 Referring Provider (PT): Dr. Gigi Gin, Constant   Encounter Date: 03/25/2020   PT End of Session - 03/25/20 1654    Visit Number 1    Date for PT Re-Evaluation 06/17/20    Authorization Type united [employer]    PT Start Time 1615    PT Stop Time 1648    PT Time Calculation (min) 33 min    Activity Tolerance Patient tolerated treatment well;No increased pain    Behavior During Therapy WFL for tasks assessed/performed           Past Medical History:  Diagnosis Date  . Anemia   . Angioedema of lips 03/22/2019  . Asthma   . Chronic urticaria 03/22/2019  . Conversion disorder   . Diabetes mellitus without complication (HCC)   . Functional gait abnormality   . Heart murmur   . History of uterine fibroid   . HSV-2 (herpes simplex virus 2) infection   . Hypertension   . Seasonal allergies     Past Surgical History:  Procedure Laterality Date  . CYSTECTOMY     hand and breast  . LAPAROSCOPIC ABDOMINAL EXPLORATION    . TOTAL ABDOMINAL HYSTERECTOMY      There were no vitals filed for this visit.    Subjective Assessment - 03/25/20 1619    Subjective Patient has increased scar tissue in the abdomen and pain. Want to see if therapy will work before they consider surgery. Patient has had 3 c-section, hysterectomy, surgery to remove the cysts.    Patient Stated Goals reduce pain and scar tissue    Currently in Pain? Yes    Pain Score 7     Pain Location Pelvis    Pain Orientation Lower;Left;Right    Pain Descriptors / Indicators Dull;Constant    Pain Type Chronic pain    Pain Onset More than a month ago    Pain Frequency Intermittent    Aggravating Factors  not sure    Pain Relieving Factors not sure                [facility] PT Assessment - 03/25/20 0001      Assessment   Medical Diagnosis R19.07 Generalized intra-abdominal and pelvic swelling, mass and lump    Referring Provider (PT) Dr. Gigi Gin, Constant    Onset Date/Surgical Date --   chronic   Prior Therapy none      Precautions   Precautions None      Restrictions   Weight Bearing Restrictions No      Balance Screen   Has the patient fallen in the past 6 months No    Has the patient had a decrease in activity level because of a fear of falling?  No    Is the patient reluctant to leave their home because of a fear of falling?  No      Home Tourist information centre manager residence      Prior Function   Level of Independence Independent    Vocation Requirements walking, push wheelchairs, hoyer lifts, lifting      Cognition   Overall Cognitive Status Within Functional Limits for tasks assessed      Observation/Other Assessments   Skin Integrity c-section scar limited and painful, ofther abdominal scars are restricted  Posture/Postural Control   Posture/Postural Control Postural limitations      ROM / Strength   AROM / PROM / Strength AROM;PROM;Strength      AROM   Cervical Flexion full with trunk twist to right    Lumbar - Right Rotation decreased by 505    Lumbar - Left Rotation decreased by 50%      PROM   Right Hip Flexion 90   limited by abdominal pain and size   Left Hip Flexion 90   limited by abdominal pain and size     Strength   Right Hip Extension 2/5    Right Hip External Rotation  4/5    Right Hip Internal Rotation 4/5    Right Hip ABduction 3-/5    Right Hip ADduction 4-/5    Left Hip Extension 3/5    Left Hip External Rotation 4/5    Left Hip Internal Rotation 4/5    Left Hip ABduction 4/5    Left Hip ADduction 4/5      Palpation   Spinal mobility decreased lower rib cage mobitlily, decreased movement of T4-L5    SI assessment  right ilium is anteriorly rotated    Palpation  comment tenderness located throroghout the abdomen with the lower section worse, lumbar and gluteals, left levator ani                      Objective measurements completed on examination: See above findings.     Pelvic Floor Special Questions - 03/25/20 0001    Prior Pregnancies Yes    Number of Pregnancies 4    Number of C-Sections 3    Diastasis Recti 1 finger width along the diastasis    Currently Sexually Active Yes    Is this Painful No    Urinary Leakage No    Urinary urgency Yes    Fecal incontinence No            [facility] Adult PT Treatment/Exercise - 03/25/20 0001      Self-Care   Self-Care Other Self-Care Comments    Other Self-Care Comments  abdominal massage to reduce the pain of the abdomen and c-section scar massage      Lumbar Exercises: Supine   Ab Set 10 reps;5 seconds    Other Supine Lumbar Exercises diaphramatic breathing to expand the lower rib cage                  PT Education - 03/25/20 1653    Education Details Access Code: [email or web address]; c-section scar massage    Person(s) Educated Patient    Methods Explanation;Demonstration;Verbal cues;Handout    Comprehension Verbalized understanding            PT Short Term Goals - 03/25/20 1713      PT SHORT TERM GOAL #1   Title Pt will be independent with initial HEP    Time 4    Period Weeks    Status On-going    Target Date 04/22/20      PT SHORT TERM GOAL #2   Title understand how to perfrom scar massage to improve mobility    Baseline --    Time 4    Period Weeks    Status New    Target Date 04/22/20      PT SHORT TERM GOAL #3   Title able to expand her lower rib cage to move the diaphragm to reduce her pain >/= 25%    Time  4    Period Weeks    Status New    Target Date 04/22/20      PT SHORT TERM GOAL #4   Title --    Baseline --             PT Long Term Goals - 03/25/20 1715      PT LONG TERM GOAL #1   Title independent with advanced HEP    Baseline --      Time 12    Period Weeks    Status New    Target Date 06/17/20      PT LONG TERM GOAL #2   Title able to move in bed with pain decreased >/= 50% due to improve tissue mobitliy and engagement of the abdominal muscles    Baseline ---    Time 12    Period Weeks    Status New    Target Date 06/17/20      PT LONG TERM GOAL #3   Title able to perform her work tasks including lifting, pushing a wheelchair with </= 50% less pain due to improved mobilty and strength    Baseline --    Time 12    Period Weeks    Status New    Target Date 06/17/20      PT LONG TERM GOAL #4   Title walking for 30 minutes 3 times per week to improve her endurance and way she handles her pain    Baseline ---    Time 12    Period Weeks    Status New    Target Date 06/17/20      PT LONG TERM GOAL #5   Title --                  Plan - 03/25/20 1655    Clinical Impression Statement Patient is a 49 year old female with chronic abdominal pain and swelling. She has had multiple abdominal surgeries with scar tissue. Patient reports her abdominal pain is 7/10 throughout the abdomen with no certain activity that will increase or reduce. Patient abdomen strength is 1/5. Rib cage movement is reduced with  breath.  Patient hip flexion is limited due to abdominal size and pain. Decreased movement of the c-section scar and the laproscopic surgical sites. Decreased bilateral hip strength with pain. Decreased lumbar ROM. Patient will benefit from skilled therapy to reduce her pain, improve mobility and strength.    Personal Factors and Comorbidities Comorbidity 3+;Transportation;Fitness;Profession    Comorbidities Abdominal hysterectomy; diabetes; c-section x3; laproscopic surgery    Examination-Activity Limitations Bed Mobility;Locomotion Level;Bend;Caring for Others;Stand    Examination-Participation Restrictions Cleaning;Community Activity    Stability/Clinical Decision Making Evolving/Moderate complexity     Clinical Decision Making Moderate    Rehab Potential Good    PT Frequency 2x / week    PT Duration 12 weeks    PT Treatment/Interventions ADLs/Self Care Home Management;Cryotherapy;Electrical Stimulation;Moist Heat;Ultrasound;Neuromuscular re-education;Therapeutic exercise;Therapeutic activities;Patient/family education;Manual techniques;Dry needling;Scar mobilization;Taping;Spinal Manipulations    PT Next Visit Plan scar massage to adomen, stretches, pain control, dry needling, fascial release    PT Home Exercise Plan Access Code: [email or web address]    Consulted and Agree with Plan of Care Patient           Patient will benefit from skilled therapeutic intervention in order to improve the following deficits and impairments:  Decreased coordination, Decreased range of motion, Increased fascial restricitons, Decreased endurance, Decreased activity tolerance, Pain, Decreased mobility, Decreased strength  Visit Diagnosis:  Muscle weakness (generalized) - Plan: PT plan of care cert/re-cert  Generalized abdominal pain - Plan: PT plan of care cert/re-cert  Scar - Plan: PT plan of care cert/re-cert     Problem List Patient Active Problem List   Diagnosis Date Noted  . Transient right leg weakness 12/12/2019  . Moderate persistent asthma without complication 09/38/1829  . Seasonal and perennial allergic rhinoconjunctivitis 04/20/2019  . Chronic urticaria 03/22/2019  . Angioedema of lips 03/22/2019  . Other allergic rhinitis 03/22/2019  . Allergic conjunctivitis of both eyes 03/22/2019  . Morbid obesity with BMI of 40.0-44.9, adult ([place]) 12/01/2017  . Type 2 diabetes mellitus without complication, without long-term current use of insulin ([place]) 12/01/2017  . Essential hypertension 12/01/2017  . Hyperlipidemia LDL goal <100 12/01/2017  . 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 Asthma 01/12/2013  . GERD (gastroesophageal reflux disease) 01/12/2013  . Antral gastritis 07/01/2012    Earlie Counts,  PT 03/25/20 5:20 PM   [facility] [address], [place], [place], [address] Phone: [phone]   Fax:  [phone]  Name: Shelley Mccoy MRN: [id number] Date of Birth: May 03, 1971  PHYSICAL THERAPY DISCHARGE SUMMARY  Visits from Start of Care: 1  Current functional level related to goals / functional outcomes: See above. Patient has only attended her initial evaluation. Patient has no-showed for her last 2 visits. She is being discharged due to the attendance policy.    Remaining deficits: See above.    Education / Equipment: HEP Plan:                                                    Patient goals were not met. Patient is being discharged due to not returning since the last visit. Thank you for the referral. Earlie Counts, PT 05/20/20 8:28 AM   ?????

## 2020-03-25 NOTE — Patient Instructions (Signed)
Access Code: [email or web address] URL: [email or web address] Date: 03/25/2020 Prepared by: Eulis Foster  Exercises Supine Diaphragmatic Breathing - 2 x daily - 7 x weekly - 1 sets - 10 reps Supine Abdominal Wall Massage - 1 x daily - 7 x weekly - 1 sets - 10 reps Supine Transversus Abdominis Bracing - Hands on Stomach - 2 x daily - 7 x weekly - 1 sets - 10 reps c-section scar massage [facility] [address], [address] [place], [place] [address] Phone # [phone] Fax [phone]

## 2020-03-28 ENCOUNTER — Ambulatory Visit (INDEPENDENT_AMBULATORY_CARE_PROVIDER_SITE_OTHER): Payer: Commercial Managed Care - PPO

## 2020-03-28 ENCOUNTER — Other Ambulatory Visit: Payer: Self-pay

## 2020-03-28 DIAGNOSIS — J4540 Moderate persistent asthma, uncomplicated: Secondary | ICD-10-CM

## 2020-03-29 ENCOUNTER — Ambulatory Visit: Payer: Self-pay

## 2020-03-29 ENCOUNTER — Ambulatory Visit: Payer: Commercial Managed Care - PPO | Admitting: Physical Therapy

## 2020-04-25 ENCOUNTER — Ambulatory Visit: Payer: Self-pay

## 2020-04-29 ENCOUNTER — Other Ambulatory Visit: Payer: Self-pay | Admitting: Allergy

## 2020-04-30 ENCOUNTER — Telehealth: Payer: Self-pay

## 2020-04-30 NOTE — Telephone Encounter (Signed)
Please advise 

## 2020-04-30 NOTE — Telephone Encounter (Signed)
Received a message from [facility] that the pt is going to need lab prior to her CT scan.  If a return call back to [facility] be made at [phone].  Message routed to [facility].

## 2020-05-01 ENCOUNTER — Other Ambulatory Visit: Payer: Self-pay

## 2020-05-01 DIAGNOSIS — E119 Type 2 diabetes mellitus without complications: Secondary | ICD-10-CM

## 2020-05-02 ENCOUNTER — Encounter: Payer: Commercial Managed Care - PPO | Admitting: Physical Therapy

## 2020-05-06 ENCOUNTER — Other Ambulatory Visit: Payer: Self-pay | Admitting: *Deleted

## 2020-05-06 ENCOUNTER — Encounter (HOSPITAL_COMMUNITY): Payer: Self-pay

## 2020-05-06 ENCOUNTER — Ambulatory Visit (INDEPENDENT_AMBULATORY_CARE_PROVIDER_SITE_OTHER): Payer: Commercial Managed Care - PPO

## 2020-05-06 ENCOUNTER — Other Ambulatory Visit: Payer: Self-pay

## 2020-05-06 ENCOUNTER — Ambulatory Visit (HOSPITAL_COMMUNITY): Admission: RE | Admit: 2020-05-06 | Payer: Commercial Managed Care - PPO | Source: Ambulatory Visit

## 2020-05-06 DIAGNOSIS — R102 Pelvic and perineal pain: Secondary | ICD-10-CM

## 2020-05-06 DIAGNOSIS — L501 Idiopathic urticaria: Secondary | ICD-10-CM | POA: Diagnosis not present

## 2020-05-06 DIAGNOSIS — L508 Other urticaria: Secondary | ICD-10-CM

## 2020-05-06 NOTE — Progress Notes (Signed)
Call received from [facility] @ [facility] dept. She stated that pt had appt today however had not ingested the contrast liquid and therefore the appt has been rescheduled to tomorrow. Also, she stated that pt had been ordered to have CT Abdomen and pelvis w and wo contrast. This needs to be changed and a new order placed for CT Abdomen and pelvis w contrast. Per chart review, I observed that pt receives care @ [facility]. In order to expedite pt's care I placed the order as requested.

## 2020-05-07 ENCOUNTER — Ambulatory Visit (HOSPITAL_COMMUNITY)
Admission: RE | Admit: 2020-05-07 | Discharge: 2020-05-07 | Disposition: A | Discharge status: Home / Self Care | Payer: Commercial Managed Care - PPO | Source: Ambulatory Visit | Attending: Obstetrics and Gynecology | Admitting: Obstetrics and Gynecology

## 2020-05-07 ENCOUNTER — Encounter (HOSPITAL_COMMUNITY): Payer: Self-pay

## 2020-05-07 ENCOUNTER — Other Ambulatory Visit: Payer: Self-pay

## 2020-05-07 ENCOUNTER — Telehealth: Payer: Self-pay

## 2020-05-07 ENCOUNTER — Ambulatory Visit (HOSPITAL_COMMUNITY): Payer: Commercial Managed Care - PPO

## 2020-05-07 DIAGNOSIS — R102 Pelvic and perineal pain: Secondary | ICD-10-CM | POA: Diagnosis not present

## 2020-05-07 LAB — POCT I-STAT CREATININE: Creatinine, Ser: 0.9 mg/dL (ref 0.44–1.00)

## 2020-05-07 MED ORDER — SODIUM CHLORIDE (PF) 0.9 % IJ SOLN
INTRAMUSCULAR | Status: AC
  Filled 2020-05-07: qty 50

## 2020-05-07 MED ORDER — IOHEXOL 300 MG/ML  SOLN
100.0000 mL | Freq: Once | INTRAMUSCULAR | Status: AC | PRN
  Administered 2020-05-07: 100 mL via INTRAVENOUS

## 2020-05-07 NOTE — Telephone Encounter (Signed)
Called to advise of u/s results, no answer, left vm

## 2020-05-08 ENCOUNTER — Telehealth: Payer: Self-pay

## 2020-05-08 NOTE — Telephone Encounter (Signed)
Returned call and advised of U/S results, pt stated that she has been seeing GI, but does not think that she will continue.

## 2020-05-08 NOTE — Telephone Encounter (Signed)
Returned call, no answer, left vm 

## 2020-05-11 ENCOUNTER — Other Ambulatory Visit (HOSPITAL_COMMUNITY): Payer: Self-pay

## 2020-05-16 ENCOUNTER — Ambulatory Visit: Payer: Commercial Managed Care - PPO | Attending: Obstetrics and Gynecology | Admitting: Physical Therapy

## 2020-05-16 ENCOUNTER — Telehealth: Payer: Self-pay | Admitting: Physical Therapy

## 2020-05-16 NOTE — Telephone Encounter (Signed)
Called patient about her no-show appointment today at 11:45. Left a message.  Eulis Foster, PT @8 /19/2021@ 12:09 PM

## 2020-05-20 ENCOUNTER — Telehealth: Payer: Self-pay | Admitting: Physical Therapy

## 2020-05-20 ENCOUNTER — Ambulatory Visit: Payer: Commercial Managed Care - PPO | Admitting: Physical Therapy

## 2020-05-20 NOTE — Telephone Encounter (Signed)
Called patient due to her no-show for her 8:00 appointment. Left message.  Eulis Foster, PT @8 /23/2021@ 8:31 AM

## 2020-05-29 ENCOUNTER — Encounter: Payer: Commercial Managed Care - PPO | Admitting: Physical Therapy

## 2020-06-04 ENCOUNTER — Other Ambulatory Visit: Payer: Self-pay

## 2020-06-04 ENCOUNTER — Ambulatory Visit (INDEPENDENT_AMBULATORY_CARE_PROVIDER_SITE_OTHER): Payer: 59

## 2020-06-04 DIAGNOSIS — L501 Idiopathic urticaria: Secondary | ICD-10-CM

## 2020-06-17 ENCOUNTER — Encounter: Payer: Commercial Managed Care - PPO | Admitting: Physical Therapy

## 2020-06-24 ENCOUNTER — Other Ambulatory Visit (HOSPITAL_COMMUNITY): Payer: Self-pay

## 2020-06-24 DIAGNOSIS — E669 Obesity, unspecified: Secondary | ICD-10-CM | POA: Diagnosis not present

## 2020-06-24 DIAGNOSIS — F329 Major depressive disorder, single episode, unspecified: Secondary | ICD-10-CM | POA: Diagnosis not present

## 2020-06-24 DIAGNOSIS — M2550 Pain in unspecified joint: Secondary | ICD-10-CM | POA: Diagnosis not present

## 2020-06-24 DIAGNOSIS — B009 Herpesviral infection, unspecified: Secondary | ICD-10-CM | POA: Diagnosis not present

## 2020-06-24 DIAGNOSIS — E785 Hyperlipidemia, unspecified: Secondary | ICD-10-CM | POA: Diagnosis not present

## 2020-06-24 DIAGNOSIS — I739 Peripheral vascular disease, unspecified: Secondary | ICD-10-CM | POA: Diagnosis not present

## 2020-06-24 DIAGNOSIS — Z1159 Encounter for screening for other viral diseases: Secondary | ICD-10-CM | POA: Diagnosis not present

## 2020-06-24 DIAGNOSIS — Z23 Encounter for immunization: Secondary | ICD-10-CM | POA: Diagnosis not present

## 2020-06-24 DIAGNOSIS — E119 Type 2 diabetes mellitus without complications: Secondary | ICD-10-CM | POA: Diagnosis not present

## 2020-06-24 DIAGNOSIS — I10 Essential (primary) hypertension: Secondary | ICD-10-CM | POA: Diagnosis not present

## 2020-06-26 ENCOUNTER — Other Ambulatory Visit: Payer: Self-pay

## 2020-06-26 ENCOUNTER — Encounter: Payer: Commercial Managed Care - PPO | Admitting: Physical Therapy

## 2020-06-26 DIAGNOSIS — I739 Peripheral vascular disease, unspecified: Secondary | ICD-10-CM

## 2020-06-28 ENCOUNTER — Ambulatory Visit
Admission: RE | Admit: 2020-06-28 | Discharge: 2020-06-28 | Disposition: A | Discharge status: Home / Self Care | Payer: Commercial Managed Care - PPO | Source: Ambulatory Visit

## 2020-06-28 DIAGNOSIS — I70213 Atherosclerosis of native arteries of extremities with intermittent claudication, bilateral legs: Secondary | ICD-10-CM | POA: Diagnosis not present

## 2020-06-28 DIAGNOSIS — I739 Peripheral vascular disease, unspecified: Secondary | ICD-10-CM

## 2020-07-02 ENCOUNTER — Telehealth: Payer: Self-pay | Admitting: *Deleted

## 2020-07-02 ENCOUNTER — Ambulatory Visit: Payer: Self-pay

## 2020-07-02 NOTE — Telephone Encounter (Signed)
L/m for patient that her Ins change prevented Xolair from shipping and she will need appt with MD to get approval thru new Ins and script to [facility] pharmacy to now fill

## 2020-07-05 ENCOUNTER — Ambulatory Visit: Payer: 59 | Admitting: Allergy

## 2020-07-05 ENCOUNTER — Other Ambulatory Visit: Payer: Commercial Managed Care - PPO

## 2020-07-25 ENCOUNTER — Ambulatory Visit (INDEPENDENT_AMBULATORY_CARE_PROVIDER_SITE_OTHER): Payer: 59 | Admitting: Allergy

## 2020-07-25 ENCOUNTER — Telehealth: Payer: Self-pay | Admitting: *Deleted

## 2020-07-25 ENCOUNTER — Other Ambulatory Visit: Payer: Self-pay

## 2020-07-25 ENCOUNTER — Other Ambulatory Visit (HOSPITAL_COMMUNITY): Payer: Self-pay | Admitting: Allergy

## 2020-07-25 ENCOUNTER — Encounter: Payer: Self-pay | Admitting: Allergy

## 2020-07-25 VITALS — BP 134/78 | HR 71 | Temp 97.8°F | Resp 18 | Ht 60.0 in | Wt 220.6 lb

## 2020-07-25 DIAGNOSIS — L501 Idiopathic urticaria: Secondary | ICD-10-CM

## 2020-07-25 DIAGNOSIS — H1013 Acute atopic conjunctivitis, bilateral: Secondary | ICD-10-CM

## 2020-07-25 DIAGNOSIS — L508 Other urticaria: Secondary | ICD-10-CM

## 2020-07-25 DIAGNOSIS — J4540 Moderate persistent asthma, uncomplicated: Secondary | ICD-10-CM | POA: Diagnosis not present

## 2020-07-25 DIAGNOSIS — J3089 Other allergic rhinitis: Secondary | ICD-10-CM | POA: Diagnosis not present

## 2020-07-25 MED ORDER — FLUTICASONE-SALMETEROL 115-21 MCG/ACT IN AERO: 2.0000 | INHALATION_SPRAY | Freq: Two times a day (BID) | RESPIRATORY_TRACT | 5 refills | Status: DC | PRN

## 2020-07-25 MED ORDER — EPINEPHRINE 0.3 MG/0.3ML IJ SOAJ: 0.3000 mg | Freq: Once | INTRAMUSCULAR | 1 refills | Status: AC

## 2020-07-25 MED ORDER — FLUTICASONE PROPIONATE 50 MCG/ACT NA SUSP: NASAL | 5 refills | Status: DC

## 2020-07-25 MED ORDER — ALBUTEROL SULFATE HFA 108 (90 BASE) MCG/ACT IN AERS: 2.0000 | INHALATION_SPRAY | Freq: Four times a day (QID) | RESPIRATORY_TRACT | 1 refills | Status: DC | PRN

## 2020-07-25 MED ORDER — FAMOTIDINE 20 MG PO TABS: 20.0000 mg | ORAL_TABLET | Freq: Two times a day (BID) | ORAL | 5 refills | Status: DC

## 2020-07-25 MED ORDER — CETIRIZINE HCL 10 MG PO TABS: 10.0000 mg | ORAL_TABLET | Freq: Two times a day (BID) | ORAL | 5 refills | Status: DC

## 2020-07-25 MED FILL — FLUTICASONE PROP 50 MCG SPR: 50 | 30 days supply | Qty: 16 | Fill #0

## 2020-07-25 MED FILL — ALBUTEROL SULFATE HFA 108 (: 108 (90 BAS | 25 days supply | Qty: 18 | Fill #0

## 2020-07-25 MED FILL — FAMOTIDINE 20 MG TABS: 20 | 30 days supply | Qty: 60 | Fill #0

## 2020-07-25 MED FILL — EPINEPHRINE 0.3 MG AUTO-INJ: 0.3 | 2 days supply | Qty: 2 | Fill #0

## 2020-07-25 NOTE — Telephone Encounter (Signed)
Patient has been seen today for an OV with Dr. Delorse Lek and received a sample dose of Xolair. Please advise for new Rx for Xolair.

## 2020-07-25 NOTE — Telephone Encounter (Signed)
error 

## 2020-07-25 NOTE — Patient Instructions (Signed)
Chronic urticaria  Continue Xolair injections 300mg  every 4 weeks.  Sample provided today to get back on track with monthly dosing.   You should have your own supply for Novembers dose.   Continue zyrtec (cetirizine) 10mg  and pepcid (famotidine) 20mg  twice a day.   Monitor symptoms.   Avoid the following potential triggers: alcohol, tight clothing, NSAIDs.  Moderate persistent asthma without complication  Daily controller medication(s):continue Advair 2 puffs twice a day with spacer and rinse mouth afterwards.  Prior to physical activity:May use albuterol rescue inhaler 2 puffs 5 to 15 minutes prior to strenuous physical activities.  Rescue medications:May use albuterol rescue inhaler 2 puffs or nebulizer every 4 to 6 hours as needed for shortness of breath, chest tightness, coughing, and wheezing. Monitor frequency of use. Asthma control goals:  Full participation in all desired activities (may need albuterol before activity) Albuterol use two times or less a week on average (not counting use with activity) Cough interfering with sleep two times or less a month Oral steroids no more than once a year No hospitalizations  Seasonal and perennial allergic rhinoconjunctivitis 2020 skin testing showed: Positive to ragweed and dust mites, molds and cockroaches.  Continue environmental control measures.  Continue Flonase 1 spray per nostril 1-2 times a day as needed for nasal congestion.   Continue Cetirizine as above  Follow up in 3 months or sooner if needed.

## 2020-07-25 NOTE — Progress Notes (Signed)
Follow-up Note  RE: Markelle Asaro MRN: [id number] DOB: 1970/10/26 Date of Office Visit: 07/25/2020   History of present illness: Shelley Mccoy is a 49 y.o. female presenting today for follow-up of hives, asthma and allergic rhinitis with conjunctivitis.  She was last seen in the office on 05/17/2019 by Dr. Selena Batten.  She has been on Xolair for management of her hives and was getting good control.  However she states something change with her insurance and she was required to change her shipping location to a different pharmacy and her dose for October was never delivered for her.  Thus her last Xolair was on 06/04/2020.  Due to this she has been noticing some hives on her arms over the past week or so.  She is not currently taking cetirizine and famotidine daily. She also has noted over the past month without being on Xolair that she has been having more cough and has had a nighttime awakening last week.  She does feel that her asthma symptoms were better controlled when she was on monthly Xolair.  She states she is doing Advair in the mornings and also prior to going into work will use her albuterol.  She states she has to walk a good distance from her car to her job and does get winded been doing this activity.  She however has not had any ED or urgent care visits for asthma flares or any systemic steroid needs since her last visit. She states the fall is the worst time for her allergy symptoms.  She will normally have more runny nose, itchy throat and watery eyes.  She states she cannot stand anything in her nose but does use Flonase when needed.  She states she does not like to use the Atrovent.     Review of systems: Review of Systems  Constitutional: Negative.   HENT:       See HPI  Eyes:       See HPI  Respiratory:       See HPI  Cardiovascular: Negative.   Gastrointestinal: Negative.   Musculoskeletal: Negative.   Skin: Positive for itching and rash.  Neurological: Negative.      All other systems negative unless noted above in HPI  Past medical/social/surgical/family history have been reviewed and are unchanged unless specifically indicated below.  No changes  Medication List: Current Outpatient Medications  Medication Sig Dispense Refill  . albuterol (VENTOLIN HFA) 108 (90 Base) MCG/ACT inhaler Inhale 2 puffs into the lungs every 6 (six) hours as needed for wheezing or shortness of breath. 18 g 1  . Ascorbic Acid (VITAMIN C PO) Take 1 tablet by mouth daily.    . cetirizine (ZYRTEC ALLERGY) 10 MG tablet Take 1 tablet (10 mg total) by mouth 2 (two) times daily. 60 tablet 5  . conjugated estrogens (PREMARIN) vaginal cream Place 1 Applicatorful vaginally daily. 42.5 g 12  . EPINEPHrine 0.3 mg/0.3 mL IJ SOAJ injection     . famotidine (PEPCID) 20 MG tablet Take 1 tablet (20 mg total) by mouth 2 (two) times daily. 60 tablet 5  . Ferrous Sulfate (IRON PO) Take 1 tablet by mouth daily.    . fluticasone-salmeterol (ADVAIR HFA) 115-21 MCG/ACT inhaler Inhale 2 puffs into the lungs 2 (two) times daily as needed (for flares). 12 g 5  . gabapentin (NEURONTIN) 300 MG capsule Take 300 mg by mouth daily.     . hydrochlorothiazide (HYDRODIURIL) 12.5 MG tablet Take 12.5 mg by mouth daily.    Marland Kitchen  insulin glargine (LANTUS) 100 UNIT/ML injection Inject 18 Units into the skin at bedtime.     Marland Kitchen ipratropium (ATROVENT) 0.03 % nasal spray Place 2 sprays into both nostrils 2 (two) times daily.    Marland Kitchen losartan (COZAAR) 50 MG tablet Take 50 mg by mouth daily.    . Omega-3 Fatty Acids (FISH OIL) 1000 MG CAPS Take by mouth.    . ondansetron (ZOFRAN) 4 MG tablet Take 4 mg by mouth every 8 (eight) hours as needed for nausea or vomiting.    . pravastatin (PRAVACHOL) 20 MG tablet Take 20 mg by mouth at bedtime.     . sertraline (ZOLOFT) 50 MG tablet Take 50 mg by mouth at bedtime.     . valACYclovir (VALTREX) 1000 MG tablet Take 1 tablet (1,000 mg total) by mouth 3 (three) times daily. 21 tablet 0   . VITAMIN D PO Take 1 tablet by mouth daily.    Geoffry Paradise 150 MG injection INJECT 300 MG SUBCUTANEOUSLY EVERY 4 WEEKS. 2 each 10  . EPINEPHrine (EPIPEN 2-PAK) 0.3 mg/0.3 mL IJ SOAJ injection Inject 0.3 mg into the muscle once for 1 dose. 0.3 mL 1  . fluticasone (FLONASE) 50 MCG/ACT nasal spray 1 spray per nostril 1-2 times daily as needed for nasal congestion. 1 g 5   Current Facility-Administered Medications  Medication Dose Route Frequency Provider Last Rate Last Admin  . omalizumab Geoffry Paradise) injection 300 mg  300 mg Subcutaneous Q28 days Alfonse Spruce, MD   300 mg at 07/25/20 1204     Known medication allergies: Allergies  Allergen Reactions  . Percocet [Oxycodone-Acetaminophen] Hives     Physical examination: Blood pressure 134/78, pulse 71, temperature 97.8 F (36.6 C), temperature source Temporal, resp. rate 18, height 5' (1.524 m), weight 220 lb 9.6 oz (100.1 kg), SpO2 99 %.  General: Alert, interactive, in no acute distress. HEENT: PERRLA, TMs pearly gray, turbinates minimally edematous without discharge, post-pharynx non erythematous. Neck: Supple without lymphadenopathy. Lungs: Clear to auscultation without wheezing, rhonchi or rales. {no increased work of breathing. CV: Normal S1, S2 without murmurs. Abdomen: Nondistended, nontender. Skin: Scattered erythematous urticarial type lesions primarily located Left antecubital fossa , nonvesicular. Extremities:  No clubbing, cyanosis or edema. Neuro:   Grossly intact.  Diagnositics/Labs:  Spirometry: FEV1: 1.26 L 65%, FVC: 1.62 L 67%, ratio consistent with This is a restrictive pattern however this is a much improved lung function than her prior study  Assessment and plan:   Chronic urticaria  Continue Xolair injections 300mg  every 4 weeks.  Sample provided today to get back on track with monthly dosing.   You should have your own supply for Novembers dose.   Continue zyrtec (cetirizine) 10mg  and pepcid  (famotidine) 20mg  twice a day.   Monitor symptoms.   Avoid the following potential triggers: alcohol, tight clothing, NSAIDs.  Moderate persistent asthma without complication  Daily controller medication(s):continue Advair 2 puffs twice a day with spacer and rinse mouth afterwards.  Prior to physical activity:May use albuterol rescue inhaler 2 puffs 5 to 15 minutes prior to strenuous physical activities.  Rescue medications:May use albuterol rescue inhaler 2 puffs or nebulizer every 4 to 6 hours as needed for shortness of breath, chest tightness, coughing, and wheezing. Monitor frequency of use. Asthma control goals:  Full participation in all desired activities (may need albuterol before activity) Albuterol use two times or less a week on average (not counting use with activity) Cough interfering with sleep two times or less  a month Oral steroids no more than once a year No hospitalizations  Seasonal and perennial allergic rhinoconjunctivitis 2020 skin testing showed: Positive to ragweed and dust mites, molds and cockroaches.  Continue environmental control measures.  Continue Flonase 1 spray per nostril 1-2 times a day as needed for nasal congestion.   Continue Cetirizine as above  Follow up in 3 months or sooner if needed.   I appreciate the opportunity to take part in Rubi's care. Please do not hesitate to contact me with questions.  Sincerely,   Margo Aye, MD Allergy/Immunology [facility]

## 2020-07-26 ENCOUNTER — Other Ambulatory Visit: Payer: Self-pay

## 2020-07-26 MED ORDER — FLUTICASONE-SALMETEROL 115-21 MCG/ACT IN AERO: 2.0000 | INHALATION_SPRAY | Freq: Two times a day (BID) | RESPIRATORY_TRACT | 5 refills | Status: DC | PRN

## 2020-07-26 MED FILL — LOSARTAN POTASSIUM 50 MG TA: 50 | 90 days supply | Qty: 90 | Fill #0

## 2020-07-26 MED FILL — glipiZIDE 10 MG TABS: 10 | 30 days supply | Qty: 30 | Fill #0

## 2020-07-26 MED FILL — HYDROCHLOROTHIAZIDE 12.5 MG: 12.5 | 90 days supply | Qty: 90 | Fill #0

## 2020-07-26 MED FILL — PRAVASTATIN SODIUM 20 MG TA: 20 | 90 days supply | Qty: 90 | Fill #0

## 2020-07-26 MED FILL — SERTRALINE HCL 50 MG TABLET: 50 | 90 days supply | Qty: 90 | Fill #0

## 2020-07-26 MED FILL — TRIAMCINOLONE 0.1% OINTMEN: 0.1 | 30 days supply | Qty: 30 | Fill #0

## 2020-07-29 ENCOUNTER — Other Ambulatory Visit (HOSPITAL_COMMUNITY): Payer: Self-pay

## 2020-07-29 MED ORDER — XOLAIR 150 MG ~~LOC~~ SOLR: SUBCUTANEOUS | 10 refills | Status: DC

## 2020-07-29 NOTE — Addendum Note (Signed)
Addended by: Devoria Glassing on: 07/29/2020 04:30 PM   Modules accepted: Orders

## 2020-07-29 NOTE — Telephone Encounter (Signed)
L/m for patient approval and ERX, approval and copay card info to [facility]. They will reach out to her to ship to office or her pick up same to bring to appt

## 2020-07-30 ENCOUNTER — Telehealth: Payer: Self-pay | Admitting: Pharmacist

## 2020-07-30 NOTE — Telephone Encounter (Signed)
Called patient to schedule an appointment for the  [employer] [facility]. I was unable to reach the patient so I left a HIPAA-compliant message requesting that the patient return my call.   

## 2020-07-31 ENCOUNTER — Telehealth: Payer: Self-pay | Admitting: Allergy

## 2020-07-31 NOTE — Telephone Encounter (Signed)
Pharmacy was calling about a rx being called in [phone].

## 2020-07-31 NOTE — Telephone Encounter (Signed)
Would prefer she stay with an HFA option.  Is there a more cost effective HFA? Discus can be more difficult to get proper dosage vs the HFA which is easier and much more tolerable.

## 2020-07-31 NOTE — Telephone Encounter (Signed)
Called and spoke with the pharmacy and they stated that Advair HFA is $70 but if she is switched to an Advair Diskus it would be $5. Please advise change in inhaler. Thank You.  

## 2020-07-31 NOTE — Telephone Encounter (Signed)
Called and spoke with the pharmacy and they stated that Advair HFA is $70 but if she is switched to an Advair Diskus it would be $5. Please advise change in inhaler. Thank You.

## 2020-08-01 ENCOUNTER — Other Ambulatory Visit: Payer: Self-pay | Admitting: *Deleted

## 2020-08-01 MED FILL — ADVAIR HFA 115-21 MCG INH: 115-21 | 30 days supply | Qty: 12 | Fill #0

## 2020-08-01 NOTE — Telephone Encounter (Signed)
Called and spoke with the pharmacist and she did state that unfortunately with the patient's insurance most HFA's are more pricey than the Diskus. Advised to continue the patient on Advair HFA for now and if she has any issues to please contact us. Pharmacist verbalized understanding.

## 2020-08-09 ENCOUNTER — Other Ambulatory Visit (HOSPITAL_COMMUNITY): Payer: Self-pay | Admitting: Internal Medicine

## 2020-08-09 ENCOUNTER — Telehealth: Payer: Self-pay

## 2020-08-09 ENCOUNTER — Ambulatory Visit (HOSPITAL_BASED_OUTPATIENT_CLINIC_OR_DEPARTMENT_OTHER): Payer: Self-pay | Admitting: Pharmacist

## 2020-08-09 ENCOUNTER — Other Ambulatory Visit: Payer: Self-pay

## 2020-08-09 DIAGNOSIS — Z79899 Other long term (current) drug therapy: Secondary | ICD-10-CM

## 2020-08-09 MED ORDER — XOLAIR 150 MG ~~LOC~~ SOLR: SUBCUTANEOUS | 10 refills | Status: DC

## 2020-08-09 MED FILL — FREESTYLE LITE TEST STRIP: 25 days supply | Qty: 50 | Fill #0

## 2020-08-09 MED FILL — FREESTYLE LANCETS: 50 days supply | Qty: 100 | Fill #0

## 2020-08-09 MED FILL — FREESTYLE LITE METER: W/DEVICE | 30 days supply | Qty: 1 | Fill #0

## 2020-08-09 NOTE — Progress Notes (Addendum)
   S: Patient presents for review of their specialty medication therapy.  Patient is currently taking Xolair for chronic urticaria. Patient is managed by Dr. Selena Batten for this.   Adherence: has been on Xolair for about a year now.   Efficacy: reports good results  Dosing: Give subcutaneously.  Can be dosed every 2 or 4 weeks based on baseline serum IgE levels and body weight. Chronic idiopathic urticaria: SubQ: 150 or 300 mg every 4 weeks. Dosing is not dependent on serum IgE (free or total) level or body weight.  Dose adjustments: Renal: no dose adjustments  Hepatic: no dose adjustments  Toxicity: Severe hypersensitivity reaction or anaphylaxis: Discontinue treatment. Fever, arthralgia, and rash: Discontinue treatment if this constellation of symptoms occurs.  Drug-drug interactions: none identified   Monitoring: CV effects: denied  Eosinophilia and vasculitis: denied  Fever/arthralgia/rash: denied  Hypersensitivity/Anaphylaxis: denied  Malignant neoplasms: denied  Pulmonary function tests (for asthma): denied    O:     Lab Results  Component Value Date   WBC 5.8 03/23/2019   HGB 11.2 03/23/2019   HCT 34.8 03/23/2019   MCV 80 03/23/2019   PLT 259 03/23/2019      Chemistry      Component Value Date/Time   NA 139 03/23/2019 1601   K 3.5 03/23/2019 1601   CL 101 03/23/2019 1601   CO2 24 03/23/2019 1601   BUN 15 03/23/2019 1601   CREATININE 0.90 05/07/2020 0805      Component Value Date/Time   CALCIUM 9.2 03/23/2019 1601   ALKPHOS 108 03/23/2019 1601   AST 11 03/23/2019 1601   ALT 19 03/23/2019 1601   BILITOT 0.6 03/23/2019 1601       A/P: 1. Medication review: patient currently on Xolair for chronic urticaria. Reviewed the medication with the patient, including the following: Xolair, omalizumab, is a novel IgE blocker.  It appears to reduce rates of hospitalizations, ER visits and unscheduled physician visits due asthma exacerbations when added to standard  therapy.  Studies also show a reduction in steroid requirements and improvement in quality of life.  Patient educated on purpose, proper use and potential adverse effects of Xolair.  Following instruction patient verbalized understanding. Patient should always have an EpiPen readily available in the event of anaphylaxis. SubQ: For SubQ injection only; doses >150 mg should be divided over more than one injection site (eg, 225 mg or 300 mg administered as two injections, 375 mg administered as three injections); each injection site should be separated by ?1 inch. Do not inject into moles, scars, bruises, tender areas, or broken skin. Injections may take 5 to 10 seconds to administer (solution is slightly viscous). Administer only under direct medical supervision and observe patient for 2 hours after the first 3 injections and 30 minutes after subsequent injections Angela Nevin 2015) or in accordance with individual institution policies and procedures.No recommendations for any changes at this time.   Butch Penny, PharmD, CPP Clinical Pharmacist [facility] [phone]

## 2020-08-09 NOTE — Telephone Encounter (Signed)
Copied from CRM [id number]. Topic: General - Call Back - No Documentation >> Aug 09, 2020  1:42 PM Randol Kern wrote: [phone] Pt is requesting a call back from [facility] regarding her allergy shot, she has questions please advise

## 2020-08-09 NOTE — Telephone Encounter (Signed)
Call placed to patient with no answer. Left VM with my extension.

## 2020-08-21 ENCOUNTER — Ambulatory Visit (INDEPENDENT_AMBULATORY_CARE_PROVIDER_SITE_OTHER): Payer: 59 | Admitting: *Deleted

## 2020-08-21 ENCOUNTER — Other Ambulatory Visit: Payer: Self-pay

## 2020-08-21 DIAGNOSIS — L501 Idiopathic urticaria: Secondary | ICD-10-CM

## 2020-08-21 DIAGNOSIS — L508 Other urticaria: Secondary | ICD-10-CM

## 2020-08-21 MED FILL — XOLAIR 150 MG SOLR: 150 | 28 days supply | Qty: 2 | Fill #0

## 2020-09-05 ENCOUNTER — Telehealth: Payer: Self-pay

## 2020-09-05 NOTE — Telephone Encounter (Signed)
Patient sent mychart message stating that she seen blood in the toilet and on tissue after using the bathroom today. Patient denies having intercourse, and denies having any UTI symptoms. She states that bleeding is not coming from her rectum. Patient advised that she needs to be seen. Patient transferred to front desk staff to schedule an appt.

## 2020-09-09 ENCOUNTER — Ambulatory Visit (INDEPENDENT_AMBULATORY_CARE_PROVIDER_SITE_OTHER): Payer: 59 | Admitting: Obstetrics and Gynecology

## 2020-09-09 ENCOUNTER — Other Ambulatory Visit: Payer: Self-pay

## 2020-09-09 ENCOUNTER — Other Ambulatory Visit (HOSPITAL_COMMUNITY)
Admission: RE | Admit: 2020-09-09 | Discharge: 2020-09-09 | Disposition: A | Discharge status: Home / Self Care | Payer: 59 | Source: Ambulatory Visit | Attending: Obstetrics and Gynecology | Admitting: Obstetrics and Gynecology

## 2020-09-09 ENCOUNTER — Encounter: Payer: Self-pay | Admitting: Obstetrics and Gynecology

## 2020-09-09 VITALS — BP 126/80 | HR 72 | Wt 221.0 lb

## 2020-09-09 DIAGNOSIS — N939 Abnormal uterine and vaginal bleeding, unspecified: Secondary | ICD-10-CM | POA: Diagnosis not present

## 2020-09-09 NOTE — Progress Notes (Signed)
49 yo P3 s/p hysterectomy several years ago presenting today for the evaluation of vaginal bleeding. Patient reports onset of bleeding on 12/9 for a brief moment. She states that it was bright red bleeding which filled up the comode and noticed it upon wiping. She denies pelvic pain or dysuria. She is certain it was vaginal bleeding rather than rectal bleeding. Patient states she returned to the bathroom minutes later with complete resolution of vaginal bleeding. Patient is not sexually active. She denies pelvic abnormal discharge  Past Medical History:  Diagnosis Date  . Anemia   . Angioedema of lips 03/22/2019  . Asthma   . Chronic urticaria 03/22/2019  . Conversion disorder   . Diabetes mellitus without complication (HCC)   . Functional gait abnormality   . Heart murmur   . History of uterine fibroid   . HSV-2 (herpes simplex virus 2) infection   . Hypertension   . Seasonal allergies    Past Surgical History:  Procedure Laterality Date  . CYSTECTOMY     hand and breast  . LAPAROSCOPIC ABDOMINAL EXPLORATION    . TOTAL ABDOMINAL HYSTERECTOMY     Family History  Problem Relation Age of Onset  . Diabetes Mother   . Congestive Heart Failure Mother   . Kidney failure Mother   . Hypertension Mother   . Asthma Mother   . Prostate cancer Father   . Hypertension Sister   . Diabetes Maternal Grandmother   . Congestive Heart Failure Maternal Grandmother   . Kidney failure Maternal Grandmother   . Alzheimer's disease Paternal Grandmother    Social History   Tobacco Use  . Smoking status: Never Smoker  . Smokeless tobacco: Never Used  Vaping Use  . Vaping Use: Never used  Substance Use Topics  . Alcohol use: No  . Drug use: No   ROS See pertinent in HPI. All other systems reviewed and non contributory  Blood pressure 126/80, pulse 72, weight 221 lb (100.2 kg) GENERAL: Well-developed, well-nourished female in no acute distress.  ABDOMEN: Soft, nontender, nondistended. No  organomegaly. PELVIC: Normal external female genitalia. Vagina is pink and rugated.  Normal discharge. Vaginal cuff intact.  No adnexal mass or tenderness. EXTREMITIES: No cyanosis, clubbing, or edema, 2+ distal pulses.  A/P 49 yo with vaginal bleeding s/p hysterectomy - Unclear etiology of bleeding - Urine culture sent - vaginal swab collected - Patient will be contacted with abnormal results

## 2020-09-09 NOTE — Progress Notes (Signed)
Pt states that she had an episode of bleeding, pt states large amount.  Pt states she is having some lower right side pain.

## 2020-09-10 LAB — CERVICOVAGINAL ANCILLARY ONLY
Bacterial Vaginitis (gardnerella): NEGATIVE
Candida Glabrata: NEGATIVE
Candida Vaginitis: NEGATIVE
Comment: NEGATIVE
Comment: NEGATIVE
Comment: NEGATIVE

## 2020-09-11 LAB — URINE CULTURE

## 2020-09-18 ENCOUNTER — Ambulatory Visit: Payer: Self-pay

## 2020-09-19 ENCOUNTER — Ambulatory Visit: Payer: Self-pay

## 2020-09-24 MED FILL — XOLAIR 150 MG SOLR: 150 | 28 days supply | Qty: 2 | Fill #1

## 2020-09-25 ENCOUNTER — Ambulatory Visit (INDEPENDENT_AMBULATORY_CARE_PROVIDER_SITE_OTHER): Payer: 59

## 2020-09-25 DIAGNOSIS — J4540 Moderate persistent asthma, uncomplicated: Secondary | ICD-10-CM

## 2020-10-01 MED FILL — valACYclovir HCL 1 GM TABS: 1 | 90 days supply | Qty: 90 | Fill #0

## 2020-10-17 MED FILL — XOLAIR 150 MG SOLR: 150 | 28 days supply | Qty: 2 | Fill #2

## 2020-10-19 DIAGNOSIS — M545 Low back pain, unspecified: Secondary | ICD-10-CM | POA: Diagnosis not present

## 2020-10-19 DIAGNOSIS — M546 Pain in thoracic spine: Secondary | ICD-10-CM | POA: Diagnosis not present

## 2020-10-19 DIAGNOSIS — W1809XA Striking against other object with subsequent fall, initial encounter: Secondary | ICD-10-CM | POA: Diagnosis not present

## 2020-10-19 DIAGNOSIS — Y999 Unspecified external cause status: Secondary | ICD-10-CM | POA: Diagnosis not present

## 2020-10-19 DIAGNOSIS — S39012A Strain of muscle, fascia and tendon of lower back, initial encounter: Secondary | ICD-10-CM | POA: Diagnosis not present

## 2020-10-19 DIAGNOSIS — R296 Repeated falls: Secondary | ICD-10-CM | POA: Diagnosis not present

## 2020-10-21 ENCOUNTER — Other Ambulatory Visit: Payer: Self-pay

## 2020-10-21 ENCOUNTER — Telehealth: Payer: Self-pay

## 2020-10-21 ENCOUNTER — Encounter (HOSPITAL_BASED_OUTPATIENT_CLINIC_OR_DEPARTMENT_OTHER): Payer: Self-pay

## 2020-10-21 ENCOUNTER — Emergency Department (HOSPITAL_BASED_OUTPATIENT_CLINIC_OR_DEPARTMENT_OTHER)
Admission: EM | Admit: 2020-10-21 | Discharge: 2020-10-21 | Disposition: A | Discharge status: Home / Self Care | Payer: 59 | Attending: Emergency Medicine | Admitting: Emergency Medicine

## 2020-10-21 ENCOUNTER — Emergency Department (HOSPITAL_BASED_OUTPATIENT_CLINIC_OR_DEPARTMENT_OTHER): Payer: 59

## 2020-10-21 ENCOUNTER — Other Ambulatory Visit (HOSPITAL_BASED_OUTPATIENT_CLINIC_OR_DEPARTMENT_OTHER): Payer: Self-pay | Admitting: Emergency Medicine

## 2020-10-21 DIAGNOSIS — W19XXXA Unspecified fall, initial encounter: Secondary | ICD-10-CM | POA: Diagnosis not present

## 2020-10-21 DIAGNOSIS — J4540 Moderate persistent asthma, uncomplicated: Secondary | ICD-10-CM | POA: Diagnosis not present

## 2020-10-21 DIAGNOSIS — S82842A Displaced bimalleolar fracture of left lower leg, initial encounter for closed fracture: Secondary | ICD-10-CM | POA: Diagnosis not present

## 2020-10-21 DIAGNOSIS — S82892A Other fracture of left lower leg, initial encounter for closed fracture: Secondary | ICD-10-CM | POA: Insufficient documentation

## 2020-10-21 DIAGNOSIS — I10 Essential (primary) hypertension: Secondary | ICD-10-CM | POA: Insufficient documentation

## 2020-10-21 DIAGNOSIS — S82432A Displaced oblique fracture of shaft of left fibula, initial encounter for closed fracture: Secondary | ICD-10-CM | POA: Diagnosis not present

## 2020-10-21 DIAGNOSIS — Z79899 Other long term (current) drug therapy: Secondary | ICD-10-CM | POA: Diagnosis not present

## 2020-10-21 DIAGNOSIS — S99912A Unspecified injury of left ankle, initial encounter: Secondary | ICD-10-CM | POA: Diagnosis present

## 2020-10-21 DIAGNOSIS — W009XXA Unspecified fall due to ice and snow, initial encounter: Secondary | ICD-10-CM | POA: Diagnosis not present

## 2020-10-21 DIAGNOSIS — T1490XA Injury, unspecified, initial encounter: Secondary | ICD-10-CM

## 2020-10-21 DIAGNOSIS — Y9289 Other specified places as the place of occurrence of the external cause: Secondary | ICD-10-CM | POA: Insufficient documentation

## 2020-10-21 DIAGNOSIS — Z794 Long term (current) use of insulin: Secondary | ICD-10-CM | POA: Insufficient documentation

## 2020-10-21 DIAGNOSIS — E119 Type 2 diabetes mellitus without complications: Secondary | ICD-10-CM | POA: Diagnosis not present

## 2020-10-21 DIAGNOSIS — S99919A Unspecified injury of unspecified ankle, initial encounter: Secondary | ICD-10-CM | POA: Diagnosis not present

## 2020-10-21 DIAGNOSIS — S8252XA Displaced fracture of medial malleolus of left tibia, initial encounter for closed fracture: Secondary | ICD-10-CM | POA: Diagnosis not present

## 2020-10-21 DIAGNOSIS — M7989 Other specified soft tissue disorders: Secondary | ICD-10-CM | POA: Diagnosis not present

## 2020-10-21 MED ORDER — ONDANSETRON HCL 4 MG/2ML IJ SOLN
4.0000 mg | Freq: Once | INTRAMUSCULAR | Status: AC
  Administered 2020-10-21: 4 mg via INTRAVENOUS
  Filled 2020-10-21: qty 2

## 2020-10-21 MED ORDER — OXYCODONE HCL 5 MG PO TABS: 5.0000 mg | ORAL_TABLET | Freq: Four times a day (QID) | ORAL | 0 refills | Status: DC | PRN

## 2020-10-21 MED ORDER — SODIUM CHLORIDE 0.9 % IV BOLUS
1000.0000 mL | Freq: Once | INTRAVENOUS | Status: AC
  Administered 2020-10-21: 1000 mL via INTRAVENOUS

## 2020-10-21 MED ORDER — OXYCODONE HCL 5 MG PO TABS
5.0000 mg | ORAL_TABLET | Freq: Once | ORAL | Status: AC
  Administered 2020-10-21: 5 mg via ORAL
  Filled 2020-10-21: qty 1

## 2020-10-21 MED ORDER — PROPOFOL 10 MG/ML IV BOLUS
INTRAVENOUS | Status: AC | PRN
  Administered 2020-10-21 (×2): 20 mg via INTRAVENOUS
  Administered 2020-10-21: 30 mg via INTRAVENOUS

## 2020-10-21 MED ORDER — PROPOFOL 10 MG/ML IV BOLUS
1.0000 mg/kg | Freq: Once | INTRAVENOUS | Status: AC
  Administered 2020-10-21: 50 mg via INTRAVENOUS
  Filled 2020-10-21: qty 20

## 2020-10-21 MED FILL — oxyCODONE HCL 5 MG TABS: 5 | 3 days supply | Qty: 15 | Fill #0

## 2020-10-21 NOTE — ED Notes (Signed)
ED Provider at bedside. 

## 2020-10-21 NOTE — ED Provider Notes (Signed)
[facility] EMERGENCY DEPARTMENT Provider Note   CSN: [id number] Arrival date & time: 10/21/20  3790     History Chief Complaint  Patient presents with  . Ankle Pain    Shelley Mccoy is a 50 y.o. female.  The history is provided by the patient.  Ankle Pain Location:  Ankle Injury: yes   Ankle location:  L ankle Pain details:    Quality:  Aching   Radiates to:  Does not radiate   Severity:  Mild   Onset quality:  Sudden   Timing:  Constant   Progression:  Unchanged Chronicity:  New Relieved by:  Nothing Exacerbated by: movement. Associated symptoms: decreased ROM   Associated symptoms: no back pain, no fever, no numbness, no swelling and no tingling        Past Medical History:  Diagnosis Date  . Anemia   . Angioedema of lips 03/22/2019  . Asthma   . Chronic urticaria 03/22/2019  . Conversion disorder   . Diabetes mellitus without complication (HCC)   . Functional gait abnormality   . Heart murmur   . History of uterine fibroid   . HSV-2 (herpes simplex virus 2) infection   . Hypertension   . Seasonal allergies     Patient Active Problem List   Diagnosis Date Noted  . Transient right leg weakness 12/12/2019  . Moderate persistent asthma without complication 04/20/2019  . Seasonal and perennial allergic rhinoconjunctivitis 04/20/2019  . Chronic urticaria 03/22/2019  . Angioedema of lips 03/22/2019  . Other allergic rhinitis 03/22/2019  . Allergic conjunctivitis of both eyes 03/22/2019  . Morbid obesity with BMI of 40.0-44.9, adult (HCC) 12/01/2017  . Type 2 diabetes mellitus without complication, without long-term current use of insulin (HCC) 12/01/2017  . Essential hypertension 12/01/2017  . Hyperlipidemia LDL goal <100 12/01/2017  . 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 Asthma 01/12/2013  . GERD (gastroesophageal reflux disease) 01/12/2013  . Antral gastritis 07/01/2012    Past Surgical History:  Procedure Laterality Date  .  CYSTECTOMY     hand and breast  . LAPAROSCOPIC ABDOMINAL EXPLORATION    . TOTAL ABDOMINAL HYSTERECTOMY       OB History    Gravida  4   Para  3   Term  3   Preterm      AB  1   Living  3     SAB  1   IAB      Ectopic      Multiple      Live Births  3           Family History  Problem Relation Age of Onset  . Diabetes Mother   . Congestive Heart Failure Mother   . Kidney failure Mother   . Hypertension Mother   . Asthma Mother   . Prostate cancer Father   . Hypertension Sister   . Diabetes Maternal Grandmother   . Congestive Heart Failure Maternal Grandmother   . Kidney failure Maternal Grandmother   . Alzheimer's disease Paternal Grandmother     Social History   Tobacco Use  . Smoking status: Never Smoker  . Smokeless tobacco: Never Used  Vaping Use  . Vaping Use: Never used  Substance Use Topics  . Alcohol use: No  . Drug use: No    Home Medications Prior to Admission medications   Medication Sig Start Date End Date Taking? Authorizing Provider  albuterol (VENTOLIN HFA) 108 (90 Base) MCG/ACT inhaler Inhale 2 puffs  into the lungs every 6 (six) hours as needed for wheezing or shortness of breath. 07/25/20   Marcelyn BruinsPadgett, Shaylar Patricia, MD  Ascorbic Acid (VITAMIN C PO) Take 1 tablet by mouth daily.    [provider]  cetirizine (ZYRTEC ALLERGY) 10 MG tablet Take 1 tablet (10 mg total) by mouth 2 (two) times daily. 07/25/20   Marcelyn BruinsPadgett, Shaylar Patricia, MD  conjugated estrogens (PREMARIN) vaginal cream Place 1 Applicatorful vaginally daily. 08/10/19   Constant, Peggy, MD  EPINEPHrine 0.3 mg/0.3 mL IJ SOAJ injection  04/19/19   [provider]  famotidine (PEPCID) 20 MG tablet Take 1 tablet (20 mg total) by mouth 2 (two) times daily. 07/25/20   Marcelyn BruinsPadgett, Shaylar Patricia, MD  Ferrous Sulfate (IRON PO) Take 1 tablet by mouth daily.    [provider]  fluticasone (FLONASE) 50 MCG/ACT nasal spray 1 spray per nostril 1-2 times  daily as needed for nasal congestion. 07/25/20   Marcelyn BruinsPadgett, Shaylar Patricia, MD  fluticasone-salmeterol (ADVAIR HFA) [id number] MCG/ACT inhaler Inhale 2 puffs into the lungs 2 (two) times daily as needed (for flares). 07/26/20   Marcelyn BruinsPadgett, Shaylar Patricia, MD  gabapentin (NEURONTIN) 300 MG capsule Take 300 mg by mouth daily.     [provider]  hydrochlorothiazide (HYDRODIURIL) 12.5 MG tablet Take 12.5 mg by mouth daily.    [provider]  insulin glargine (LANTUS) 100 UNIT/ML injection Inject 18 Units into the skin at bedtime.     [provider]  ipratropium (ATROVENT) 0.03 % nasal spray Place 2 sprays into both nostrils 2 (two) times daily. 12/15/18   [provider]  losartan (COZAAR) 50 MG tablet Take 50 mg by mouth daily.    [provider]  omalizumab Geoffry Paradise(XOLAIR) 150 MG injection INJECT 300 MG SUBCUTANEOUSLY EVERY 4 WEEKS. 08/09/20   Quentin AngstJegede, Olugbemiga E, MD  Omega-3 Fatty Acids (FISH OIL) 1000 MG CAPS Take by mouth.    [provider]  ondansetron (ZOFRAN) 4 MG tablet Take 4 mg by mouth every 8 (eight) hours as needed for nausea or vomiting.    [provider]  pravastatin (PRAVACHOL) 20 MG tablet Take 20 mg by mouth at bedtime.     [provider]  sertraline (ZOLOFT) 50 MG tablet Take 50 mg by mouth at bedtime.     [provider]  valACYclovir (VALTREX) 1000 MG tablet Take 1 tablet (1,000 mg total) by mouth 3 (three) times daily. 12/18/18   Gerhard MunchLockwood, Robert, MD  VITAMIN D PO Take 1 tablet by mouth daily.    [provider]    Allergies    Percocet [oxycodone-acetaminophen]  Review of Systems   Review of Systems  Constitutional: Negative for chills and fever.  HENT: Negative for ear pain and sore throat.   Eyes: Negative for pain and visual disturbance.  Respiratory: Negative for cough and shortness of breath.   Cardiovascular: Negative for chest pain and palpitations.  Gastrointestinal: Negative for  abdominal pain and vomiting.  Genitourinary: Negative for dysuria and hematuria.  Musculoskeletal: Positive for arthralgias. Negative for back pain.  Skin: Negative for color change and rash.  Neurological: Negative for seizures and syncope.  All other systems reviewed and are negative.   Physical Exam Updated Vital Signs BP (!) 142/80 (BP Location: Left Wrist)   Pulse 69   Temp 98.1 F (36.7 C) (Oral)   Resp 17   Ht 5' (1.524 m)   Wt 96.9 kg   SpO2 100%   BMI 41.72 kg/m  Physical Exam Vitals and nursing note reviewed.  Constitutional:      General: She is not in acute distress.    Appearance: She is well-developed and well-nourished.  HENT:     Head: Normocephalic and atraumatic.  Eyes:     Conjunctiva/sclera: Conjunctivae normal.  Cardiovascular:     Rate and Rhythm: Normal rate and regular rhythm.     Heart sounds: No murmur heard.   Pulmonary:     Effort: Pulmonary effort is normal. No respiratory distress.     Breath sounds: Normal breath sounds.  Abdominal:     Palpations: Abdomen is soft.     Tenderness: There is no abdominal tenderness.  Musculoskeletal:        General: Tenderness (TTP to left ankle) present. No edema.     Cervical back: Neck supple.  Skin:    General: Skin is warm and dry.  Neurological:     Mental Status: She is alert.  Psychiatric:        Mood and Affect: Mood and affect normal.     ED Results / Procedures / Treatments   Labs (all labs ordered are listed, but only abnormal results are displayed) Labs Reviewed - No data to display  EKG None  Radiology DG Ankle Complete Left  Result Date: 10/21/2020 CLINICAL DATA:  Pain following fall EXAM: LEFT ANKLE COMPLETE - 3+ VIEW COMPARISON:  None. FINDINGS: Frontal, oblique, and lateral views were obtained. There is an avulsion arising from the medial aspect of the talus. There is a spiral fracture of the distal fibular metaphysis-diaphysis junction. There is ankle mortise disruption  with the hindfoot displaced lateral with respect to the medial aspect of the tibial plafond. There is generalized soft tissue swelling. There are posterior and inferior calcaneal spurs. No significant joint space narrowing. IMPRESSION: Spiral fracture distal fibula at the metaphysis-diaphysis junction. Avulsion arising from the medial aspect of the talus. Gross ankle mortise disruption noted. There are calcaneal spurs. Electronically Signed   By: Bretta BangWilliam  Woodruff III M.D.   On: 10/21/2020 07:55   DG Ankle Left Port  Result Date: 10/21/2020 CLINICAL DATA:  Status post reduction of left ankle dislocation EXAM: PORTABLE LEFT ANKLE - 2 VIEW COMPARISON:  None. FINDINGS: Oblique fracture of the distal fibular metaphysis. Avulsion fracture of the distal tip of the medial malleolus. Persistent slight widening of the joint space between the medial malleolus and talus. No frank ankle dislocation on single AP view. No other fracture or dislocation. IMPRESSION: Oblique fracture of the distal fibular metaphysis. Avulsion fracture of the distal tip of the medial malleolus. Persistent slight widening of the joint space between the medial malleolus and talus. Electronically Signed   By: Elige KoHetal  Patel   On: 10/21/2020 10:06    Procedures .Sedation  Date/Time: 10/21/2020 8:34 AM Performed by: Virgina Norfolkuratolo, Almira Phetteplace, DO Authorized by: Virgina Norfolkuratolo, Jahnavi Muratore, DO   Consent:    Consent obtained:  Verbal   Consent given by:  Patient   Risks discussed:  Allergic reaction, dysrhythmia, inadequate sedation, nausea, prolonged hypoxia resulting in organ damage, prolonged sedation necessitating reversal, respiratory compromise necessitating ventilatory assistance and intubation and vomiting   Alternatives discussed:  Analgesia without sedation, anxiolysis and regional anesthesia Universal protocol:    Procedure explained and questions answered to patient or proxy's satisfaction: yes     Relevant documents present and verified: yes     Test  results available: yes     Imaging studies available: yes     Required blood products, implants,  devices, and special equipment available: yes     Site/side marked: yes     Immediately prior to procedure, a time out was called: yes     Patient identity confirmed:  Verbally with patient Indications:    Procedure necessitating sedation performed by:  Physician performing sedation Pre-sedation assessment:    Time since last food or drink:  8 hours   ASA classification: class 1 - normal, healthy patient     Mouth opening:  3 or more finger widths   Thyromental distance:  4 finger widths   Mallampati score:  II - soft palate, uvula, fauces visible   Neck mobility: normal     Pre-sedation assessments completed and reviewed: airway patency, cardiovascular function, hydration status, mental status, nausea/vomiting, pain level, respiratory function and temperature     Pre-sedation assessment completed:  10/21/2020 8:34 AM Immediate pre-procedure details:    Reassessment: Patient reassessed immediately prior to procedure     Reviewed: vital signs, relevant labs/tests and NPO status     Verified: bag valve mask available, emergency equipment available, intubation equipment available, IV patency confirmed, oxygen available and suction available   Procedure details (see MAR for exact dosages):    Preoxygenation:  Nasal cannula   Sedation:  Propofol   Intended level of sedation: deep   Intra-procedure monitoring:  Blood pressure monitoring, cardiac monitor, continuous pulse oximetry, frequent LOC assessments, frequent vital sign checks and continuous capnometry   Intra-procedure events: none     Total Provider sedation time (minutes):  25 Post-procedure details:    Post-sedation assessment completed:  10/21/2020 10:19 AM   Attendance: Constant attendance by certified staff until patient recovered     Recovery: Patient returned to pre-procedure baseline     Post-sedation assessments completed and  reviewed: airway patency, cardiovascular function, hydration status, mental status, nausea/vomiting, pain level, respiratory function and temperature     Patient is stable for discharge or admission: yes     Procedure completion:  Tolerated well, no immediate complications .Ortho Injury Treatment  Date/Time: 10/21/2020 8:34 AM Performed by: Virgina Norfolk, DO Authorized by: Virgina Norfolk, DO   Consent:    Consent obtained:  Written   Consent given by:  Patient   Risks discussed:  Fracture, irreducible dislocation, nerve damage, recurrent dislocation, stiffness, restricted joint movement and vascular damage   Alternatives discussed:  No treatmentInjury location: ankle Location details: right ankle Injury type: fracture-dislocation Fracture type: bimalleolar Pre-procedure neurovascular assessment: neurovascularly intact Pre-procedure distal perfusion: normal Pre-procedure neurological function: normal Pre-procedure range of motion: reduced  Patient sedated: Yes. Refer to sedation procedure documentation for details of sedation. Manipulation performed: yes Reduction successful: yes X-ray confirmed reduction: yes Immobilization: splint Splint type: ankle stirrup and short leg Post-procedure neurovascular assessment: post-procedure neurovascularly intact Post-procedure distal perfusion: normal Post-procedure neurological function: normal Post-procedure range of motion comment: splinted Patient tolerance: patient tolerated the procedure well with no immediate complications    (including critical care time)  Medications Ordered in ED Medications  oxyCODONE (Oxy IR/ROXICODONE) immediate release tablet 5 mg (has no administration in time range)  ondansetron (ZOFRAN) injection 4 mg (4 mg Intravenous Given 10/21/20 0924)  sodium chloride 0.9 % bolus 1,000 mL (1,000 mLs Intravenous New Bag/Given 10/21/20 0923)  propofol (DIPRIVAN) 10 mg/mL bolus/IV push 96.9 mg (50 mg Intravenous Given  10/21/20 0929)  propofol (DIPRIVAN) 10 mg/mL bolus/IV push (30 mg Intravenous Given 10/21/20 0935)    ED Course  I have reviewed the triage vital signs and the nursing notes.  Pertinent labs & imaging results that were available during my care of the patient were reviewed by me and considered in my medical decision making (see chart for details).    MDM Rules/Calculators/A&P                          Shelley Mccoy is a 50 year old female who presents the ED with left ankle pain after fall.  Unremarkable vitals.  Tenderness and pain to the left ankle.  No obvious deformity on exam.  However, x-ray shows spiral fracture of the distal fibula as well as likely from the medial aspect of the talus.  There is also lateral displacement of the ankle mortise.  Dr. Susa Simmonds with orthopedics was consulted and will follow up outpatient.  We attempted propofol sedation for reduction which was overall successful and patient tolerated sedation well and there was some slight improvement with lateral translation of the ankle mortise.  Patient feels comfortable in her splint.  Neurovascularly intact before and after splint.  Will prescribe oxycodone.  Will prescribe knee scooter and have her follow-up with orthopedics.  Understands to be nonweightbearing.  Given return precautions and discharged in the ED in good condition.  This chart was dictated using voice recognition software.  Despite best efforts to proofread,  errors can occur which can change the documentation meaning.    Final Clinical Impression(s) / ED Diagnoses Final diagnoses:  Closed fracture of left ankle, initial encounter    Rx / DC Orders ED Discharge Orders    None       Virgina Norfolk, DO 10/21/20 1022

## 2020-10-21 NOTE — ED Notes (Signed)
Rx pad given to [facility] MD for Knee scooter

## 2020-10-21 NOTE — ED Notes (Signed)
Pt up with walker, resources given for where to find knee scooter if needed. Pt verbalizes understanding for follow up with ortho. Pt ride on the way.

## 2020-10-21 NOTE — Sedation Documentation (Signed)
Pt called out in pain, MD advises more propofol, see med given

## 2020-10-21 NOTE — ED Notes (Signed)
Pt attempted to walk to restroom with crutches, unable to tolerate r/t feeling nauseous, pt assisted back to bed, MD aware.

## 2020-10-21 NOTE — Telephone Encounter (Signed)
Patient called and stated she will not be able to come in for the next several weeks due to breaking her ankle and is requiring surgery. She will let us know when she will be able to make it back in to get her Xolair injections and do a follow up with Dr. Delorse Lek.

## 2020-10-21 NOTE — Discharge Instructions (Addendum)
Do not bear any weight to your left lower extremity.  Take Roxicodone as needed for pain.  Recommend you also take Tylenol Motrin as well for pain.  Follow-up with orthopedics.

## 2020-10-21 NOTE — ED Notes (Signed)
Pt eating apple sauce and drinking water at this time.

## 2020-10-21 NOTE — ED Triage Notes (Signed)
Pt arrives via [facility] after a fall on ice this morning. Denies hitting head, no LOC, no blood thinners. Pain to left ankle.   VS - 160/83 68 HR 20 RR 100% RA.

## 2020-10-21 NOTE — Telephone Encounter (Signed)
Thanks for update

## 2020-10-23 ENCOUNTER — Telehealth: Payer: Self-pay | Admitting: *Deleted

## 2020-10-23 ENCOUNTER — Other Ambulatory Visit: Payer: Self-pay | Admitting: Orthopaedic Surgery

## 2020-10-23 ENCOUNTER — Ambulatory Visit: Payer: Self-pay

## 2020-10-23 DIAGNOSIS — S93422A Sprain of deltoid ligament of left ankle, initial encounter: Secondary | ICD-10-CM | POA: Diagnosis not present

## 2020-10-23 DIAGNOSIS — S82432A Displaced oblique fracture of shaft of left fibula, initial encounter for closed fracture: Secondary | ICD-10-CM | POA: Diagnosis not present

## 2020-10-23 NOTE — Telephone Encounter (Signed)
Received call from [facility] stating they did not have contact number to assist with getting pt her Knee Scooter. Pt dropped off Rx but did not leave her contact number. TOC CM contacted pt to update. Isidoro Donning RN CCM, [facility] ED TOC CM [phone]

## 2020-10-25 ENCOUNTER — Encounter (HOSPITAL_BASED_OUTPATIENT_CLINIC_OR_DEPARTMENT_OTHER): Payer: Self-pay | Admitting: Orthopaedic Surgery

## 2020-10-25 ENCOUNTER — Ambulatory Visit: Payer: 59 | Admitting: Allergy

## 2020-10-25 ENCOUNTER — Other Ambulatory Visit: Payer: Self-pay

## 2020-10-25 DIAGNOSIS — S8292XA Unspecified fracture of left lower leg, initial encounter for closed fracture: Secondary | ICD-10-CM | POA: Diagnosis not present

## 2020-10-28 ENCOUNTER — Encounter (HOSPITAL_COMMUNITY)
Admission: RE | Admit: 2020-10-28 | Discharge: 2020-10-28 | Disposition: A | Discharge status: Home / Self Care | Payer: 59 | Source: Ambulatory Visit | Attending: Orthopaedic Surgery | Admitting: Orthopaedic Surgery

## 2020-10-28 ENCOUNTER — Encounter (HOSPITAL_BASED_OUTPATIENT_CLINIC_OR_DEPARTMENT_OTHER)
Admission: RE | Admit: 2020-10-28 | Discharge: 2020-10-28 | Disposition: A | Discharge status: Home / Self Care | Payer: 59 | Source: Ambulatory Visit | Attending: Orthopaedic Surgery | Admitting: Orthopaedic Surgery

## 2020-10-28 ENCOUNTER — Other Ambulatory Visit: Payer: Self-pay

## 2020-10-28 DIAGNOSIS — Z01812 Encounter for preprocedural laboratory examination: Secondary | ICD-10-CM | POA: Insufficient documentation

## 2020-10-28 DIAGNOSIS — Z20822 Contact with and (suspected) exposure to covid-19: Secondary | ICD-10-CM | POA: Insufficient documentation

## 2020-10-28 LAB — BASIC METABOLIC PANEL
Anion gap: 10 (ref 5–15)
BUN: 11 mg/dL (ref 6–20)
CO2: 27 mmol/L (ref 22–32)
Calcium: 9.3 mg/dL (ref 8.9–10.3)
Chloride: 103 mmol/L (ref 98–111)
Creatinine, Ser: 0.71 mg/dL (ref 0.44–1.00)
GFR, Estimated: >60 mL/min (ref >60)
Glucose, Bld: 105 mg/dL — ABNORMAL HIGH (ref 70–99)
Potassium: 4.2 mmol/L (ref 3.5–5.1)
Sodium: 140 mmol/L (ref 135–145)

## 2020-10-28 LAB — SARS CORONAVIRUS 2 (TAT 6-24 HRS): SARS Coronavirus 2: NEGATIVE

## 2020-10-28 NOTE — Progress Notes (Signed)

## 2020-10-31 ENCOUNTER — Encounter (HOSPITAL_BASED_OUTPATIENT_CLINIC_OR_DEPARTMENT_OTHER)
Admission: RE | Disposition: A | Discharge status: Home / Self Care | Payer: Self-pay | Source: Home / Self Care | Attending: Orthopaedic Surgery

## 2020-10-31 ENCOUNTER — Encounter (HOSPITAL_BASED_OUTPATIENT_CLINIC_OR_DEPARTMENT_OTHER): Payer: Self-pay | Admitting: Orthopaedic Surgery

## 2020-10-31 ENCOUNTER — Ambulatory Visit (HOSPITAL_BASED_OUTPATIENT_CLINIC_OR_DEPARTMENT_OTHER)
Admission: RE | Admit: 2020-10-31 | Discharge: 2020-10-31 | Disposition: A | Discharge status: Home / Self Care | Payer: 59 | Attending: Orthopaedic Surgery | Admitting: Orthopaedic Surgery

## 2020-10-31 ENCOUNTER — Ambulatory Visit (HOSPITAL_BASED_OUTPATIENT_CLINIC_OR_DEPARTMENT_OTHER): Payer: 59 | Admitting: Anesthesiology

## 2020-10-31 ENCOUNTER — Ambulatory Visit (HOSPITAL_COMMUNITY): Payer: 59

## 2020-10-31 ENCOUNTER — Other Ambulatory Visit: Payer: Self-pay

## 2020-10-31 ENCOUNTER — Other Ambulatory Visit (HOSPITAL_COMMUNITY): Payer: Self-pay | Admitting: Orthopaedic Surgery

## 2020-10-31 DIAGNOSIS — Z841 Family history of disorders of kidney and ureter: Secondary | ICD-10-CM | POA: Insufficient documentation

## 2020-10-31 DIAGNOSIS — Z833 Family history of diabetes mellitus: Secondary | ICD-10-CM | POA: Insufficient documentation

## 2020-10-31 DIAGNOSIS — W19XXXA Unspecified fall, initial encounter: Secondary | ICD-10-CM | POA: Diagnosis not present

## 2020-10-31 DIAGNOSIS — Z794 Long term (current) use of insulin: Secondary | ICD-10-CM | POA: Insufficient documentation

## 2020-10-31 DIAGNOSIS — Z885 Allergy status to narcotic agent status: Secondary | ICD-10-CM | POA: Insufficient documentation

## 2020-10-31 DIAGNOSIS — Z8249 Family history of ischemic heart disease and other diseases of the circulatory system: Secondary | ICD-10-CM | POA: Insufficient documentation

## 2020-10-31 DIAGNOSIS — Z4789 Encounter for other orthopedic aftercare: Secondary | ICD-10-CM | POA: Diagnosis not present

## 2020-10-31 DIAGNOSIS — E119 Type 2 diabetes mellitus without complications: Secondary | ICD-10-CM | POA: Insufficient documentation

## 2020-10-31 DIAGNOSIS — I10 Essential (primary) hypertension: Secondary | ICD-10-CM | POA: Diagnosis not present

## 2020-10-31 DIAGNOSIS — Z7951 Long term (current) use of inhaled steroids: Secondary | ICD-10-CM | POA: Insufficient documentation

## 2020-10-31 DIAGNOSIS — S82842A Displaced bimalleolar fracture of left lower leg, initial encounter for closed fracture: Secondary | ICD-10-CM | POA: Diagnosis not present

## 2020-10-31 DIAGNOSIS — Z419 Encounter for procedure for purposes other than remedying health state, unspecified: Secondary | ICD-10-CM

## 2020-10-31 DIAGNOSIS — S93422A Sprain of deltoid ligament of left ankle, initial encounter: Secondary | ICD-10-CM | POA: Diagnosis not present

## 2020-10-31 DIAGNOSIS — G8918 Other acute postprocedural pain: Secondary | ICD-10-CM | POA: Diagnosis not present

## 2020-10-31 DIAGNOSIS — Z825 Family history of asthma and other chronic lower respiratory diseases: Secondary | ICD-10-CM | POA: Insufficient documentation

## 2020-10-31 DIAGNOSIS — S8262XA Displaced fracture of lateral malleolus of left fibula, initial encounter for closed fracture: Secondary | ICD-10-CM | POA: Diagnosis not present

## 2020-10-31 DIAGNOSIS — S93432A Sprain of tibiofibular ligament of left ankle, initial encounter: Secondary | ICD-10-CM | POA: Diagnosis not present

## 2020-10-31 DIAGNOSIS — K219 Gastro-esophageal reflux disease without esophagitis: Secondary | ICD-10-CM | POA: Diagnosis not present

## 2020-10-31 HISTORY — DX: Sleep apnea, unspecified: G47.30

## 2020-10-31 HISTORY — PX: ORIF ANKLE FRACTURE: SHX5408

## 2020-10-31 HISTORY — DX: Bell's palsy: G51.0

## 2020-10-31 LAB — GLUCOSE, CAPILLARY
Glucose-Capillary: 89 mg/dL (ref 70–99)
Glucose-Capillary: 106 mg/dL — ABNORMAL HIGH (ref 70–99)

## 2020-10-31 SURGERY — OPEN REDUCTION INTERNAL FIXATION (ORIF) ANKLE FRACTURE
Anesthesia: General | Site: Ankle | Laterality: Left

## 2020-10-31 MED ORDER — ONDANSETRON HCL 4 MG/2ML IJ SOLN
INTRAMUSCULAR | Status: AC
  Filled 2020-10-31: qty 2

## 2020-10-31 MED ORDER — MEPERIDINE HCL 25 MG/ML IJ SOLN: 6.2500 mg | INTRAMUSCULAR | Status: DC | PRN

## 2020-10-31 MED ORDER — OXYCODONE HCL 5 MG PO TABS: 5.0000 mg | ORAL_TABLET | Freq: Four times a day (QID) | ORAL | 0 refills | Status: AC | PRN

## 2020-10-31 MED ORDER — ONDANSETRON HCL 4 MG/2ML IJ SOLN
INTRAMUSCULAR | Status: DC | PRN
  Administered 2020-10-31: 4 mg via INTRAVENOUS

## 2020-10-31 MED ORDER — FENTANYL CITRATE (PF) 100 MCG/2ML IJ SOLN
INTRAMUSCULAR | Status: AC
  Filled 2020-10-31: qty 2

## 2020-10-31 MED ORDER — PROPOFOL 10 MG/ML IV BOLUS
INTRAVENOUS | Status: DC | PRN
  Administered 2020-10-31: 200 mg via INTRAVENOUS
  Administered 2020-10-31: 50 mg via INTRAVENOUS

## 2020-10-31 MED ORDER — DEXAMETHASONE SODIUM PHOSPHATE 10 MG/ML IJ SOLN
INTRAMUSCULAR | Status: DC | PRN
  Administered 2020-10-31: 5 mg via INTRAVENOUS

## 2020-10-31 MED ORDER — OXYCODONE HCL 5 MG PO TABS: 5.0000 mg | ORAL_TABLET | Freq: Four times a day (QID) | ORAL | 0 refills | Status: DC | PRN

## 2020-10-31 MED ORDER — MIDAZOLAM HCL 2 MG/2ML IJ SOLN
INTRAMUSCULAR | Status: AC
  Filled 2020-10-31: qty 2

## 2020-10-31 MED ORDER — ACETAMINOPHEN 500 MG PO TABS
1000.0000 mg | ORAL_TABLET | Freq: Once | ORAL | Status: AC
  Administered 2020-10-31: 1000 mg via ORAL

## 2020-10-31 MED ORDER — CEFAZOLIN SODIUM-DEXTROSE 2-4 GM/100ML-% IV SOLN
2.0000 g | INTRAVENOUS | Status: AC
  Administered 2020-10-31: 2 g via INTRAVENOUS

## 2020-10-31 MED ORDER — ACETAMINOPHEN 500 MG PO TABS
ORAL_TABLET | ORAL | Status: AC
  Filled 2020-10-31: qty 2

## 2020-10-31 MED ORDER — LIDOCAINE 2% (20 MG/ML) 5 ML SYRINGE
INTRAMUSCULAR | Status: AC
  Filled 2020-10-31: qty 5

## 2020-10-31 MED ORDER — FENTANYL CITRATE (PF) 100 MCG/2ML IJ SOLN
INTRAMUSCULAR | Status: DC | PRN
  Administered 2020-10-31: 25 ug via INTRAVENOUS

## 2020-10-31 MED ORDER — PROPOFOL 10 MG/ML IV BOLUS
INTRAVENOUS | Status: AC
  Filled 2020-10-31: qty 20

## 2020-10-31 MED ORDER — BUPIVACAINE-EPINEPHRINE (PF) 0.5% -1:200000 IJ SOLN
INTRAMUSCULAR | Status: DC | PRN
  Administered 2020-10-31: 30 mL via PERINEURAL

## 2020-10-31 MED ORDER — ROPIVACAINE HCL 7.5 MG/ML IJ SOLN
INTRAMUSCULAR | Status: DC | PRN
  Administered 2020-10-31: 20 mL via PERINEURAL

## 2020-10-31 MED ORDER — HYDROMORPHONE HCL 1 MG/ML IJ SOLN: 0.2500 mg | INTRAMUSCULAR | Status: DC | PRN

## 2020-10-31 MED ORDER — CEFAZOLIN SODIUM-DEXTROSE 2-4 GM/100ML-% IV SOLN
INTRAVENOUS | Status: AC
  Filled 2020-10-31: qty 100

## 2020-10-31 MED ORDER — LACTATED RINGERS IV SOLN: INTRAVENOUS | Status: DC

## 2020-10-31 MED ORDER — SCOPOLAMINE 1 MG/3DAYS TD PT72
1.0000 | MEDICATED_PATCH | TRANSDERMAL | Status: DC
  Administered 2020-10-31: 1.5 mg via TRANSDERMAL

## 2020-10-31 MED ORDER — PROMETHAZINE HCL 25 MG/ML IJ SOLN: 6.2500 mg | INTRAMUSCULAR | Status: DC | PRN

## 2020-10-31 MED ORDER — EPHEDRINE 5 MG/ML INJ
INTRAVENOUS | Status: AC
  Filled 2020-10-31: qty 10

## 2020-10-31 MED ORDER — DEXAMETHASONE SODIUM PHOSPHATE 10 MG/ML IJ SOLN
INTRAMUSCULAR | Status: AC
  Filled 2020-10-31: qty 1

## 2020-10-31 MED ORDER — EPHEDRINE SULFATE 50 MG/ML IJ SOLN
INTRAMUSCULAR | Status: DC | PRN
  Administered 2020-10-31 (×2): 10 mg via INTRAVENOUS
  Administered 2020-10-31: 5 mg via INTRAVENOUS

## 2020-10-31 MED ORDER — SCOPOLAMINE 1 MG/3DAYS TD PT72
MEDICATED_PATCH | TRANSDERMAL | Status: AC
  Filled 2020-10-31: qty 1

## 2020-10-31 MED ORDER — LIDOCAINE HCL (CARDIAC) PF 100 MG/5ML IV SOSY
PREFILLED_SYRINGE | INTRAVENOUS | Status: DC | PRN
Start: 2020-10-31 — End: 2020-10-31
  Administered 2020-10-31: 20 mg via INTRATRACHEAL

## 2020-10-31 MED ORDER — MIDAZOLAM HCL 2 MG/2ML IJ SOLN
1.0000 mg | Freq: Once | INTRAMUSCULAR | Status: AC
  Administered 2020-10-31: 2 mg via INTRAVENOUS

## 2020-10-31 MED ORDER — MIDAZOLAM HCL 2 MG/2ML IJ SOLN: 0.5000 mg | Freq: Once | INTRAMUSCULAR | Status: DC | PRN

## 2020-10-31 MED ORDER — FENTANYL CITRATE (PF) 100 MCG/2ML IJ SOLN
50.0000 ug | Freq: Once | INTRAMUSCULAR | Status: AC
  Administered 2020-10-31: 100 ug via INTRAVENOUS

## 2020-10-31 MED FILL — oxyCODONE HCL 5 MG TABS: 5 | 7 days supply | Qty: 30 | Fill #0

## 2020-10-31 SURGICAL SUPPLY — 70 items
BANDAGE ESMARK 6X9 LF (GAUZE/BANDAGES/DRESSINGS) ×1 IMPLANT
BENZOIN TINCTURE PRP APPL 2/3 (GAUZE/BANDAGES/DRESSINGS) IMPLANT
BIT DRILL 2 CANN GRADUATED (BIT) ×2 IMPLANT
BIT DRILL 2.5 CANN LNG (BIT) ×2 IMPLANT
BLADE SURG 15 STRL LF DISP TIS (BLADE) ×2 IMPLANT
BLADE SURG 15 STRL SS (BLADE) ×2
BNDG COHESIVE 4X5 TAN STRL (GAUZE/BANDAGES/DRESSINGS) IMPLANT
BNDG ELASTIC 6X5.8 VLCR STR LF (GAUZE/BANDAGES/DRESSINGS) ×4 IMPLANT
BNDG ESMARK 6X9 LF (GAUZE/BANDAGES/DRESSINGS) ×2
CHLORAPREP W/TINT 26 (MISCELLANEOUS) ×2 IMPLANT
COVER BACK TABLE 60X90IN (DRAPES) ×2 IMPLANT
COVER WAND RF STERILE (DRAPES) IMPLANT
CUFF TOURN SGL QUICK 34 (TOURNIQUET CUFF) ×1
CUFF TRNQT CYL 34X4.125X (TOURNIQUET CUFF) ×1 IMPLANT
DECANTER SPIKE VIAL GLASS SM (MISCELLANEOUS) IMPLANT
DRAPE C-ARM 42X72 X-RAY (DRAPES) ×2 IMPLANT
DRAPE C-ARMOR (DRAPES) ×2 IMPLANT
DRAPE EXTREMITY T 121X128X90 (DISPOSABLE) ×2 IMPLANT
DRAPE IMP U-DRAPE 54X76 (DRAPES) ×2 IMPLANT
DRAPE U-SHAPE 47X51 STRL (DRAPES) ×2 IMPLANT
ELECT REM PT RETURN 9FT ADLT (ELECTROSURGICAL) ×2
ELECTRODE REM PT RTRN 9FT ADLT (ELECTROSURGICAL) ×1 IMPLANT
GAUZE SPONGE 4X4 12PLY STRL (GAUZE/BANDAGES/DRESSINGS) ×2 IMPLANT
GAUZE XEROFORM 1X8 LF (GAUZE/BANDAGES/DRESSINGS) ×2 IMPLANT
GLOVE SRG 8 PF TXTR STRL LF DI (GLOVE) ×1 IMPLANT
GLOVE SURG ENC TEXT LTX SZ7.5 (GLOVE) ×2 IMPLANT
GLOVE SURG UNDER POLY LF SZ7 (GLOVE) ×2 IMPLANT
GLOVE SURG UNDER POLY LF SZ8 (GLOVE) ×1
GOWN STRL REUS W/ TWL LRG LVL3 (GOWN DISPOSABLE) IMPLANT
GOWN STRL REUS W/ TWL XL LVL3 (GOWN DISPOSABLE) ×2 IMPLANT
GOWN STRL REUS W/TWL LRG LVL3 (GOWN DISPOSABLE)
GOWN STRL REUS W/TWL XL LVL3 (GOWN DISPOSABLE) ×2
KIT SUTURETAK 2.4 DRILL BIT (KITS) ×2 IMPLANT
NS IRRIG 1000ML POUR BTL (IV SOLUTION) ×2 IMPLANT
PACK BASIN DAY SURGERY FS (CUSTOM PROCEDURE TRAY) ×2 IMPLANT
PAD CAST 4YDX4 CTTN HI CHSV (CAST SUPPLIES) ×1 IMPLANT
PADDING CAST COTTON 4X4 STRL (CAST SUPPLIES) ×1
PADDING CAST SYNTHETIC 4 (CAST SUPPLIES) ×2
PADDING CAST SYNTHETIC 4X4 STR (CAST SUPPLIES) ×2 IMPLANT
PENCIL SMOKE EVACUATOR (MISCELLANEOUS) ×2 IMPLANT
PLATE LOCK DP TI LT 4H (Plate) ×2 IMPLANT
SCREW CORTICAL 3MMX16MM (Screw) ×2 IMPLANT
SCREW LOCK TI QF 3X12 (Screw) ×2 IMPLANT
SCREW LOCK TI QF 3X14 (Screw) ×4 IMPLANT
SCREW LP 3.5X12MM (Screw) ×4 IMPLANT
SCREW LP TI 3.5X14MM (Screw) ×2 IMPLANT
SHEET MEDIUM DRAPE 40X70 STRL (DRAPES) ×2 IMPLANT
SLEEVE SCD COMPRESS KNEE MED (MISCELLANEOUS) ×2 IMPLANT
SPLINT FAST PLASTER 5X30 (CAST SUPPLIES) ×20
SPLINT PLASTER CAST FAST 5X30 (CAST SUPPLIES) ×20 IMPLANT
SPONGE LAP 18X18 RF (DISPOSABLE) ×2 IMPLANT
STAPLER VISISTAT 35W (STAPLE) ×2 IMPLANT
STOCKINETTE 6  STRL (DRAPES) ×1
STOCKINETTE 6 STRL (DRAPES) ×1 IMPLANT
STRIP CLOSURE SKIN 1/2X4 (GAUZE/BANDAGES/DRESSINGS) IMPLANT
SUCTION FRAZIER HANDLE 10FR (MISCELLANEOUS) ×1
SUCTION TUBE FRAZIER 10FR DISP (MISCELLANEOUS) ×1 IMPLANT
SUT ETHILON 3 0 PS 1 (SUTURE) ×2 IMPLANT
SUT MNCRL AB 3-0 PS2 18 (SUTURE) ×4 IMPLANT
SUT PDS AB 2-0 CT2 27 (SUTURE) ×2 IMPLANT
SUT VIC AB 2-0 SH 27 (SUTURE)
SUT VIC AB 2-0 SH 27XBRD (SUTURE) IMPLANT
SUT VIC AB 3-0 FS2 27 (SUTURE) IMPLANT
SUTURE TAPE 3.0 DBL LOAD S-TAK (Anchor) ×2 IMPLANT
SUTURETAPE 3.0 DBL LOAD S-TAK (Anchor) ×4 IMPLANT
SYNDESMOSIS TIGHTROPE XP (Orthopedic Implant) ×2 IMPLANT
SYR BULB EAR ULCER 3OZ GRN STR (SYRINGE) ×2 IMPLANT
TOWEL GREEN STERILE FF (TOWEL DISPOSABLE) ×4 IMPLANT
TUBE CONNECTING 20X1/4 (TUBING) ×2 IMPLANT
UNDERPAD 30X36 HEAVY ABSORB (UNDERPADS AND DIAPERS) ×2 IMPLANT

## 2020-10-31 NOTE — Transfer of Care (Signed)
Immediate Anesthesia Transfer of Care Note  Patient: Shelley Mccoy  Procedure(s) Performed: OPEN TREATMENT OF LEFT LATERAL MALLEOLUS, DELTOID LIGAMENT RECONSTRUCTION AND SYNDESMOSIS (Left Ankle)  Patient Location: PACU  Anesthesia Type:General  Level of Consciousness: drowsy, patient cooperative and responds to stimulation  Airway & Oxygen Therapy: Patient Spontanous Breathing and Patient connected to face mask oxygen  Post-op Assessment: Report given to RN and Post -op Vital signs reviewed and stable  Post vital signs: Reviewed and stable  Last Vitals:  Vitals Value Taken Time  BP 142/79 10/31/20 1301  Temp    Pulse 78 10/31/20 1302  Resp 20 10/31/20 1302  SpO2 100 % 10/31/20 1302  Vitals shown include unvalidated device data.  Last Pain:  Vitals:   10/31/20 1009  TempSrc: Oral  PainSc: 7       Patients Stated Pain Goal: 5 (10/31/20 1009)  Complications: No complications documented.

## 2020-10-31 NOTE — Discharge Instructions (Signed)
DR. Susa Simmonds FOOT & ANKLE SURGERY POST-OP INSTRUCTIONS   Pain Management 1. The numbing medicine and your leg will last around 18 hours, take a dose of your pain medicine as soon as you feel it wearing off to avoid rebound pain. 2. Keep your foot elevated above heart level.  Make sure that your heel hangs free ('floats'). 3. Take all prescribed medication as directed. 4. If taking narcotic pain medication you may want to use an over-the-counter stool softener to avoid constipation. 5. You may take over-the-counter NSAIDs (ibuprofen, naproxen, etc.) as well as over-the-counter acetaminophen as directed on the packaging as a supplement for your pain and may also use it to wean away from the prescription medication.  Activity ? Non-weightbearing ? Keep splint intact  First Postoperative Visit 1. Your first postop visit will be at least 2 weeks after surgery.  This should be scheduled when you schedule surgery. 2. If you do not have a postoperative visit scheduled please call [phone] to schedule an appointment. 3. At the appointment your incision will be evaluated for suture removal, x-rays will be obtained if necessary.  General Instructions 1. Swelling is very common after foot and ankle surgery.  It often takes 3 months for the foot and ankle to begin to feel comfortable.  Some amount of swelling will persist for 6-12 months. 2. DO NOT change the dressing.  If there is a problem with the dressing (too tight, loose, gets wet, etc.) please contact Dr. Donnie Mesa office. 3. DO NOT get the dressing wet.  For showers you can use an over-the-counter cast cover or wrap a washcloth around the top of your dressing and then cover it with a plastic bag and tape it to your leg. 4. DO NOT soak the incision (no tubs, pools, bath, etc.) until you have approval from Dr. Susa Simmonds.  Contact Dr. Garret Reddish office or go to Emergency Room if: 1. Temperature above 101 F. 2. Increasing pain that is unresponsive to pain  medication or elevation 3. Excessive redness or swelling in your foot 4. Dressing problems - excessive bloody drainage, looseness or tightness, or if dressing gets wet 5. Develop pain, swelling, warmth, or discoloration of your calf  May take Tylenol after 5pm, if needed.    Post Anesthesia Home Care Instructions  Activity: Get plenty of rest for the remainder of the day. A responsible individual must stay with you for 24 hours following the procedure.  For the next 24 hours, DO NOT: -Drive a car -Advertising copywriter -Drink alcoholic beverages -Take any medication unless instructed by your physician -Make any legal decisions or sign important papers.  Meals: Start with liquid foods such as gelatin or soup. Progress to regular foods as tolerated. Avoid greasy, spicy, heavy foods. If nausea and/or vomiting occur, drink only clear liquids until the nausea and/or vomiting subsides. Call your physician if vomiting continues.  Special Instructions/Symptoms: Your throat may feel dry or sore from the anesthesia or the breathing tube placed in your throat during surgery. If this causes discomfort, gargle with warm salt water. The discomfort should disappear within 24 hours.  If you had a scopolamine patch placed behind your ear for the management of post- operative nausea and/or vomiting:  1. The medication in the patch is effective for 72 hours, after which it should be removed.  Wrap patch in a tissue and discard in the trash. Wash hands thoroughly with soap and water. 2. You may remove the patch earlier than 72 hours if you experience  unpleasant side effects which may include dry mouth, dizziness or visual disturbances. 3. Avoid touching the patch. Wash your hands with soap and water after contact with the patch.    Regional Anesthesia Blocks  1. Numbness or the inability to move the "blocked" extremity may last from 3-48 hours after placement. The length of time depends on the medication  injected and your individual response to the medication. If the numbness is not going away after 48 hours, call your surgeon.  2. The extremity that is blocked will need to be protected until the numbness is gone and the  Strength has returned. Because you cannot feel it, you will need to take extra care to avoid injury. Because it may be weak, you may have difficulty moving it or using it. You may not know what position it is in without looking at it while the block is in effect.  3. For blocks in the legs and feet, returning to weight bearing and walking needs to be done carefully. You will need to wait until the numbness is entirely gone and the strength has returned. You should be able to move your leg and foot normally before you try and bear weight or walk. You will need someone to be with you when you first try to ensure you do not fall and possibly risk injury.  4. Bruising and tenderness at the needle site are common side effects and will resolve in a few days.  5. Persistent numbness or new problems with movement should be communicated to the surgeon or the [facility] [phone] [facility] [phone]).

## 2020-10-31 NOTE — Progress Notes (Signed)
Assisted Dr. Carswell Jackson with left, ultrasound guided, popliteal, adductor canal block. Side rails up, monitors on throughout procedure. See vital signs in flow sheet. Tolerated Procedure well. 

## 2020-10-31 NOTE — Anesthesia Procedure Notes (Signed)
Procedure Name: LMA Insertion Date/Time: 10/31/2020 11:33 AM Performed by: Thornell Mule, CRNA Pre-anesthesia Checklist: Patient identified, Emergency Drugs available, Suction available and Patient being monitored Patient Re-evaluated:Patient Re-evaluated prior to induction Oxygen Delivery Method: Circle system utilized Preoxygenation: Pre-oxygenation with 100% oxygen Induction Type: IV induction Ventilation: Mask ventilation without difficulty LMA: LMA inserted LMA Size: 4.0 Number of attempts: 1 Placement Confirmation: positive ETCO2 and ETT inserted through vocal cords under direct vision Tube secured with: Tape Dental Injury: Teeth and Oropharynx as per pre-operative assessment

## 2020-10-31 NOTE — Anesthesia Procedure Notes (Signed)
Anesthesia Regional Block: Popliteal block   Pre-Anesthetic Checklist: ,, timeout performed, Correct Patient, Correct Site, Correct Laterality, Correct Procedure, Correct Position, site marked, Risks and benefits discussed,  Surgical consent,  Pre-op evaluation,  At surgeon's request and post-op pain management  Laterality: Left  Prep: chloraprep       Needles:  Injection technique: Single-shot  Needle Type: Echogenic Needle     Needle Length: 9cm  Needle Gauge: 21     Additional Needles:   Procedures:,,,, ultrasound used (permanent image in chart),,,,  Narrative:  Start time: 10/31/2020 11:01 AM End time: 10/31/2020 11:08 AM Injection made incrementally with aspirations every 5 mL.  Performed by: Personally  Anesthesiologist: Jairo Ben, MD  Additional Notes: Pt identified in Holding room.  Monitors applied. Working IV access confirmed. Sterile prep L lateral knee.  #21ga ECHOgenic Arrow block needle to sciatic nerve in pop fossa with US guidance.  30cc 0.5% Bupivacaine with 1:200k epi injected incrementally after negative test dose.  Patient asymptomatic, VSS, no heme aspirated, tolerated well.  Sandford Craze, MD

## 2020-10-31 NOTE — Anesthesia Preprocedure Evaluation (Addendum)
Anesthesia Evaluation  Patient identified by MRN, date of birth, ID band Patient awake    Reviewed: Allergy & Precautions, NPO status , Patient's Chart, lab work & pertinent test results  History of Anesthesia Complications Negative for: history of anesthetic complications  Airway Mallampati: II  TM Distance: >3 FB Neck ROM: Full    Dental  (+) Teeth Intact, Dental Advisory Given   Pulmonary asthma , sleep apnea (does not use CPAP) , COPD,  COPD inhaler,  10/28/2020 SARS coronavirus NEG   breath sounds clear to auscultation       Cardiovascular hypertension, Pt. on medications  Rhythm:Regular Rate:Normal     Neuro/Psych negative neurological ROS     GI/Hepatic Neg liver ROS, GERD  Medicated and Controlled,  Endo/Other  diabetes (glu 89), Insulin DependentMorbid obesity  Renal/GU negative Renal ROS     Musculoskeletal   Abdominal (+) + obese,   Peds  Hematology negative hematology ROS (+)   Anesthesia Other Findings   Reproductive/Obstetrics                            Anesthesia Physical Anesthesia Plan  ASA: III  Anesthesia Plan: General   Post-op Pain Management: GA combined w/ Regional for post-op pain   Induction: Intravenous  PONV Risk Score and Plan: 3 and Ondansetron, Dexamethasone and Scopolamine patch - Pre-op  Airway Management Planned: LMA  Additional Equipment: None  Intra-op Plan:   Post-operative Plan:   Informed Consent: I have reviewed the patients History and Physical, chart, labs and discussed the procedure including the risks, benefits and alternatives for the proposed anesthesia with the patient or authorized representative who has indicated his/her understanding and acceptance.     Dental advisory given  Plan Discussed with: CRNA and Surgeon  Anesthesia Plan Comments: (Plan routine monitors, GA with adductor canal and popliteal blocks for post op  analgesia)       Anesthesia Quick Evaluation

## 2020-10-31 NOTE — Anesthesia Postprocedure Evaluation (Signed)
Anesthesia Post Note  Patient: Roanna Raider  Procedure(s) Performed: OPEN TREATMENT OF LEFT LATERAL MALLEOLUS, DELTOID LIGAMENT RECONSTRUCTION AND SYNDESMOSIS (Left Ankle)     Patient location during evaluation: PACU Anesthesia Type: General and Regional Level of consciousness: awake and alert, patient cooperative and oriented Pain management: pain level controlled Vital Signs Assessment: post-procedure vital signs reviewed and stable Respiratory status: spontaneous breathing, nonlabored ventilation and respiratory function stable Cardiovascular status: blood pressure returned to baseline and stable Postop Assessment: no apparent nausea or vomiting and adequate PO intake Anesthetic complications: no   No complications documented.  Last Vitals:  Vitals:   10/31/20 1315 10/31/20 1400  BP: 131/89 (!) 146/77  Pulse: 85 91  Resp: (!) 22 18  Temp:  36.6 C  SpO2: 100% 100%    Last Pain:  Vitals:   10/31/20 1344  TempSrc:   PainSc: 0-No pain                 Joseth Weigel,E. Torian Thoennes

## 2020-10-31 NOTE — Op Note (Signed)
Shelley Mccoy female 50 y.o. 10/31/2020  PreOperative Diagnosis: Left lateral malleolus fracture Deltoid ligament disruption  PostOperative Diagnosis: Left lateral malleolus fracture Syndesmosis disruption Deltoid ligament disruption  PROCEDURE: Open treatment of lateral malleolus Open treatment of syndesmosis Deltoid ligament reconstruction Ankle stress view fluoroscopy  SURGEON: Dub Mikes, MD  ASSISTANT: None  ANESTHESIA: General LMA with peripheral nerve blockade  FINDINGS: Displaced lateral malleolus fracture with syndesmotic disruption and deltoid ligament disruption stress testing  IMPLANTS: Distal fibular locking plate, Arthrex Tight rope Suture tack  INDICATIONS:49 y.o. female had a fall and sustained the above injuries.  She was seen in the office where there is medial clear space widening and fracture that was displaced of her lateral malleolus.  She was splinted in the ER and sent to me for evaluation.  Given the amount of displacement and instability she was indicated for surgery.   Patient understood the risks, benefits and alternatives to surgery which include but are not limited to wound healing complications, infection, nonunion, malunion, need for further surgery as well as damage to surrounding structures. They also understood the potential for continued pain in that there were no guarantees of acceptable outcome After weighing these risks the patient opted to proceed with surgery.  PROCEDURE: Patient was identified in the preoperative holding area.  The left leg was marked by myself.  Consent was signed by myself and the patient.  Block was performed by anesthesia in the preoperative holding area.  Patient was taken to the operative suite and placed supine on the operative table.  General LMA anesthesia was induced without difficulty. Bump was placed under the operative hip and bone foam was used.  All bony prominences were well padded.  Tourniquet  was placed on the operative thigh.  Preoperative antibiotics were given. The extremity was prepped and draped in the usual sterile fashion and surgical timeout was performed.  The limb was elevated and the tourniquet was inflated to 250 mmHg.  We began by making a longitudinal incision overlying the distal fibula.  This was taken sharply down through skin and subcutaneous tissue.  Blunt dissection was used to identify any branch of the superficial peroneal nerve which was not identified within the surgical field.  The incision was then taken sharply down to bone and the fracture site was identified.  The fracture site was mobilized.   The fracture site were cleaned with a rondure and curette of any fracture hematoma and callus formation.  Then the fracture of the fibula was reduced under direct visualization and held provisionally with a lobster claw.  Then fluoroscopy confirmed adequate reduction of the ankle mortise at that time.  Then a combination of locking and nonlocking screws were used. This provided good stability of the distal fibula fracture.  Then using stress view fluoroscopy it was found that there was instability and widening of the syndesmosis with syndesmotic stress as well as widening of the medial clear space.  It was decided to perform open treatment of the syndesmosis.    Separate deep incision was created anterior to the distal fibula to gain access to the syndesmosis.  The syndesmotic liver movements were grossly disrupted.  There is instability of the syndesmosis.  Then under direct visualization syndesmosis was reduced.  A Weber clamp was placed across the syndesmosis.  Then a single tight rope was placed through the plate across the syndesmosis and tibia.  This was done in standard fashion.  After placement of the tight rope the ankle was stressed  and found to be stable with regard to the syndesmosis.  It appeared acceptably reduced on fluoroscopy.    Then talar tilt testing was  performed using fluoroscopy.  There is notable continued medial talar tilt with widening of the medial space during stress.  This represented incompetence of the deltoid ligament.  An incision was made overlying the medial malleolus and deltoid ligament..  This was taken sharply down through skin and subcutaneous tissue.  Bovie cautery was used for skin bleeders.  Skin flaps were created anteriorly and posteriorly.  There is hematoma in this area and obvious renting of the deltoid ligament.  We able to gain direct access to the medial gutter of the ankle.  This was inspected and found to be without cartilage damage.  The ankle joint was irrigated and hematoma was removed.  Then the distal aspect of the medial malleolus was cleared of scar tissue using a rondure.  The distal cortical aspect of the medial malleolus was removed as well.  Then using a drill a single suture tack was placed within the medial malleolus and the suture tack was placed.  The disrupted ligamentous tissue was then reconstructed back to the medial malleolus.  This was done using a mattress suture technique.  Then under fluoroscopy the ankle was stressed further and found to be stable with regard to talar tilt, medial clear space widening or syndesmotic widening.  The wounds were irrigated and the deep tissue and subcuticular tissue was closed with 3-0 Monocryl and the skin with staples.  Xeroform placed on the wounds as well as 4 x 4's and sterile sheet cotton.  She was placed in a nonweightbearing short leg splint.  She tolerated this well.  There were no complications.  She was awakened from anesthesia and taken recovery in stable condition.  POST OPERATIVE INSTRUCTIONS: Nonweightbearing on operative extremity Keep splint dry and limb elevated Continue 325 mg aspirin for DVT prophylaxis Call the office with concerns She will follow-up in 2 weeks for splint removal, x-rays of the operative ankle, nonweightbearing and suture removal if  appropriate.  TOURNIQUET TIME:66 mins  BLOOD LOSS:  Minimal         DRAINS: none         SPECIMEN: none       COMPLICATIONS:  * No complications entered in OR log *         Disposition: PACU - hemodynamically stable.         Condition: stable

## 2020-10-31 NOTE — H&P (Signed)
PREOPERATIVE H&P  Chief Complaint: Ankle pain, left  HPI: Shelley Mccoy is a 50 y.o. female who presents for preoperative history and physical with a diagnosis of left lateral malleolus fracture with medial clear space widening after she sustained this injury in a fall..  She is here today for surgical intervention.  She has an stable ankle fracture and is indicated for surgery.  Symptoms are rated as moderate to severe, and have been worsening.  This is significantly impairing activities of daily living.  She has elected for surgical management.   Past Medical History:  Diagnosis Date  . Anemia   . Angioedema of lips 03/22/2019  . Asthma   . Bell's palsy   . Chronic urticaria 03/22/2019  . Conversion disorder   . Diabetes mellitus without complication (HCC)   . Functional gait abnormality   . Heart murmur   . History of uterine fibroid   . HSV-2 (herpes simplex virus 2) infection   . Hypertension   . Seasonal allergies   . Sleep apnea    no cpap   Past Surgical History:  Procedure Laterality Date  . CYSTECTOMY     hand and breast  . LAPAROSCOPIC ABDOMINAL EXPLORATION    . TOTAL ABDOMINAL HYSTERECTOMY     Social History   Socioeconomic History  . Marital status: Single    Spouse name: Not on file  . Number of children: 3  . Years of education: college  . Highest education level: Associate degree: academic program  Occupational History  . Occupation: CNA  Tobacco Use  . Smoking status: Never Smoker  . Smokeless tobacco: Never Used  Vaping Use  . Vaping Use: Never used  Substance and Sexual Activity  . Alcohol use: No  . Drug use: No  . Sexual activity: Yes    Birth control/protection: Surgical  Other Topics Concern  . Not on file  Social History Narrative   Lives alone.   Right-handed.   No daily use of caffeine.   Social Determinants of Health   Financial Resource Strain: Not on file  Food Insecurity: Not on file  Transportation Needs: Not on file   Physical Activity: Not on file  Stress: Not on file  Social Connections: Not on file   Family History  Problem Relation Age of Onset  . Diabetes Mother   . Congestive Heart Failure Mother   . Kidney failure Mother   . Hypertension Mother   . Asthma Mother   . Prostate cancer Father   . Hypertension Sister   . Diabetes Maternal Grandmother   . Congestive Heart Failure Maternal Grandmother   . Kidney failure Maternal Grandmother   . Alzheimer's disease Paternal Grandmother    Allergies  Allergen Reactions  . Percocet [Oxycodone-Acetaminophen] Hives   Prior to Admission medications   Medication Sig Start Date End Date Taking? Authorizing Provider  albuterol (VENTOLIN HFA) 108 (90 Base) MCG/ACT inhaler Inhale 2 puffs into the lungs every 6 (six) hours as needed for wheezing or shortness of breath. 07/25/20  Yes Padgett, Pilar Grammes, MD  Ascorbic Acid (VITAMIN C PO) Take 1 tablet by mouth daily.   Yes [provider]  cetirizine (ZYRTEC ALLERGY) 10 MG tablet Take 1 tablet (10 mg total) by mouth 2 (two) times daily. 07/25/20  Yes Marcelyn Bruins, MD  conjugated estrogens (PREMARIN) vaginal cream Place 1 Applicatorful vaginally daily. 08/10/19  Yes Constant, Peggy, MD  famotidine (PEPCID) 20 MG tablet Take 1 tablet (20 mg total) by  mouth 2 (two) times daily. 07/25/20  Yes Padgett, Pilar Grammes, MD  Ferrous Sulfate (IRON PO) Take 1 tablet by mouth daily.   Yes [provider]  fluticasone (FLONASE) 50 MCG/ACT nasal spray 1 spray per nostril 1-2 times daily as needed for nasal congestion. 07/25/20  Yes Padgett, Pilar Grammes, MD  fluticasone-salmeterol (ADVAIR HFA) 2186118591 MCG/ACT inhaler Inhale 2 puffs into the lungs 2 (two) times daily as needed (for flares). 07/26/20  Yes Padgett, Pilar Grammes, MD  gabapentin (NEURONTIN) 300 MG capsule Take 300 mg by mouth daily.    Yes [provider]  hydrochlorothiazide (HYDRODIURIL) 12.5 MG tablet  Take 12.5 mg by mouth daily.   Yes [provider]  insulin degludec (TRESIBA) 100 UNIT/ML FlexTouch Pen Inject 10 Units into the skin daily.   Yes [provider]  ipratropium (ATROVENT) 0.03 % nasal spray Place 2 sprays into both nostrils 2 (two) times daily. 12/15/18  Yes [provider]  losartan (COZAAR) 50 MG tablet Take 50 mg by mouth daily.   Yes [provider]  omalizumab (XOLAIR) 150 MG injection INJECT 300 MG SUBCUTANEOUSLY EVERY 4 WEEKS. 08/09/20  Yes Quentin Angst, MD  Omega-3 Fatty Acids (FISH OIL) 1000 MG CAPS Take by mouth.   Yes [provider]  ondansetron (ZOFRAN) 4 MG tablet Take 4 mg by mouth every 8 (eight) hours as needed for nausea or vomiting.   Yes [provider]  oxyCODONE (ROXICODONE) 5 MG immediate release tablet Take 1 tablet (5 mg total) by mouth every 6 (six) hours as needed for up to 15 doses for breakthrough pain. 10/21/20  Yes Curatolo, Adam, DO  pravastatin (PRAVACHOL) 20 MG tablet Take 20 mg by mouth at bedtime.    Yes [provider]  sertraline (ZOLOFT) 50 MG tablet Take 50 mg by mouth at bedtime.    Yes [provider]  valACYclovir (VALTREX) 1000 MG tablet Take 1 tablet (1,000 mg total) by mouth 3 (three) times daily. 12/18/18  Yes Gerhard Munch, MD  VITAMIN D PO Take 1 tablet by mouth daily.   Yes [provider]  EPINEPHrine 0.3 mg/0.3 mL IJ SOAJ injection  04/19/19   [provider]     Positive ROS: All other systems have been reviewed and were otherwise negative with the exception of those mentioned in the HPI and as above.  Physical Exam:  Vitals:   10/31/20 1009  BP: (!) 160/83  Pulse: 78  Resp: 16  Temp: 97.6 F (36.4 C)  SpO2: 100%   General: Alert, no acute distress Cardiovascular: No pedal edema Respiratory: No cyanosis, no use of accessory musculature GI: No organomegaly, abdomen is soft and non-tender Skin: No lesions in the area of  chief complaint Neurologic: Sensation intact distally Psychiatric: Patient is competent for consent with normal mood and affect Lymphatic: No axillary or cervical lymphadenopathy  MUSCULOSKELETAL: Left ankle demonstrates swelling.  Tenderness palpation of the lateral malleolus.  Tenderness palpation medially over the deltoid ligament complex.  Did not assess range of motion stability due to fracture.  Sensation grossly intact distally.  Foot is warm and well-perfused.  Assessment: Left lateral malleolus fracture with medial clear space widening consistent with deltoid ligament tear.   Plan: Plan for open treatment of her lateral malleolus and assessment of stability.  Possible syndesmotic fixation or repair of her deltoid ligament based on findings.  We discussed the risks, benefits and alternatives of surgery which include but are not limited to wound healing  complications, infection, nonunion, malunion, need for further surgery, damage to surrounding structures and continued pain.  They understand there is no guarantees to an acceptable outcome.  After weighing these risks they opted to proceed with surgery.     Terance Hart, MD    10/31/2020 11:04 AM

## 2020-10-31 NOTE — Anesthesia Procedure Notes (Signed)
Anesthesia Regional Block: Adductor canal block   Pre-Anesthetic Checklist: ,, timeout performed, Correct Patient, Correct Site, Correct Laterality, Correct Procedure, Correct Position, site marked, Risks and benefits discussed,  Surgical consent,  Pre-op evaluation,  At surgeon's request and post-op pain management  Laterality: Left and Lower  Prep: chloraprep       Needles:  Injection technique: Single-shot  Needle Type: Echogenic Needle     Needle Length: 9cm  Needle Gauge: 21     Additional Needles:   Procedures:,,,, ultrasound used (permanent image in chart),,,,  Narrative:  Start time: 10/31/2020 10:51 AM End time: 10/31/2020 10:59 AM Injection made incrementally with aspirations every 5 mL.  Performed by: Personally  Anesthesiologist: Jairo Ben, MD  Additional Notes: Pt identified in Holding room.  Monitors applied. Working IV access confirmed. Sterile prep L thigh.  #21ga ECHOgenic Arrow block needle into adductor canal with US guidance.  20cc 0.75% Ropivacaine injected incrementally after negative test dose.  Patient asymptomatic, VSS, no heme aspirated, tolerated well.  Sandford Craze, MD

## 2020-11-01 ENCOUNTER — Encounter (HOSPITAL_BASED_OUTPATIENT_CLINIC_OR_DEPARTMENT_OTHER): Payer: Self-pay | Admitting: Orthopaedic Surgery

## 2020-11-11 ENCOUNTER — Other Ambulatory Visit: Payer: Self-pay

## 2020-11-11 ENCOUNTER — Ambulatory Visit (INDEPENDENT_AMBULATORY_CARE_PROVIDER_SITE_OTHER): Payer: 59 | Admitting: *Deleted

## 2020-11-11 DIAGNOSIS — L501 Idiopathic urticaria: Secondary | ICD-10-CM

## 2020-11-11 DIAGNOSIS — S82432A Displaced oblique fracture of shaft of left fibula, initial encounter for closed fracture: Secondary | ICD-10-CM | POA: Diagnosis not present

## 2020-11-11 DIAGNOSIS — L508 Other urticaria: Secondary | ICD-10-CM

## 2020-11-11 DIAGNOSIS — Z9889 Other specified postprocedural states: Secondary | ICD-10-CM | POA: Diagnosis not present

## 2020-11-12 DIAGNOSIS — F329 Major depressive disorder, single episode, unspecified: Secondary | ICD-10-CM | POA: Diagnosis not present

## 2020-11-12 DIAGNOSIS — G43909 Migraine, unspecified, not intractable, without status migrainosus: Secondary | ICD-10-CM | POA: Diagnosis not present

## 2020-11-12 DIAGNOSIS — J4520 Mild intermittent asthma, uncomplicated: Secondary | ICD-10-CM | POA: Diagnosis not present

## 2020-11-12 DIAGNOSIS — E119 Type 2 diabetes mellitus without complications: Secondary | ICD-10-CM | POA: Diagnosis not present

## 2020-11-12 DIAGNOSIS — I10 Essential (primary) hypertension: Secondary | ICD-10-CM | POA: Diagnosis not present

## 2020-11-12 DIAGNOSIS — J309 Allergic rhinitis, unspecified: Secondary | ICD-10-CM | POA: Diagnosis not present

## 2020-11-12 DIAGNOSIS — F410 Panic disorder [episodic paroxysmal anxiety] without agoraphobia: Secondary | ICD-10-CM | POA: Diagnosis not present

## 2020-11-12 DIAGNOSIS — K219 Gastro-esophageal reflux disease without esophagitis: Secondary | ICD-10-CM | POA: Diagnosis not present

## 2020-11-12 DIAGNOSIS — E785 Hyperlipidemia, unspecified: Secondary | ICD-10-CM | POA: Diagnosis not present

## 2020-11-15 ENCOUNTER — Encounter: Payer: 59 | Admitting: Allergy

## 2020-11-15 ENCOUNTER — Other Ambulatory Visit: Payer: Self-pay

## 2020-11-18 NOTE — Progress Notes (Signed)
Pt arrived at 1:50pm for 2:50pm appointment.  Pt left without being seen as did not want to wait till 2:50pm for scheduled appointment time.  This encounter was created in error - please disregard.

## 2020-11-26 NOTE — Progress Notes (Signed)
Follow Up Note  RE: Shelley Mccoy MRN: [id number] DOB: 01-Mar-1971 Date of Office Visit: 11/27/2020  Referring provider: Tylene Fantasia, * Primary care provider: Tylene Fantasia, MD  Chief Complaint: Asthma  History of Present Illness: I had the pleasure of seeing Shelley Mccoy for a follow up visit at the [facility] on 11/27/2020. She is a 50 y.o. female, who is being followed for chronic urticaria on Xolair 300 mg every 4 weeks, asthma and allergic rhinoconjunctivitis. Her previous allergy office visit was on 07/25/2020 with Dr. Selena Batten. Today is a regular follow up visit.  Chronic urticaria Currently on Xolair 300mg  every 4 weeks with good benefit. And no hives since started back on injections. Stopped zyrtec and famotidine with no flares.  Moderate persistent asthma  Currently on Advair 2 puffs twice a day with good benefit.  Using albuterol once a week with good benefit.  Denies any ER/urgent care visits or prednisone use since the last visit. Usually flares in the spring months.   Seasonal and perennial allergic rhinoconjunctivitis  Asymptomatic with no medications as she has been in the house more after her leg surgery.   Assessment and Plan: Shelley Mccoy is a 50 y.o. female with: Chronic urticaria Past history - Breaking out in rash/hives for the past 3 years.  Urticaria panel unremarkable.  Interim history - no hives since started on Xolair and has been able wean off oral antihistamines.  Continue Xolair injections 300mg  every 4 weeks.    If you have a flare you may restart zyrtec (cetirizine) 10mg  and Pepcid (famotidine) 20mg  twice a day.   Monitor symptoms.   Avoid the following potential triggers: alcohol, tight clothing, NSAIDs.  Moderate persistent asthma without complication Past history - Diagnosed with asthma 4 years ago.  2020 spirometry showed: possible restrictive disease and 21% improvement in FEV1 post bronchodilator  treatment.   Interim history - stable but concerned about symptoms flaring in the spring.   Today's spirometry showed some restriction.   ACT score 25.   The Xolair is most likely also helping with her asthma.   Daily controller medication(s):continue Advair 2 puffs twice a day with spacer and rinse mouth afterwards.  Prior to physical activity:May use albuterol rescue inhaler 2 puffs 5 to 15 minutes prior to strenuous physical activities.  Rescue medications:May use albuterol rescue inhaler 2 puffs or nebulizer every 4 to 6 hours as needed for shortness of breath, chest tightness, coughing, and wheezing. Monitor frequency of use.  Repeat spirometry at next visit.   Seasonal and perennial allergic rhinoconjunctivitis Past history - Rhinoconjunctivitis symptoms for the past 5 to 10 years mainly during the pollen seasons.  Using Allegra and Atrovent nasal spray with some benefit. 2020 skin testing showed: Positive to ragweed and dust mites, molds and cockroaches. Interim history - Stable.   Continue environmental control measures.  Continue Flonase 1 spray per nostril 1-2 times a day as needed for nasal congestion.   May use over the counter antihistamines such as Zyrtec (cetirizine), Claritin (loratadine), Allegra (fexofenadine), or Xyzal (levocetirizine) daily as needed.  Return in about 4 months (around 03/29/2021).  No orders of the defined types were placed in this encounter.  Lab Orders  No laboratory test(s) ordered today    Diagnostics: Spirometry:  Tracings reviewed. Her effort: Good reproducible efforts. FVC: 1.87L FEV1: 1.40L, 72% predicted FEV1/FVC ratio: 75% Interpretation: Spirometry consistent with possible restrictive disease.  Please see scanned spirometry results for details.  Medication  List:  Current Outpatient Medications  Medication Sig Dispense Refill  . albuterol (VENTOLIN HFA) 108 (90 Base) MCG/ACT inhaler Inhale 2 puffs into the lungs  every 6 (six) hours as needed for wheezing or shortness of breath. 18 g 1  . Ascorbic Acid (VITAMIN C PO) Take 1 tablet by mouth daily.    . cetirizine (ZYRTEC ALLERGY) 10 MG tablet Take 1 tablet (10 mg total) by mouth 2 (two) times daily. 60 tablet 5  . conjugated estrogens (PREMARIN) vaginal cream Place 1 Applicatorful vaginally daily. 42.5 g 12  . cyclobenzaprine (FLEXERIL) 10 MG tablet     . EPINEPHrine 0.3 mg/0.3 mL IJ SOAJ injection     . famotidine (PEPCID) 20 MG tablet Take 1 tablet (20 mg total) by mouth 2 (two) times daily. 60 tablet 5  . Ferrous Sulfate (IRON PO) Take 1 tablet by mouth daily.    . fluticasone (FLONASE) 50 MCG/ACT nasal spray 1 spray per nostril 1-2 times daily as needed for nasal congestion. 1 g 5  . fluticasone-salmeterol (ADVAIR HFA) 115-21 MCG/ACT inhaler Inhale 2 puffs into the lungs 2 (two) times daily as needed (for flares). 12 g 5  . gabapentin (NEURONTIN) 300 MG capsule Take 300 mg by mouth daily.     Marland Kitchen glipiZIDE (GLUCOTROL) 10 MG tablet Take 10 mg by mouth daily.    . hydrochlorothiazide (HYDRODIURIL) 12.5 MG tablet Take 12.5 mg by mouth daily.    Marland Kitchen ibuprofen (ADVIL) 800 MG tablet Take 800 mg by mouth 3 (three) times daily.    . insulin degludec (TRESIBA) 100 UNIT/ML FlexTouch Pen Inject 10 Units into the skin daily.    Marland Kitchen ipratropium (ATROVENT) 0.03 % nasal spray Place 2 sprays into both nostrils 2 (two) times daily.    Marland Kitchen losartan (COZAAR) 50 MG tablet Take 50 mg by mouth daily.    Marland Kitchen omalizumab (XOLAIR) 150 MG injection INJECT 300 MG SUBCUTANEOUSLY EVERY 4 WEEKS. 2 each 10  . Omega-3 Fatty Acids (FISH OIL) 1000 MG CAPS Take by mouth.    . ondansetron (ZOFRAN) 4 MG tablet Take 4 mg by mouth every 8 (eight) hours as needed for nausea or vomiting.    . pravastatin (PRAVACHOL) 20 MG tablet Take 20 mg by mouth at bedtime.     Marland Kitchen QVAR REDIHALER 40 MCG/ACT inhaler     . sertraline (ZOLOFT) 50 MG tablet Take 50 mg by mouth at bedtime.     . topiramate (TOPAMAX) 25  MG tablet     . triamcinolone ointment (KENALOG) 0.1 % Apply 1 application topically daily.    . valACYclovir (VALTREX) 1000 MG tablet Take 1 tablet (1,000 mg total) by mouth 3 (three) times daily. 21 tablet 0  . valACYclovir (VALTREX) 500 MG tablet     . VITAMIN D PO Take 1 tablet by mouth daily.     Current Facility-Administered Medications  Medication Dose Route Frequency Provider Last Rate Last Admin  . omalizumab Geoffry Paradise) injection 300 mg  300 mg Subcutaneous Q28 days Alfonse Spruce, MD   300 mg at 11/11/20 1202   Allergies: Allergies  Allergen Reactions  . Percocet [Oxycodone-Acetaminophen] Hives   I reviewed her past medical history, social history, family history, and environmental history and no significant changes have been reported from her previous visit.  Review of Systems  Constitutional: Negative for appetite change, chills, fever and unexpected weight change.  HENT: Negative for congestion, rhinorrhea and sneezing.   Eyes: Negative for itching.  Respiratory: Negative for cough,  chest tightness, shortness of breath and wheezing.   Cardiovascular: Negative for chest pain.  Gastrointestinal: Negative for abdominal pain.  Genitourinary: Negative for difficulty urinating.  Musculoskeletal:       Left foot in a boot  Skin: Negative for rash.  Allergic/Immunologic: Positive for environmental allergies. Negative for food allergies.  Neurological: Negative for headaches.   Objective: BP 126/70   Pulse 71   Temp 97.7 F (36.5 C)   Resp 18   Ht 5' (1.524 m)   Wt 218 lb (98.9 kg)   SpO2 98%   BMI 42.58 kg/m  Body mass index is 42.58 kg/m. Physical Exam Vitals and nursing note reviewed.  Constitutional:      Appearance: She is well-developed.  HENT:     Head: Normocephalic and atraumatic.     Right Ear: Tympanic membrane and external ear normal.     Left Ear: Tympanic membrane and external ear normal.     Nose: Nose normal.  Eyes:      Conjunctiva/sclera: Conjunctivae normal.  Cardiovascular:     Rate and Rhythm: Normal rate and regular rhythm.     Heart sounds: No friction rub. No gallop.   Pulmonary:     Effort: Pulmonary effort is normal.     Breath sounds: Normal breath sounds. No wheezing or rales.  Musculoskeletal:     Cervical back: Neck supple.  Skin:    General: Skin is warm.     Findings: No rash.  Neurological:     Mental Status: She is alert and oriented to person, place, and time.  Psychiatric:        Behavior: Behavior normal.    Previous notes and tests were reviewed. The plan was reviewed with the patient/family, and all questions/concerned were addressed.  It was my pleasure to see Shelley Mccoy today and participate in her care. Please feel free to contact me with any questions or concerns.  Sincerely,  Wyline Mood, DO Allergy & Immunology  [facility] office: [phone] [facility] office: [phone]

## 2020-11-27 ENCOUNTER — Encounter: Payer: Self-pay | Admitting: Allergy

## 2020-11-27 ENCOUNTER — Other Ambulatory Visit: Payer: Self-pay

## 2020-11-27 ENCOUNTER — Ambulatory Visit (INDEPENDENT_AMBULATORY_CARE_PROVIDER_SITE_OTHER): Payer: 59 | Admitting: Allergy

## 2020-11-27 VITALS — BP 126/70 | HR 71 | Temp 97.7°F | Resp 18 | Ht 60.0 in | Wt 218.0 lb

## 2020-11-27 DIAGNOSIS — J4540 Moderate persistent asthma, uncomplicated: Secondary | ICD-10-CM

## 2020-11-27 DIAGNOSIS — J3089 Other allergic rhinitis: Secondary | ICD-10-CM

## 2020-11-27 DIAGNOSIS — H1013 Acute atopic conjunctivitis, bilateral: Secondary | ICD-10-CM | POA: Diagnosis not present

## 2020-11-27 DIAGNOSIS — L508 Other urticaria: Secondary | ICD-10-CM

## 2020-11-27 DIAGNOSIS — H1010 Acute atopic conjunctivitis, unspecified eye: Secondary | ICD-10-CM

## 2020-11-27 DIAGNOSIS — J302 Other seasonal allergic rhinitis: Secondary | ICD-10-CM | POA: Diagnosis not present

## 2020-11-27 NOTE — Assessment & Plan Note (Signed)
Past history - Breaking out in rash/hives for the past 3 years.  Urticaria panel unremarkable.  Interim history - no hives since started on Xolair and has been able wean off oral antihistamines.  Continue Xolair injections 300mg  every 4 weeks.    If you have a flare you may restart zyrtec (cetirizine) 10mg  and Pepcid (famotidine) 20mg  twice a day.   Monitor symptoms.   Avoid the following potential triggers: alcohol, tight clothing, NSAIDs.

## 2020-11-27 NOTE — Assessment & Plan Note (Signed)
Past history - Rhinoconjunctivitis symptoms for the past 5 to 10 years mainly during the pollen seasons.  Using Allegra and Atrovent nasal spray with some benefit. 2020 skin testing showed: Positive to ragweed and dust mites, molds and cockroaches. Interim history - Stable.   Continue environmental control measures.  Continue Flonase 1 spray per nostril 1-2 times a day as needed for nasal congestion.   May use over the counter antihistamines such as Zyrtec (cetirizine), Claritin (loratadine), Allegra (fexofenadine), or Xyzal (levocetirizine) daily as needed.

## 2020-11-27 NOTE — Patient Instructions (Addendum)
Chronic urticaria  Continue Xolair injections 300mg  every 4 weeks.    If you have a flare you may restart zyrtec (cetirizine) 10mg  and pepcid (famotidine) 20mg  twice a day.   Monitor symptoms.   Avoid the following potential triggers: alcohol, tight clothing, NSAIDs.  Moderate persistent asthma without complication  Daily controller medication(s):continue Advair 2 puffs twice a day with spacer and rinse mouth afterwards.  Prior to physical activity:May use albuterol rescue inhaler 2 puffs 5 to 15 minutes prior to strenuous physical activities.  Rescue medications:May use albuterol rescue inhaler 2 puffs or nebulizer every 4 to 6 hours as needed for shortness of breath, chest tightness, coughing, and wheezing. Monitor frequency of use. Asthma control goals:  Full participation in all desired activities (may need albuterol before activity) Albuterol use two times or less a week on average (not counting use with activity) Cough interfering with sleep two times or less a month Oral steroids no more than once a year No hospitalizations  Seasonal and perennial allergic rhinoconjunctivitis 2020 skin testing showed: Positive to ragweed and dust mites, molds and cockroaches.  Continue environmental control measures.  Continue Flonase 1 spray per nostril 1-2 times a day as needed for nasal congestion.   May use over the counter antihistamines such as Zyrtec (cetirizine), Claritin (loratadine), Allegra (fexofenadine), or Xyzal (levocetirizine) daily as needed.  Follow up in 4 months or sooner if needed.

## 2020-11-27 NOTE — Assessment & Plan Note (Signed)
>>  ASSESSMENT AND PLAN FOR MODERATE PERSISTENT ASTHMA WITHOUT COMPLICATION WRITTEN ON 11/27/2020 12:55 PM BY Ellamae Sia, DO  Past history - Diagnosed with asthma 4 years ago.  2020 spirometry showed: possible restrictive disease and 21% improvement in FEV1 post bronchodilator treatment.   Interim history - stable but concerned about symptoms flaring in the spring.   Today's spirometry showed some restriction.   ACT score 25.   The Xolair is most likely also helping with her asthma.   Daily controller medication(s):continue Advair 2 puffs twice a day with spacer and rinse mouth afterwards.  Prior to physical activity:May use albuterol rescue inhaler 2 puffs 5 to 15 minutes prior to strenuous physical activities.  Rescue medications:May use albuterol rescue inhaler 2 puffs or nebulizer every 4 to 6 hours as needed for shortness of breath, chest tightness, coughing, and wheezing. Monitor frequency of use.  Repeat spirometry at next visit.

## 2020-11-27 NOTE — Assessment & Plan Note (Addendum)
Past history - Diagnosed with asthma 4 years ago.  2020 spirometry showed: possible restrictive disease and 21% improvement in FEV1 post bronchodilator treatment.   Interim history - stable but concerned about symptoms flaring in the spring.   Today's spirometry showed some restriction.   ACT score 25.   The Xolair is most likely also helping with her asthma.   Daily controller medication(s):continue Advair 2 puffs twice a day with spacer and rinse mouth afterwards.  Prior to physical activity:May use albuterol rescue inhaler 2 puffs 5 to 15 minutes prior to strenuous physical activities.  Rescue medications:May use albuterol rescue inhaler 2 puffs or nebulizer every 4 to 6 hours as needed for shortness of breath, chest tightness, coughing, and wheezing. Monitor frequency of use.  Repeat spirometry at next visit.

## 2020-12-04 ENCOUNTER — Other Ambulatory Visit (HOSPITAL_COMMUNITY): Payer: Self-pay

## 2020-12-04 MED FILL — PRAVASTATIN SODIUM 20 MG TA: 20 | 90 days supply | Qty: 90 | Fill #0

## 2020-12-04 MED FILL — SERTRALINE HCL 50 MG TABLET: 50 | 90 days supply | Qty: 90 | Fill #1

## 2020-12-04 MED FILL — FAMOTIDINE 20 MG TABS: 20 | 90 days supply | Qty: 180 | Fill #0

## 2020-12-04 MED FILL — GABAPENTIN 300 MG CAPSULE: 300 | 30 days supply | Qty: 90 | Fill #0

## 2020-12-04 MED FILL — TRESIBA FLEXTOUCH 100 UNITS: 100 | 90 days supply | Qty: 9 | Fill #0

## 2020-12-04 MED FILL — TRIAMCINOLONE 0.1% OINTMENT: 0.1 | 30 days supply | Qty: 30 | Fill #1

## 2020-12-04 MED FILL — LOSARTAN POTASSIUM 50 MG TA: 50 | 90 days supply | Qty: 90 | Fill #0

## 2020-12-04 MED FILL — HYDROCHLOROTHIAZIDE 12.5 MG: 12.5 | 90 days supply | Qty: 90 | Fill #0

## 2020-12-04 MED FILL — FREESTYLE LITE TEST STRIP: 75 days supply | Qty: 150 | Fill #1

## 2020-12-05 MED FILL — XOLAIR 150 MG SOLR: 150 | 28 days supply | Qty: 2 | Fill #3

## 2020-12-09 ENCOUNTER — Ambulatory Visit (INDEPENDENT_AMBULATORY_CARE_PROVIDER_SITE_OTHER): Payer: 59

## 2020-12-09 ENCOUNTER — Ambulatory Visit: Payer: Self-pay

## 2020-12-09 ENCOUNTER — Other Ambulatory Visit: Payer: Self-pay

## 2020-12-09 DIAGNOSIS — L501 Idiopathic urticaria: Secondary | ICD-10-CM | POA: Diagnosis not present

## 2020-12-09 DIAGNOSIS — L508 Other urticaria: Secondary | ICD-10-CM

## 2020-12-09 DIAGNOSIS — S82432A Displaced oblique fracture of shaft of left fibula, initial encounter for closed fracture: Secondary | ICD-10-CM | POA: Diagnosis not present

## 2020-12-23 DIAGNOSIS — S93422D Sprain of deltoid ligament of left ankle, subsequent encounter: Secondary | ICD-10-CM | POA: Diagnosis not present

## 2020-12-23 DIAGNOSIS — S8262XD Displaced fracture of lateral malleolus of left fibula, subsequent encounter for closed fracture with routine healing: Secondary | ICD-10-CM | POA: Diagnosis not present

## 2020-12-26 ENCOUNTER — Other Ambulatory Visit (HOSPITAL_COMMUNITY): Payer: Self-pay

## 2020-12-26 MED FILL — valACYclovir HCL 1 GM TABS: 1 | 90 days supply | Qty: 90 | Fill #1

## 2020-12-27 DIAGNOSIS — S93422D Sprain of deltoid ligament of left ankle, subsequent encounter: Secondary | ICD-10-CM | POA: Diagnosis not present

## 2020-12-27 DIAGNOSIS — S8262XD Displaced fracture of lateral malleolus of left fibula, subsequent encounter for closed fracture with routine healing: Secondary | ICD-10-CM | POA: Diagnosis not present

## 2020-12-30 DIAGNOSIS — S8262XD Displaced fracture of lateral malleolus of left fibula, subsequent encounter for closed fracture with routine healing: Secondary | ICD-10-CM | POA: Diagnosis not present

## 2020-12-30 DIAGNOSIS — S93422D Sprain of deltoid ligament of left ankle, subsequent encounter: Secondary | ICD-10-CM | POA: Diagnosis not present

## 2021-01-01 ENCOUNTER — Other Ambulatory Visit (HOSPITAL_COMMUNITY): Payer: Self-pay

## 2021-01-01 DIAGNOSIS — S8262XD Displaced fracture of lateral malleolus of left fibula, subsequent encounter for closed fracture with routine healing: Secondary | ICD-10-CM | POA: Diagnosis not present

## 2021-01-01 DIAGNOSIS — S93422D Sprain of deltoid ligament of left ankle, subsequent encounter: Secondary | ICD-10-CM | POA: Diagnosis not present

## 2021-01-01 MED FILL — Epinephrine Solution Auto-injector 0.3 MG/0.3ML (1:1000): INTRAMUSCULAR | 2 days supply | Qty: 2 | Fill #0 | Status: AC

## 2021-01-02 ENCOUNTER — Other Ambulatory Visit (HOSPITAL_COMMUNITY): Payer: Self-pay

## 2021-01-02 DIAGNOSIS — N952 Postmenopausal atrophic vaginitis: Secondary | ICD-10-CM | POA: Diagnosis not present

## 2021-01-02 DIAGNOSIS — E119 Type 2 diabetes mellitus without complications: Secondary | ICD-10-CM | POA: Diagnosis not present

## 2021-01-02 DIAGNOSIS — I10 Essential (primary) hypertension: Secondary | ICD-10-CM | POA: Diagnosis not present

## 2021-01-02 DIAGNOSIS — E669 Obesity, unspecified: Secondary | ICD-10-CM | POA: Diagnosis not present

## 2021-01-02 DIAGNOSIS — Z6841 Body Mass Index (BMI) 40.0 and over, adult: Secondary | ICD-10-CM | POA: Diagnosis not present

## 2021-01-02 DIAGNOSIS — J4520 Mild intermittent asthma, uncomplicated: Secondary | ICD-10-CM | POA: Diagnosis not present

## 2021-01-02 MED ORDER — PREMARIN 0.625 MG/GM VA CREA
TOPICAL_CREAM | VAGINAL | 1 refills | Status: DC
  Filled 2021-01-02: qty 30, 30d supply, fill #0
  Filled 2021-03-11: qty 30, 30d supply, fill #1

## 2021-01-08 ENCOUNTER — Other Ambulatory Visit (HOSPITAL_COMMUNITY): Payer: Self-pay

## 2021-01-13 ENCOUNTER — Ambulatory Visit (INDEPENDENT_AMBULATORY_CARE_PROVIDER_SITE_OTHER): Payer: 59 | Admitting: *Deleted

## 2021-01-13 ENCOUNTER — Other Ambulatory Visit: Payer: Self-pay

## 2021-01-13 DIAGNOSIS — S8262XD Displaced fracture of lateral malleolus of left fibula, subsequent encounter for closed fracture with routine healing: Secondary | ICD-10-CM | POA: Diagnosis not present

## 2021-01-13 DIAGNOSIS — S93422D Sprain of deltoid ligament of left ankle, subsequent encounter: Secondary | ICD-10-CM | POA: Diagnosis not present

## 2021-01-13 DIAGNOSIS — L501 Idiopathic urticaria: Secondary | ICD-10-CM | POA: Diagnosis not present

## 2021-01-13 DIAGNOSIS — L508 Other urticaria: Secondary | ICD-10-CM

## 2021-01-15 DIAGNOSIS — S93422D Sprain of deltoid ligament of left ankle, subsequent encounter: Secondary | ICD-10-CM | POA: Diagnosis not present

## 2021-01-15 DIAGNOSIS — S8262XD Displaced fracture of lateral malleolus of left fibula, subsequent encounter for closed fracture with routine healing: Secondary | ICD-10-CM | POA: Diagnosis not present

## 2021-01-20 DIAGNOSIS — S93422D Sprain of deltoid ligament of left ankle, subsequent encounter: Secondary | ICD-10-CM | POA: Diagnosis not present

## 2021-01-20 DIAGNOSIS — Z9889 Other specified postprocedural states: Secondary | ICD-10-CM | POA: Diagnosis not present

## 2021-01-20 DIAGNOSIS — S8262XD Displaced fracture of lateral malleolus of left fibula, subsequent encounter for closed fracture with routine healing: Secondary | ICD-10-CM | POA: Diagnosis not present

## 2021-01-31 ENCOUNTER — Other Ambulatory Visit (HOSPITAL_COMMUNITY): Payer: Self-pay

## 2021-01-31 DIAGNOSIS — S93422D Sprain of deltoid ligament of left ankle, subsequent encounter: Secondary | ICD-10-CM | POA: Diagnosis not present

## 2021-01-31 DIAGNOSIS — S8262XD Displaced fracture of lateral malleolus of left fibula, subsequent encounter for closed fracture with routine healing: Secondary | ICD-10-CM | POA: Diagnosis not present

## 2021-01-31 MED FILL — Omalizumab For Inj 150 MG: SUBCUTANEOUS | 28 days supply | Qty: 2 | Fill #0 | Status: AC

## 2021-02-05 DIAGNOSIS — S8262XD Displaced fracture of lateral malleolus of left fibula, subsequent encounter for closed fracture with routine healing: Secondary | ICD-10-CM | POA: Diagnosis not present

## 2021-02-05 DIAGNOSIS — S93422D Sprain of deltoid ligament of left ankle, subsequent encounter: Secondary | ICD-10-CM | POA: Diagnosis not present

## 2021-02-10 ENCOUNTER — Other Ambulatory Visit (HOSPITAL_COMMUNITY): Payer: Self-pay

## 2021-02-12 ENCOUNTER — Ambulatory Visit (INDEPENDENT_AMBULATORY_CARE_PROVIDER_SITE_OTHER): Payer: 59

## 2021-02-12 ENCOUNTER — Other Ambulatory Visit: Payer: Self-pay

## 2021-02-12 DIAGNOSIS — L501 Idiopathic urticaria: Secondary | ICD-10-CM | POA: Diagnosis not present

## 2021-02-12 DIAGNOSIS — L508 Other urticaria: Secondary | ICD-10-CM

## 2021-02-13 DIAGNOSIS — S8262XD Displaced fracture of lateral malleolus of left fibula, subsequent encounter for closed fracture with routine healing: Secondary | ICD-10-CM | POA: Diagnosis not present

## 2021-02-13 DIAGNOSIS — S93422D Sprain of deltoid ligament of left ankle, subsequent encounter: Secondary | ICD-10-CM | POA: Diagnosis not present

## 2021-02-18 DIAGNOSIS — S93422D Sprain of deltoid ligament of left ankle, subsequent encounter: Secondary | ICD-10-CM | POA: Diagnosis not present

## 2021-02-18 DIAGNOSIS — S8262XD Displaced fracture of lateral malleolus of left fibula, subsequent encounter for closed fracture with routine healing: Secondary | ICD-10-CM | POA: Diagnosis not present

## 2021-02-19 DIAGNOSIS — S8262XD Displaced fracture of lateral malleolus of left fibula, subsequent encounter for closed fracture with routine healing: Secondary | ICD-10-CM | POA: Diagnosis not present

## 2021-02-20 DIAGNOSIS — S8262XD Displaced fracture of lateral malleolus of left fibula, subsequent encounter for closed fracture with routine healing: Secondary | ICD-10-CM | POA: Diagnosis not present

## 2021-02-20 DIAGNOSIS — S93422D Sprain of deltoid ligament of left ankle, subsequent encounter: Secondary | ICD-10-CM | POA: Diagnosis not present

## 2021-02-21 ENCOUNTER — Other Ambulatory Visit (HOSPITAL_COMMUNITY): Payer: Self-pay

## 2021-02-21 MED FILL — Glucose Blood Test Strip: 90 days supply | Qty: 200 | Fill #0 | Status: AC

## 2021-02-27 DIAGNOSIS — S93422D Sprain of deltoid ligament of left ankle, subsequent encounter: Secondary | ICD-10-CM | POA: Diagnosis not present

## 2021-02-27 DIAGNOSIS — S8262XD Displaced fracture of lateral malleolus of left fibula, subsequent encounter for closed fracture with routine healing: Secondary | ICD-10-CM | POA: Diagnosis not present

## 2021-03-04 ENCOUNTER — Other Ambulatory Visit (HOSPITAL_COMMUNITY): Payer: Self-pay

## 2021-03-04 MED FILL — Omalizumab For Inj 150 MG: SUBCUTANEOUS | 28 days supply | Qty: 2 | Fill #1 | Status: AC

## 2021-03-06 ENCOUNTER — Other Ambulatory Visit (HOSPITAL_COMMUNITY): Payer: Self-pay

## 2021-03-06 DIAGNOSIS — S8262XD Displaced fracture of lateral malleolus of left fibula, subsequent encounter for closed fracture with routine healing: Secondary | ICD-10-CM | POA: Diagnosis not present

## 2021-03-06 DIAGNOSIS — S93422D Sprain of deltoid ligament of left ankle, subsequent encounter: Secondary | ICD-10-CM | POA: Diagnosis not present

## 2021-03-11 ENCOUNTER — Other Ambulatory Visit: Payer: Self-pay

## 2021-03-11 ENCOUNTER — Other Ambulatory Visit (HOSPITAL_COMMUNITY): Payer: Self-pay

## 2021-03-11 MED ORDER — SERTRALINE HCL 50 MG PO TABS
50.0000 mg | ORAL_TABLET | Freq: Every day | ORAL | 3 refills | Status: DC
  Filled 2021-03-11: qty 90, 90d supply, fill #0
  Filled 2021-04-02: qty 90, 90d supply, fill #1

## 2021-03-11 MED ORDER — CARESTART COVID-19 HOME TEST VI KIT
PACK | 0 refills | Status: DC
  Filled 2021-03-11: qty 4, 4d supply, fill #0

## 2021-03-11 MED FILL — Insulin Degludec Soln Pen-Injector 100 Unit/ML: SUBCUTANEOUS | 90 days supply | Qty: 9 | Fill #0 | Status: AC

## 2021-03-11 MED FILL — Famotidine Tab 20 MG: ORAL | 30 days supply | Qty: 60 | Fill #0 | Status: AC

## 2021-03-11 MED FILL — Glipizide Tab 10 MG: ORAL | 90 days supply | Qty: 90 | Fill #0 | Status: AC

## 2021-03-11 MED FILL — Valacyclovir HCl Tab 1 GM: ORAL | 90 days supply | Qty: 90 | Fill #0 | Status: AC

## 2021-03-11 MED FILL — Pravastatin Sodium Tab 20 MG: ORAL | 90 days supply | Qty: 90 | Fill #0 | Status: AC

## 2021-03-11 MED FILL — Albuterol Sulfate Inhal Aero 108 MCG/ACT (90MCG Base Equiv): RESPIRATORY_TRACT | 25 days supply | Qty: 18 | Fill #0 | Status: AC

## 2021-03-11 MED FILL — Glucose Blood Test Strip: 75 days supply | Qty: 150 | Fill #1 | Status: CN

## 2021-03-11 MED FILL — Fluticasone-Salmeterol Inhal Aerosol 115-21 MCG/ACT: RESPIRATORY_TRACT | 30 days supply | Qty: 12 | Fill #0 | Status: AC

## 2021-03-11 MED FILL — Hydrochlorothiazide Tab 12.5 MG: ORAL | 90 days supply | Qty: 90 | Fill #0 | Status: AC

## 2021-03-11 MED FILL — Lancets: 50 days supply | Qty: 100 | Fill #0 | Status: AC

## 2021-03-11 MED FILL — Losartan Potassium Tab 50 MG: ORAL | 90 days supply | Qty: 90 | Fill #0 | Status: AC

## 2021-03-11 MED FILL — Fluticasone Propionate Nasal Susp 50 MCG/ACT: NASAL | 50 days supply | Qty: 16 | Fill #0 | Status: AC

## 2021-03-11 MED FILL — Gabapentin Cap 300 MG: ORAL | 30 days supply | Qty: 90 | Fill #0 | Status: AC

## 2021-03-12 ENCOUNTER — Other Ambulatory Visit (HOSPITAL_COMMUNITY): Payer: Self-pay

## 2021-03-12 ENCOUNTER — Other Ambulatory Visit: Payer: Self-pay

## 2021-03-12 ENCOUNTER — Ambulatory Visit (INDEPENDENT_AMBULATORY_CARE_PROVIDER_SITE_OTHER): Payer: 59 | Admitting: Allergy

## 2021-03-12 DIAGNOSIS — L508 Other urticaria: Secondary | ICD-10-CM

## 2021-03-12 DIAGNOSIS — L501 Idiopathic urticaria: Secondary | ICD-10-CM

## 2021-03-13 ENCOUNTER — Other Ambulatory Visit (HOSPITAL_COMMUNITY): Payer: Self-pay

## 2021-03-19 ENCOUNTER — Other Ambulatory Visit: Payer: Self-pay | Admitting: Orthopaedic Surgery

## 2021-03-19 ENCOUNTER — Other Ambulatory Visit: Payer: Self-pay

## 2021-03-19 ENCOUNTER — Ambulatory Visit
Admission: RE | Admit: 2021-03-19 | Discharge: 2021-03-19 | Disposition: A | Discharge status: Home / Self Care | Payer: 59 | Source: Ambulatory Visit | Attending: Orthopaedic Surgery | Admitting: Orthopaedic Surgery

## 2021-03-19 DIAGNOSIS — T148XXA Other injury of unspecified body region, initial encounter: Secondary | ICD-10-CM

## 2021-03-19 DIAGNOSIS — S82832A Other fracture of upper and lower end of left fibula, initial encounter for closed fracture: Secondary | ICD-10-CM | POA: Diagnosis not present

## 2021-03-26 DIAGNOSIS — S8262XD Displaced fracture of lateral malleolus of left fibula, subsequent encounter for closed fracture with routine healing: Secondary | ICD-10-CM | POA: Diagnosis not present

## 2021-04-01 NOTE — Progress Notes (Signed)
Follow Up Note  RE: Shelley Mccoy MRN: [id number] DOB: 03-Feb-1971 Date of Office Visit: 04/02/2021  Referring provider: Corinna Lines, * Primary care provider: Corinna Lines, MD  Chief Complaint: Urticaria  History of Present Illness: I had the pleasure of seeing Shelley Mccoy for a follow up visit at the [facility] [place] [facility] [place] on 04/02/2021. She is a 50 y.o. female, who is being followed for chronic urticaria on Xolair $RemoveB'300mg'CEItpjpr$  every 4 weeks, asthma, allergic rhino conjunctivitis. Her previous allergy office visit was on 11/27/2020 with Dr. Maudie Mercury. Today is a regular follow up visit.  Chronic urticaria Currently on Xolair $RemoveB'300mg'naLsNuie$  every 4 weeks with good benefit. No hives/itching.  She is taking zyrtec $RemoveBefor'10mg'GsMbxmxjbnWI$  daily also for her allergies. She is not sure if she's taking famotidine still or not.    Moderate persistent asthma ACT score 16.  Patient noted increased dyspnea of exertion for the past month since she has to walk more from the parking lot to work and using the albuterol 1-2 times per day with good benefit.  She is still having issues with her left ankle/foot and it's still in a boot.   Seasonal and perennial allergic rhinoconjunctivitis Taking zyrtec $RemoveBefor'10mg'tzSAJPjdnwXU$  daily with good benefit.   Assessment and Plan: Shelley Mccoy is a 50 y.o. female with: Chronic urticaria Past history - Breaking out in rash/hives for the past 3 years.  Urticaria panel unremarkable.  Interim history - no hives since started on Xolair. Taking zyrtec $RemoveBefor'10mg'PtyZWWQvemVd$  daily for allergies and not sure about famotidine. Change Xolair injections $RemoveBeforeDEI'300mg'heLpsOLwnfHmvMVQ$  to every 5 weeks.   Stop famotidine. Continue with zyrtec $RemoveBef'10mg'iJMwCrCoZE$  daily - also for allergies.  Monitor symptoms.  Avoid the following potential triggers: alcohol, tight clothing, NSAIDs.   Moderate persistent asthma without complication Past history - Diagnosed with asthma 4 years ago.  2020 spirometry showed: possible restrictive disease and 21% improvement in  FEV1 post bronchodilator treatment.   Interim history - noticed DOE since parking far away from work. Denies cardiac issues but less active since hurt her foot.  Today's spirometry showed some restriction.  ACT score 16.  The Xolair is most likely also helping with her asthma.  Daily controller medication(s): INCREASE to Advair 257mcg 2 puffs twice a day with spacer and rinse mouth afterwards. STOP Qvar Prior to physical activity: May use albuterol rescue inhaler 2 puffs 5 to 15 minutes prior to strenuous physical activities. Rescue medications: May use albuterol rescue inhaler 2 puffs or nebulizer every 4 to 6 hours as needed for shortness of breath, chest tightness, coughing, and wheezing. Monitor frequency of use.  Get spirometry at next visit. Discussed that some of her dyspnea on exertion maybe due to physical conditioning as well.   Seasonal and perennial allergic rhinoconjunctivitis Past history - Rhinoconjunctivitis symptoms for the past 5 to 10 years mainly during the pollen seasons.  Using Allegra and Atrovent nasal spray with some benefit. 2020 skin testing showed: Positive to ragweed and dust mites, molds and cockroaches. Interim history - Stable.  Continue environmental control measures. Continue Flonase 1 spray per nostril 1-2 times a day as needed for nasal congestion.  May use over the counter antihistamines such as Zyrtec (cetirizine), Claritin (loratadine), Allegra (fexofenadine), or Xyzal (levocetirizine) daily as needed.  Return in about 3 months (around 07/03/2021).  Meds ordered this encounter  Medications   fluticasone-salmeterol (ADVAIR HFA) 230-21 MCG/ACT inhaler    Sig: Inhale 2 puffs into the lungs 2 (two) times daily. with spacer and rinse  mouth afterwards.    Dispense:  12 g    Refill:  5    Lab Orders  No laboratory test(s) ordered today    Diagnostics: Spirometry:  Tracings reviewed. Her effort: Good reproducible efforts. FVC: 1.55L FEV1: 1.19L, 58%  predicted FEV1/FVC ratio: 77% Interpretation: Spirometry consistent with restrictive disease.  Please see scanned spirometry results for details.  Medication List:  Current Outpatient Medications  Medication Sig Dispense Refill   albuterol (VENTOLIN HFA) 108 (90 Base) MCG/ACT inhaler Inhale 2 puffs into the lungs every 6 (six) hours as needed for wheezing or shortness of breath. 18 g 1   Ascorbic Acid (VITAMIN C PO) Take 1 tablet by mouth daily.     Blood Glucose Monitoring Suppl (FREESTYLE LITE) w/Device KIT USE AS DIRECTED 1 kit 0   cetirizine (ZYRTEC ALLERGY) 10 MG tablet Take 1 tablet (10 mg total) by mouth 2 (two) times daily. 60 tablet 5   conjugated estrogens (PREMARIN) vaginal cream Place 1 Applicatorful vaginally daily. 42.5 g 12   cyclobenzaprine (FLEXERIL) 10 MG tablet      EPINEPHrine 0.3 mg/0.3 mL IJ SOAJ injection      Ferrous Sulfate (IRON PO) Take 1 tablet by mouth daily.     fluticasone (FLONASE) 50 MCG/ACT nasal spray 1 spray per nostril 1-2 times daily as needed for nasal congestion. 1 g 5   fluticasone-salmeterol (ADVAIR HFA) 230-21 MCG/ACT inhaler Inhale 2 puffs into the lungs 2 (two) times daily. with spacer and rinse mouth afterwards. 12 g 5   gabapentin (NEURONTIN) 300 MG capsule Take 300 mg by mouth daily.      glipiZIDE (GLUCOTROL) 10 MG tablet Take 10 mg by mouth daily.     glucose blood test strip USE AS DIRECTED TWO TIMES DAILY 200 strip 3   hydrochlorothiazide (HYDRODIURIL) 12.5 MG tablet Take 12.5 mg by mouth daily.     ibuprofen (ADVIL) 800 MG tablet Take 800 mg by mouth 3 (three) times daily.     insulin degludec (TRESIBA) 100 UNIT/ML FlexTouch Pen Inject 10 Units into the skin daily.     ipratropium (ATROVENT) 0.03 % nasal spray Place 2 sprays into both nostrils 2 (two) times daily.     Lancets (FREESTYLE) lancets USE AS DIRECTED TWO TIMES DAILY 200 each 3   losartan (COZAAR) 50 MG tablet Take 50 mg by mouth daily.     omalizumab (XOLAIR) 150 MG injection  INJECT 300 MG SUBCUTANEOUSLY EVERY 4 WEEKS. 2 each 10   Omega-3 Fatty Acids (FISH OIL) 1000 MG CAPS Take by mouth.     ondansetron (ZOFRAN) 4 MG tablet Take 4 mg by mouth every 8 (eight) hours as needed for nausea or vomiting.     pravastatin (PRAVACHOL) 20 MG tablet Take 20 mg by mouth at bedtime.      QVAR REDIHALER 40 MCG/ACT inhaler      sertraline (ZOLOFT) 50 MG tablet Take 50 mg by mouth at bedtime.      topiramate (TOPAMAX) 25 MG tablet      triamcinolone ointment (KENALOG) 0.1 % Apply 1 application topically daily.     valACYclovir (VALTREX) 1000 MG tablet Take 1 tablet (1,000 mg total) by mouth 3 (three) times daily. 21 tablet 0   VITAMIN D PO Take 1 tablet by mouth daily.     Current Facility-Administered Medications  Medication Dose Route Frequency Provider Last Rate Last Admin   omalizumab Arvid Right) injection 300 mg  300 mg Subcutaneous Q28 days Valentina Shaggy, MD  300 mg at 03/12/21 1423   Allergies: Allergies  Allergen Reactions   Percocet [Oxycodone-Acetaminophen] Hives   I reviewed her past medical history, social history, family history, and environmental history and no significant changes have been reported from her previous visit.  Review of Systems  Constitutional:  Negative for appetite change, chills, fever and unexpected weight change.  HENT:  Negative for congestion, rhinorrhea and sneezing.   Eyes:  Negative for itching.  Respiratory:  Negative for cough, chest tightness, shortness of breath and wheezing.   Cardiovascular:  Negative for chest pain.  Gastrointestinal:  Negative for abdominal pain.  Genitourinary:  Negative for difficulty urinating.  Musculoskeletal:        Left foot in a boot  Skin:  Negative for rash.  Allergic/Immunologic: Positive for environmental allergies. Negative for food allergies.  Neurological:  Negative for headaches.   Objective: BP 126/80   Pulse 66   Temp 98.2 F (36.8 C) (Temporal)   Resp 16   SpO2 97%  There  is no height or weight on file to calculate BMI. Physical Exam Vitals and nursing note reviewed.  Constitutional:      Appearance: She is well-developed.  HENT:     Head: Normocephalic and atraumatic.     Right Ear: Tympanic membrane and external ear normal.     Left Ear: Tympanic membrane and external ear normal.     Nose: Nose normal.  Eyes:     Conjunctiva/sclera: Conjunctivae normal.  Cardiovascular:     Rate and Rhythm: Normal rate and regular rhythm.     Heart sounds:    No friction rub. No gallop.  Pulmonary:     Effort: Pulmonary effort is normal.     Breath sounds: Normal breath sounds. No wheezing or rales.  Musculoskeletal:     Cervical back: Neck supple.  Skin:    General: Skin is warm.     Findings: No rash.  Neurological:     Mental Status: She is alert and oriented to person, place, and time.  Psychiatric:        Behavior: Behavior normal.   Previous notes and tests were reviewed. The plan was reviewed with the patient/family, and all questions/concerned were addressed.  It was my pleasure to see Arrie today and participate in her care. Please feel free to contact me with any questions or concerns.  Sincerely,  Rexene Alberts, DO Allergy & Immunology  [facility] office: [place] office: [phone]

## 2021-04-02 ENCOUNTER — Other Ambulatory Visit: Payer: Self-pay

## 2021-04-02 ENCOUNTER — Other Ambulatory Visit (HOSPITAL_COMMUNITY): Payer: Self-pay

## 2021-04-02 ENCOUNTER — Encounter: Payer: Self-pay | Admitting: Allergy

## 2021-04-02 ENCOUNTER — Ambulatory Visit (INDEPENDENT_AMBULATORY_CARE_PROVIDER_SITE_OTHER): Payer: 59 | Admitting: Allergy

## 2021-04-02 VITALS — BP 126/80 | HR 66 | Temp 98.2°F | Resp 16

## 2021-04-02 DIAGNOSIS — H1010 Acute atopic conjunctivitis, unspecified eye: Secondary | ICD-10-CM

## 2021-04-02 DIAGNOSIS — J302 Other seasonal allergic rhinitis: Secondary | ICD-10-CM

## 2021-04-02 DIAGNOSIS — L508 Other urticaria: Secondary | ICD-10-CM

## 2021-04-02 DIAGNOSIS — J4540 Moderate persistent asthma, uncomplicated: Secondary | ICD-10-CM | POA: Diagnosis not present

## 2021-04-02 DIAGNOSIS — J3089 Other allergic rhinitis: Secondary | ICD-10-CM | POA: Diagnosis not present

## 2021-04-02 DIAGNOSIS — H1013 Acute atopic conjunctivitis, bilateral: Secondary | ICD-10-CM | POA: Diagnosis not present

## 2021-04-02 MED ORDER — ADVAIR HFA 230-21 MCG/ACT IN AERO
2.0000 | INHALATION_SPRAY | Freq: Two times a day (BID) | RESPIRATORY_TRACT | 5 refills | Status: DC
  Filled 2021-04-02: qty 12, 30d supply, fill #0
  Filled 2021-05-31 – 2021-06-17 (×2): qty 12, 30d supply, fill #1
  Filled 2021-07-02 – 2021-07-16 (×2): qty 12, 30d supply, fill #2
  Filled 2021-08-06 – 2021-08-26 (×2): qty 12, 30d supply, fill #3

## 2021-04-02 MED FILL — Pravastatin Sodium Tab 20 MG: ORAL | 90 days supply | Qty: 90 | Fill #1 | Status: CN

## 2021-04-02 MED FILL — Omalizumab For Inj 150 MG: SUBCUTANEOUS | 28 days supply | Qty: 2 | Fill #2 | Status: AC

## 2021-04-02 NOTE — Assessment & Plan Note (Signed)
Past history - Diagnosed with asthma 4 years ago.  2020 spirometry showed: possible restrictive disease and 21% improvement in FEV1 post bronchodilator treatment.   Interim history - noticed DOE since parking far away from work. Denies cardiac issues but less active since hurt her foot.   Today's spirometry showed some restriction.   ACT score 16.   The Xolair is most likely also helping with her asthma.   Daily controller medication(s): INCREASE to Advair 2 puffs twice a day with spacer and rinse mouth afterwards.  STOP Qvar  Prior to physical activity: May use albuterol rescue inhaler 2 puffs 5 to 15 minutes prior to strenuous physical activities.  Rescue medications: May use albuterol rescue inhaler 2 puffs or nebulizer every 4 to 6 hours as needed for shortness of breath, chest tightness, coughing, and wheezing. Monitor frequency of use.   Get spirometry at next visit.  Discussed that some of her dyspnea on exertion maybe due to physical conditioning as well.

## 2021-04-02 NOTE — Patient Instructions (Addendum)
Chronic urticaria Change Xolair injections 300mg  to every 5 weeks.   Stop famotdine. Continue with zyrtec 10mg  daily - also for allergies.  Monitor symptoms.  Avoid the following potential triggers: alcohol, tight clothing, NSAIDs.    Moderate persistent asthma  Daily controller medication(s): INCREASE to Advair 2 puffs twice a day with spacer and rinse mouth afterwards. STOP Qvar Prior to physical activity: May use albuterol rescue inhaler 2 puffs 5 to 15 minutes prior to strenuous physical activities. Rescue medications: May use albuterol rescue inhaler 2 puffs or nebulizer every 4 to 6 hours as needed for shortness of breath, chest tightness, coughing, and wheezing. Monitor frequency of use.  Asthma control goals:  Full participation in all desired activities (may need albuterol before activity) Albuterol use two times or less a week on average (not counting use with activity) Cough interfering with sleep two times or less a month Oral steroids no more than once a year No hospitalizations   Seasonal and perennial allergic rhinoconjunctivitis 2020 skin testing showed: Positive to ragweed and dust mites, molds and cockroaches. Continue environmental control measures. Continue Flonase 1 spray per nostril 1-2 times a day as needed for nasal congestion.  May use over the counter antihistamines such as Zyrtec (cetirizine), Claritin (loratadine), Allegra (fexofenadine), or Xyzal (levocetirizine) daily as needed.  Follow up in 3 months or sooner if needed.

## 2021-04-02 NOTE — Assessment & Plan Note (Signed)
Past history - Rhinoconjunctivitis symptoms for the past 5 to 10 years mainly during the pollen seasons.  Using Allegra and Atrovent nasal spray with some benefit. 2020 skin testing showed: Positive to ragweed and dust mites, molds and cockroaches. Interim history - Stable.   Continue environmental control measures.  Continue Flonase 1 spray per nostril 1-2 times a day as needed for nasal congestion.   May use over the counter antihistamines such as Zyrtec (cetirizine), Claritin (loratadine), Allegra (fexofenadine), or Xyzal (levocetirizine) daily as needed.

## 2021-04-02 NOTE — Assessment & Plan Note (Signed)
Past history - Breaking out in rash/hives for the past 3 years.  Urticaria panel unremarkable.  Interim history - no hives since started on Xolair. Taking zyrtec 10mg  daily for allergies and not sure about famotidine.  Change Xolair injections 300mg  to every 5 weeks.    Stop famotidine.  Continue with zyrtec 10mg  daily - also for allergies.   Monitor symptoms.   Avoid the following potential triggers: alcohol, tight clothing, NSAIDs.

## 2021-04-02 NOTE — Assessment & Plan Note (Signed)
>>  ASSESSMENT AND PLAN FOR MODERATE PERSISTENT ASTHMA WITHOUT COMPLICATION WRITTEN ON 04/02/2021  1:12 PM BY Ellamae Sia, DO  Past history - Diagnosed with asthma 4 years ago.  2020 spirometry showed: possible restrictive disease and 21% improvement in FEV1 post bronchodilator treatment.   Interim history - noticed DOE since parking far away from work. Denies cardiac issues but less active since hurt her foot.   Today's spirometry showed some restriction.   ACT score 16.   The Xolair is most likely also helping with her asthma.   Daily controller medication(s): INCREASE to Advair 2 puffs twice a day with spacer and rinse mouth afterwards.  STOP Qvar  Prior to physical activity: May use albuterol rescue inhaler 2 puffs 5 to 15 minutes prior to strenuous physical activities.  Rescue medications: May use albuterol rescue inhaler 2 puffs or nebulizer every 4 to 6 hours as needed for shortness of breath, chest tightness, coughing, and wheezing. Monitor frequency of use.   Get spirometry at next visit.  Discussed that some of her dyspnea on exertion maybe due to physical conditioning as well.

## 2021-04-03 ENCOUNTER — Other Ambulatory Visit (HOSPITAL_COMMUNITY): Payer: Self-pay

## 2021-04-03 DIAGNOSIS — E119 Type 2 diabetes mellitus without complications: Secondary | ICD-10-CM | POA: Diagnosis not present

## 2021-04-03 DIAGNOSIS — J309 Allergic rhinitis, unspecified: Secondary | ICD-10-CM | POA: Diagnosis not present

## 2021-04-03 DIAGNOSIS — F329 Major depressive disorder, single episode, unspecified: Secondary | ICD-10-CM | POA: Diagnosis not present

## 2021-04-03 DIAGNOSIS — D649 Anemia, unspecified: Secondary | ICD-10-CM | POA: Diagnosis not present

## 2021-04-03 DIAGNOSIS — G43909 Migraine, unspecified, not intractable, without status migrainosus: Secondary | ICD-10-CM | POA: Diagnosis not present

## 2021-04-03 DIAGNOSIS — J4520 Mild intermittent asthma, uncomplicated: Secondary | ICD-10-CM | POA: Diagnosis not present

## 2021-04-03 DIAGNOSIS — E785 Hyperlipidemia, unspecified: Secondary | ICD-10-CM | POA: Diagnosis not present

## 2021-04-03 DIAGNOSIS — I10 Essential (primary) hypertension: Secondary | ICD-10-CM | POA: Diagnosis not present

## 2021-04-03 DIAGNOSIS — B009 Herpesviral infection, unspecified: Secondary | ICD-10-CM | POA: Diagnosis not present

## 2021-04-03 DIAGNOSIS — E559 Vitamin D deficiency, unspecified: Secondary | ICD-10-CM | POA: Diagnosis not present

## 2021-04-03 MED ORDER — HYDROCHLOROTHIAZIDE 12.5 MG PO TABS
12.5000 mg | ORAL_TABLET | Freq: Every day | ORAL | 3 refills | Status: DC
  Filled 2021-04-03 – 2021-06-17 (×3): qty 90, 90d supply, fill #0
  Filled 2021-07-02 – 2021-08-26 (×4): qty 90, 90d supply, fill #1
  Filled 2021-12-09: qty 90, 90d supply, fill #2

## 2021-04-03 MED ORDER — VALACYCLOVIR HCL 1 G PO TABS
1000.0000 mg | ORAL_TABLET | Freq: Every day | ORAL | 3 refills | Status: DC
  Filled 2021-04-03 – 2021-06-17 (×3): qty 90, 90d supply, fill #0
  Filled 2021-07-02 – 2021-10-06 (×5): qty 90, 90d supply, fill #1
  Filled 2021-12-09 – 2022-03-18 (×2): qty 90, 90d supply, fill #2

## 2021-04-03 MED ORDER — ROSUVASTATIN CALCIUM 20 MG PO TABS
20.0000 mg | ORAL_TABLET | Freq: Every day | ORAL | 3 refills | Status: DC
Start: 2021-04-03 — End: 2022-02-11
  Filled 2021-04-03: qty 90, 90d supply, fill #0
  Filled 2021-05-31 – 2021-06-17 (×2): qty 90, 90d supply, fill #1
  Filled 2021-07-02 – 2021-12-09 (×5): qty 90, 90d supply, fill #2

## 2021-04-03 MED ORDER — GABAPENTIN 300 MG PO CAPS
300.0000 mg | ORAL_CAPSULE | Freq: Three times a day (TID) | ORAL | 3 refills | Status: DC
  Filled 2021-04-03: qty 270, 90d supply, fill #0
  Filled 2021-05-31 – 2021-07-16 (×3): qty 270, 90d supply, fill #1
  Filled 2021-08-06: qty 270, 90d supply, fill #2

## 2021-04-03 MED ORDER — LOSARTAN POTASSIUM 50 MG PO TABS
50.0000 mg | ORAL_TABLET | Freq: Every day | ORAL | 3 refills | Status: DC
  Filled 2021-04-03 – 2021-06-17 (×3): qty 90, 90d supply, fill #0
  Filled 2021-07-02 – 2021-08-26 (×4): qty 90, 90d supply, fill #1
  Filled 2021-12-09: qty 90, 90d supply, fill #2

## 2021-04-03 MED ORDER — FAMOTIDINE 20 MG PO TABS
20.0000 mg | ORAL_TABLET | Freq: Two times a day (BID) | ORAL | 3 refills | Status: DC
  Filled 2021-04-03: qty 180, 90d supply, fill #0
  Filled 2021-05-31 – 2021-06-17 (×2): qty 180, 90d supply, fill #1
  Filled 2021-07-02 – 2021-08-26 (×4): qty 180, 90d supply, fill #2

## 2021-04-03 MED ORDER — GLIPIZIDE 10 MG PO TABS
10.0000 mg | ORAL_TABLET | Freq: Every day | ORAL | 3 refills | Status: DC
  Filled 2021-04-03 – 2021-06-17 (×3): qty 90, 90d supply, fill #0
  Filled 2021-07-02 – 2021-08-06 (×3): qty 90, 90d supply, fill #1

## 2021-04-04 ENCOUNTER — Other Ambulatory Visit (HOSPITAL_COMMUNITY): Payer: Self-pay

## 2021-04-07 ENCOUNTER — Other Ambulatory Visit (HOSPITAL_COMMUNITY): Payer: Self-pay

## 2021-04-08 ENCOUNTER — Other Ambulatory Visit (HOSPITAL_COMMUNITY): Payer: Self-pay

## 2021-04-08 MED ORDER — VITAMIN D (ERGOCALCIFEROL) 1.25 MG (50000 UNIT) PO CAPS
50000.0000 [IU] | ORAL_CAPSULE | ORAL | 3 refills | Status: DC
  Filled 2021-04-08: qty 12, 84d supply, fill #0
  Filled 2021-05-31 – 2021-06-17 (×2): qty 12, 84d supply, fill #1
  Filled 2021-07-02 – 2021-10-06 (×5): qty 12, 84d supply, fill #2
  Filled 2021-12-09: qty 12, 84d supply, fill #3

## 2021-04-09 ENCOUNTER — Other Ambulatory Visit (HOSPITAL_COMMUNITY): Payer: Self-pay

## 2021-04-10 ENCOUNTER — Other Ambulatory Visit (HOSPITAL_COMMUNITY): Payer: Self-pay

## 2021-04-10 DIAGNOSIS — D649 Anemia, unspecified: Secondary | ICD-10-CM | POA: Diagnosis not present

## 2021-04-11 ENCOUNTER — Ambulatory Visit: Payer: Self-pay

## 2021-04-16 ENCOUNTER — Telehealth: Payer: Self-pay

## 2021-04-16 ENCOUNTER — Ambulatory Visit (INDEPENDENT_AMBULATORY_CARE_PROVIDER_SITE_OTHER): Payer: 59

## 2021-04-16 ENCOUNTER — Ambulatory Visit: Payer: Self-pay

## 2021-04-16 ENCOUNTER — Other Ambulatory Visit: Payer: Self-pay

## 2021-04-16 DIAGNOSIS — L501 Idiopathic urticaria: Secondary | ICD-10-CM

## 2021-04-16 DIAGNOSIS — L508 Other urticaria: Secondary | ICD-10-CM

## 2021-04-16 NOTE — Telephone Encounter (Signed)
Patient called today stating she couldn't wait 5 weeks to get her Xolair injection. She was scheduled to come in 04/22/21 but she said her skin was really bad with her scratching and her lips look irregular. I worked her in to today's schedule and she wants to stay on every 4 weeks not 5.

## 2021-04-18 ENCOUNTER — Ambulatory Visit: Payer: Self-pay

## 2021-04-29 ENCOUNTER — Other Ambulatory Visit (HOSPITAL_COMMUNITY): Payer: Self-pay

## 2021-05-13 ENCOUNTER — Other Ambulatory Visit: Payer: Self-pay

## 2021-05-13 DIAGNOSIS — Z1231 Encounter for screening mammogram for malignant neoplasm of breast: Secondary | ICD-10-CM

## 2021-05-14 ENCOUNTER — Other Ambulatory Visit (HOSPITAL_COMMUNITY): Payer: Self-pay

## 2021-05-14 ENCOUNTER — Ambulatory Visit: Payer: 59

## 2021-05-14 MED FILL — Omalizumab For Inj 150 MG: SUBCUTANEOUS | 28 days supply | Qty: 2 | Fill #3 | Status: AC

## 2021-05-16 ENCOUNTER — Other Ambulatory Visit: Payer: Self-pay

## 2021-05-16 ENCOUNTER — Ambulatory Visit: Payer: 59

## 2021-05-16 ENCOUNTER — Ambulatory Visit (INDEPENDENT_AMBULATORY_CARE_PROVIDER_SITE_OTHER): Payer: 59

## 2021-05-16 DIAGNOSIS — L501 Idiopathic urticaria: Secondary | ICD-10-CM

## 2021-05-16 DIAGNOSIS — L508 Other urticaria: Secondary | ICD-10-CM

## 2021-05-17 DIAGNOSIS — Z20822 Contact with and (suspected) exposure to covid-19: Secondary | ICD-10-CM | POA: Diagnosis not present

## 2021-05-17 DIAGNOSIS — U071 COVID-19: Secondary | ICD-10-CM | POA: Diagnosis not present

## 2021-05-17 DIAGNOSIS — R6883 Chills (without fever): Secondary | ICD-10-CM | POA: Diagnosis not present

## 2021-06-03 ENCOUNTER — Other Ambulatory Visit (HOSPITAL_COMMUNITY): Payer: Self-pay

## 2021-06-04 ENCOUNTER — Ambulatory Visit: Payer: 59

## 2021-06-05 ENCOUNTER — Other Ambulatory Visit (HOSPITAL_COMMUNITY): Payer: Self-pay

## 2021-06-09 ENCOUNTER — Other Ambulatory Visit (HOSPITAL_COMMUNITY): Payer: Self-pay

## 2021-06-09 MED FILL — Omalizumab For Inj 150 MG: SUBCUTANEOUS | 28 days supply | Qty: 2 | Fill #4 | Status: AC

## 2021-06-11 ENCOUNTER — Other Ambulatory Visit (HOSPITAL_COMMUNITY): Payer: Self-pay

## 2021-06-13 ENCOUNTER — Ambulatory Visit: Payer: 59

## 2021-06-16 ENCOUNTER — Ambulatory Visit (INDEPENDENT_AMBULATORY_CARE_PROVIDER_SITE_OTHER): Payer: 59 | Admitting: *Deleted

## 2021-06-16 ENCOUNTER — Other Ambulatory Visit: Payer: Self-pay

## 2021-06-16 DIAGNOSIS — L501 Idiopathic urticaria: Secondary | ICD-10-CM | POA: Diagnosis not present

## 2021-06-16 DIAGNOSIS — L508 Other urticaria: Secondary | ICD-10-CM

## 2021-06-17 ENCOUNTER — Other Ambulatory Visit: Payer: Self-pay

## 2021-06-17 ENCOUNTER — Other Ambulatory Visit (HOSPITAL_COMMUNITY): Payer: Self-pay

## 2021-06-17 MED FILL — Glucose Blood Test Strip: 90 days supply | Qty: 200 | Fill #1 | Status: AC

## 2021-06-17 MED FILL — Lancets: 50 days supply | Qty: 100 | Fill #1 | Status: AC

## 2021-06-25 ENCOUNTER — Other Ambulatory Visit (HOSPITAL_COMMUNITY): Payer: Self-pay

## 2021-06-27 ENCOUNTER — Other Ambulatory Visit (HOSPITAL_COMMUNITY): Payer: Self-pay

## 2021-07-02 ENCOUNTER — Other Ambulatory Visit (HOSPITAL_COMMUNITY): Payer: Self-pay

## 2021-07-02 MED FILL — Omalizumab For Inj 150 MG: SUBCUTANEOUS | 28 days supply | Qty: 2 | Fill #5 | Status: CN

## 2021-07-02 MED FILL — Lancets: 50 days supply | Qty: 100 | Fill #2 | Status: CN

## 2021-07-02 MED FILL — Omalizumab For Inj 150 MG: SUBCUTANEOUS | 28 days supply | Qty: 2 | Fill #5 | Status: AC

## 2021-07-02 MED FILL — Glucose Blood Test Strip: 90 days supply | Qty: 200 | Fill #2 | Status: CN

## 2021-07-03 ENCOUNTER — Other Ambulatory Visit (HOSPITAL_COMMUNITY): Payer: Self-pay

## 2021-07-03 MED FILL — Glucose Blood Test Strip: 90 days supply | Qty: 200 | Fill #2 | Status: CN

## 2021-07-03 MED FILL — Lancets: 50 days supply | Qty: 100 | Fill #2 | Status: CN

## 2021-07-07 ENCOUNTER — Ambulatory Visit: Payer: 59 | Admitting: Allergy

## 2021-07-07 DIAGNOSIS — J309 Allergic rhinitis, unspecified: Secondary | ICD-10-CM

## 2021-07-07 NOTE — Progress Notes (Deleted)
Follow Up Note  RE: Shelley Mccoy MRN: [id number] DOB: Nov 01, 1970 Date of Office Visit: 07/07/2021  Referring provider: Corinna Mccoy, * Primary care provider: Corinna Lines, MD  Chief Complaint: No chief complaint on file.  History of Present Illness: I had the pleasure of seeing Shelley Mccoy for a follow up visit at the [facility] [place] [facility] [place] on 07/07/2021. She is a 50 y.o. female, who is being followed for chronic urticaria on Xolair, asthma, allergic rhinoconjunctivitis. Her previous allergy office visit was on 04/02/2021 with Dr. Maudie Mccoy. Today is a regular follow up visit.  Chronic urticaria Past history - Breaking out in rash/hives for the past 3 years.  Urticaria panel unremarkable.  Interim history - no hives since started on Xolair. Taking zyrtec $RemoveBefor'10mg'iBXcqZdKIQiI$  daily for allergies and not sure about famotidine. Change Xolair injections $RemoveBeforeDEI'300mg'ZKSaRgNhhwiSPtPF$  to every 5 weeks.   Stop famotidine. Continue with zyrtec $RemoveBef'10mg'HeWTXjABby$  daily - also for allergies.  Monitor symptoms.  Avoid the following potential triggers: alcohol, tight clothing, NSAIDs.    Moderate persistent asthma without complication Past history - Diagnosed with asthma 4 years ago.  2020 spirometry showed: possible restrictive disease and 21% improvement in FEV1 post bronchodilator treatment.   Interim history - noticed DOE since parking far away from work. Denies cardiac issues but less active since hurt her foot.  Today's spirometry showed some restriction.  ACT score 16.  The Xolair is most likely also helping with her asthma.  Daily controller medication(s): INCREASE to Advair 260mcg 2 puffs twice a day with spacer and rinse mouth afterwards. STOP Qvar Prior to physical activity: May use albuterol rescue inhaler 2 puffs 5 to 15 minutes prior to strenuous physical activities. Rescue medications: May use albuterol rescue inhaler 2 puffs or nebulizer every 4 to 6 hours as needed for shortness of breath, chest tightness,  coughing, and wheezing. Monitor frequency of use.  Get spirometry at next visit. Discussed that some of her dyspnea on exertion maybe due to physical conditioning as well.    Seasonal and perennial allergic rhinoconjunctivitis Past history - Rhinoconjunctivitis symptoms for the past 5 to 10 years mainly during the pollen seasons.  Using Allegra and Atrovent nasal spray with some benefit. 2020 skin testing showed: Positive to ragweed and dust mites, molds and cockroaches. Interim history - Stable.  Continue environmental control measures. Continue Flonase 1 spray per nostril 1-2 times a day as needed for nasal congestion.  May use over the counter antihistamines such as Zyrtec (cetirizine), Claritin (loratadine), Allegra (fexofenadine), or Xyzal (levocetirizine) daily as needed.   Return in about 3 months (around 07/03/2021).  Assessment and Plan: Shelley Mccoy is a 50 y.o. female with: No problem-specific Assessment & Plan notes found for this encounter.  No follow-ups on file.  No orders of the defined types were placed in this encounter.  Lab Orders  No laboratory test(s) ordered today    Diagnostics: Spirometry:  Tracings reviewed. Her effort: {Blank single:19197::"Good reproducible efforts.","It was hard to get consistent efforts and there is a question as to whether this reflects a maximal maneuver.","Poor effort, data can not be interpreted."} FVC: ***L FEV1: ***L, ***% predicted FEV1/FVC ratio: ***% Interpretation: {Blank single:19197::"Spirometry consistent with mild obstructive disease","Spirometry consistent with moderate obstructive disease","Spirometry consistent with severe obstructive disease","Spirometry consistent with possible restrictive disease","Spirometry consistent with mixed obstructive and restrictive disease","Spirometry uninterpretable due to technique","Spirometry consistent with normal pattern","No overt abnormalities noted given today's efforts"}.  Please see  scanned spirometry results for details.  Skin Testing: {Blank  GSUPJS:31594::"VOPFYT foods","Environmental allergy panel","Environmental allergy panel and select foods","Food allergy panel","None","Deferred due to recent antihistamines use"}. *** Results discussed with patient/family.   Medication List:  Current Outpatient Medications  Medication Sig Dispense Refill  . albuterol (VENTOLIN HFA) 108 (90 Base) MCG/ACT inhaler Inhale 2 puffs into the lungs every 6 (six) hours as needed for wheezing or shortness of breath. 18 g 1  . Ascorbic Acid (VITAMIN C PO) Take 1 tablet by mouth daily.    . Blood Glucose Monitoring Suppl (FREESTYLE LITE) w/Device KIT USE AS DIRECTED 1 kit 0  . cetirizine (ZYRTEC ALLERGY) 10 MG tablet Take 1 tablet (10 mg total) by mouth 2 (two) times daily. 60 tablet 5  . conjugated estrogens (PREMARIN) vaginal cream Place 1 Applicatorful vaginally daily. 42.5 g 12  . cyclobenzaprine (FLEXERIL) 10 MG tablet     . EPINEPHrine 0.3 mg/0.3 mL IJ SOAJ injection     . famotidine (PEPCID) 20 MG tablet Take 1 tablet (20 mg total) by mouth 2 (two) times daily. 180 tablet 3  . Ferrous Sulfate (IRON PO) Take 1 tablet by mouth daily.    . fluticasone (FLONASE) 50 MCG/ACT nasal spray 1 spray per nostril 1-2 times daily as needed for nasal congestion. 1 g 5  . fluticasone-salmeterol (ADVAIR HFA) 230-21 MCG/ACT inhaler Inhale 2 puffs into the lungs 2 (two) times daily. with spacer and rinse mouth afterwards. 12 g 5  . gabapentin (NEURONTIN) 300 MG capsule Take 300 mg by mouth daily.     Marland Kitchen gabapentin (NEURONTIN) 300 MG capsule Take 1 capsule (300 mg total) by mouth 3 (three) times daily. 270 capsule 3  . glipiZIDE (GLUCOTROL) 10 MG tablet Take 10 mg by mouth daily.    Marland Kitchen glipiZIDE (GLUCOTROL) 10 MG tablet Take 1 tablet (10 mg total) by mouth daily. 90 tablet 3  . glucose blood test strip USE AS DIRECTED TWO TIMES DAILY 200 strip 3  . hydrochlorothiazide (HYDRODIURIL) 12.5 MG tablet Take 12.5  mg by mouth daily.    . hydrochlorothiazide (HYDRODIURIL) 12.5 MG tablet Take 1 tablet (12.5 mg total) by mouth daily. 90 tablet 3  . ibuprofen (ADVIL) 800 MG tablet Take 800 mg by mouth 3 (three) times daily.    . insulin degludec (TRESIBA) 100 UNIT/ML FlexTouch Pen Inject 10 Units into the skin daily.    Marland Kitchen ipratropium (ATROVENT) 0.03 % nasal spray Place 2 sprays into both nostrils 2 (two) times daily.    . Lancets (FREESTYLE) lancets USE AS DIRECTED TWO TIMES DAILY 200 each 3  . losartan (COZAAR) 50 MG tablet Take 50 mg by mouth daily.    Marland Kitchen losartan (COZAAR) 50 MG tablet Take 1 tablet (50 mg total) by mouth daily. 90 tablet 3  . omalizumab (XOLAIR) 150 MG injection INJECT 300 MG SUBCUTANEOUSLY EVERY 4 WEEKS. 2 each 10  . omalizumab (XOLAIR) 150 MG injection INJECT 300 MG SUBCUTANEOUSLY EVERY 4 WEEKS. 2 each 10  . Omega-3 Fatty Acids (FISH OIL) 1000 MG CAPS Take by mouth.    . ondansetron (ZOFRAN) 4 MG tablet Take 4 mg by mouth every 8 (eight) hours as needed for nausea or vomiting.    . pravastatin (PRAVACHOL) 20 MG tablet Take 20 mg by mouth at bedtime.     Marland Kitchen QVAR REDIHALER 40 MCG/ACT inhaler     . rosuvastatin (CRESTOR) 20 MG tablet Take 1 tablet (20 mg total) by mouth daily. 90 tablet 3  . sertraline (ZOLOFT) 50 MG tablet Take 50 mg by mouth at  bedtime.     . topiramate (TOPAMAX) 25 MG tablet     . triamcinolone ointment (KENALOG) 0.1 % Apply 1 application topically daily.    . valACYclovir (VALTREX) 1000 MG tablet Take 1 tablet (1,000 mg total) by mouth 3 (three) times daily. 21 tablet 0  . valACYclovir (VALTREX) 1000 MG tablet Take 1 tablet (1,000 mg total) by mouth daily. 90 tablet 3  . VITAMIN D PO Take 1 tablet by mouth daily.    . Vitamin D, Ergocalciferol, (DRISDOL) 1.25 MG (50000 UNIT) CAPS capsule Take 1 capsule (50,000 Units total) by mouth once a week. 12 capsule 3   Current Facility-Administered Medications  Medication Dose Route Frequency Provider Last Rate Last Admin  .  omalizumab Arvid Right) injection 300 mg  300 mg Subcutaneous Q28 days Valentina Shaggy, MD   300 mg at 06/16/21 1349   Allergies: Allergies  Allergen Reactions  . Percocet [Oxycodone-Acetaminophen] Hives   I reviewed her past medical history, social history, family history, and environmental history and no significant changes have been reported from her previous visit.  Review of Systems  Constitutional:  Negative for appetite change, chills, fever and unexpected weight change.  HENT:  Negative for congestion, rhinorrhea and sneezing.   Eyes:  Negative for itching.  Respiratory:  Negative for cough, chest tightness, shortness of breath and wheezing.   Cardiovascular:  Negative for chest pain.  Gastrointestinal:  Negative for abdominal pain.  Genitourinary:  Negative for difficulty urinating.  Musculoskeletal:        Left foot in a boot  Skin:  Negative for rash.  Allergic/Immunologic: Positive for environmental allergies. Negative for food allergies.  Neurological:  Negative for headaches.   Objective: There were no vitals taken for this visit. There is no height or weight on file to calculate BMI. Physical Exam Vitals and nursing note reviewed.  Constitutional:      Appearance: She is well-developed.  HENT:     Head: Normocephalic and atraumatic.     Right Ear: Tympanic membrane and external ear normal.     Left Ear: Tympanic membrane and external ear normal.     Nose: Nose normal.  Eyes:     Conjunctiva/sclera: Conjunctivae normal.  Cardiovascular:     Rate and Rhythm: Normal rate and regular rhythm.     Heart sounds:    No friction rub. No gallop.  Pulmonary:     Effort: Pulmonary effort is normal.     Breath sounds: Normal breath sounds. No wheezing or rales.  Musculoskeletal:     Cervical back: Neck supple.  Skin:    General: Skin is warm.     Findings: No rash.  Neurological:     Mental Status: She is alert and oriented to person, place, and time.   Psychiatric:        Behavior: Behavior normal.  Previous notes and tests were reviewed. The plan was reviewed with the patient/family, and all questions/concerned were addressed.  It was my pleasure to see Taylan today and participate in her care. Please feel free to contact me with any questions or concerns.  Sincerely,  Rexene Alberts, DO Allergy & Immunology  [facility] office: [place] office: [phone]

## 2021-07-09 ENCOUNTER — Ambulatory Visit: Payer: 59

## 2021-07-10 ENCOUNTER — Other Ambulatory Visit: Payer: Self-pay

## 2021-07-10 ENCOUNTER — Ambulatory Visit
Admission: RE | Admit: 2021-07-10 | Discharge: 2021-07-10 | Disposition: A | Discharge status: Home / Self Care | Payer: 59 | Source: Ambulatory Visit

## 2021-07-10 DIAGNOSIS — Z1231 Encounter for screening mammogram for malignant neoplasm of breast: Secondary | ICD-10-CM

## 2021-07-14 ENCOUNTER — Ambulatory Visit (INDEPENDENT_AMBULATORY_CARE_PROVIDER_SITE_OTHER): Payer: 59

## 2021-07-14 DIAGNOSIS — L501 Idiopathic urticaria: Secondary | ICD-10-CM | POA: Diagnosis not present

## 2021-07-14 DIAGNOSIS — L508 Other urticaria: Secondary | ICD-10-CM

## 2021-07-16 ENCOUNTER — Other Ambulatory Visit (HOSPITAL_COMMUNITY): Payer: Self-pay

## 2021-07-16 DIAGNOSIS — S8262XD Displaced fracture of lateral malleolus of left fibula, subsequent encounter for closed fracture with routine healing: Secondary | ICD-10-CM | POA: Diagnosis not present

## 2021-07-16 MED ORDER — PREDNISONE 10 MG (21) PO TBPK
ORAL_TABLET | ORAL | 0 refills | Status: DC
  Filled 2021-07-16: qty 21, 6d supply, fill #0

## 2021-07-16 MED FILL — Glucose Blood Test Strip: 90 days supply | Qty: 200 | Fill #2 | Status: CN

## 2021-07-16 MED FILL — Lancets: 50 days supply | Qty: 100 | Fill #2 | Status: CN

## 2021-07-17 ENCOUNTER — Other Ambulatory Visit: Payer: Self-pay

## 2021-07-17 DIAGNOSIS — R928 Other abnormal and inconclusive findings on diagnostic imaging of breast: Secondary | ICD-10-CM

## 2021-07-28 ENCOUNTER — Other Ambulatory Visit (HOSPITAL_COMMUNITY): Payer: Self-pay

## 2021-07-31 ENCOUNTER — Other Ambulatory Visit (HOSPITAL_COMMUNITY): Payer: Self-pay

## 2021-07-31 MED FILL — Omalizumab For Inj 150 MG: SUBCUTANEOUS | 28 days supply | Qty: 2 | Fill #6 | Status: AC

## 2021-08-05 ENCOUNTER — Ambulatory Visit: Payer: 59

## 2021-08-05 ENCOUNTER — Other Ambulatory Visit: Payer: 59

## 2021-08-05 ENCOUNTER — Other Ambulatory Visit: Payer: Self-pay

## 2021-08-05 ENCOUNTER — Ambulatory Visit
Admission: RE | Admit: 2021-08-05 | Discharge: 2021-08-05 | Disposition: A | Discharge status: Home / Self Care | Payer: 59 | Source: Ambulatory Visit

## 2021-08-05 ENCOUNTER — Other Ambulatory Visit: Payer: Self-pay | Admitting: Obstetrics and Gynecology

## 2021-08-05 DIAGNOSIS — R922 Inconclusive mammogram: Secondary | ICD-10-CM | POA: Diagnosis not present

## 2021-08-05 DIAGNOSIS — R928 Other abnormal and inconclusive findings on diagnostic imaging of breast: Secondary | ICD-10-CM

## 2021-08-06 ENCOUNTER — Other Ambulatory Visit (HOSPITAL_COMMUNITY): Payer: Self-pay

## 2021-08-06 ENCOUNTER — Other Ambulatory Visit: Payer: Self-pay

## 2021-08-06 DIAGNOSIS — I10 Essential (primary) hypertension: Secondary | ICD-10-CM | POA: Diagnosis not present

## 2021-08-06 DIAGNOSIS — E785 Hyperlipidemia, unspecified: Secondary | ICD-10-CM | POA: Diagnosis not present

## 2021-08-06 DIAGNOSIS — J309 Allergic rhinitis, unspecified: Secondary | ICD-10-CM | POA: Diagnosis not present

## 2021-08-06 DIAGNOSIS — R000 Tachycardia, unspecified: Secondary | ICD-10-CM | POA: Diagnosis not present

## 2021-08-06 DIAGNOSIS — F411 Generalized anxiety disorder: Secondary | ICD-10-CM | POA: Diagnosis not present

## 2021-08-06 DIAGNOSIS — J4520 Mild intermittent asthma, uncomplicated: Secondary | ICD-10-CM | POA: Diagnosis not present

## 2021-08-06 DIAGNOSIS — K219 Gastro-esophageal reflux disease without esophagitis: Secondary | ICD-10-CM | POA: Diagnosis not present

## 2021-08-06 DIAGNOSIS — F329 Major depressive disorder, single episode, unspecified: Secondary | ICD-10-CM | POA: Diagnosis not present

## 2021-08-06 DIAGNOSIS — E119 Type 2 diabetes mellitus without complications: Secondary | ICD-10-CM | POA: Diagnosis not present

## 2021-08-06 MED ORDER — CETIRIZINE HCL 10 MG PO TABS
10.0000 mg | ORAL_TABLET | Freq: Every day | ORAL | 3 refills | Status: DC
  Filled 2021-08-06 – 2022-03-21 (×3): qty 90, 90d supply, fill #0
  Filled 2022-04-17 – 2022-07-07 (×4): qty 90, 90d supply, fill #1

## 2021-08-06 MED ORDER — ROSUVASTATIN CALCIUM 20 MG PO TABS
20.0000 mg | ORAL_TABLET | Freq: Every day | ORAL | 3 refills | Status: DC
  Filled 2021-08-06 – 2021-10-06 (×2): qty 90, 90d supply, fill #0
  Filled 2021-12-09: qty 90, 90d supply, fill #1

## 2021-08-06 MED ORDER — SERTRALINE HCL 100 MG PO TABS
100.0000 mg | ORAL_TABLET | Freq: Every day | ORAL | 3 refills | Status: DC
  Filled 2021-08-06: qty 90, 90d supply, fill #0
  Filled 2021-10-28: qty 90, 90d supply, fill #1
  Filled 2021-12-09: qty 90, 90d supply, fill #2

## 2021-08-06 MED ORDER — FAMOTIDINE 20 MG PO TABS
20.0000 mg | ORAL_TABLET | Freq: Two times a day (BID) | ORAL | 3 refills | Status: DC
  Filled 2021-08-06: qty 180, 90d supply, fill #0

## 2021-08-06 MED ORDER — FLUTICASONE-SALMETEROL 230-21 MCG/ACT IN AERO
2.0000 | INHALATION_SPRAY | Freq: Two times a day (BID) | RESPIRATORY_TRACT | 6 refills | Status: DC
  Filled 2021-08-06 – 2021-10-06 (×2): qty 12, 30d supply, fill #0
  Filled 2021-10-28: qty 12, 30d supply, fill #1

## 2021-08-06 MED ORDER — FLUTICASONE PROPIONATE 50 MCG/ACT NA SUSP
2.0000 | Freq: Every day | NASAL | 3 refills | Status: DC
  Filled 2021-08-06: qty 16, 30d supply, fill #0
  Filled 2021-10-06: qty 16, 30d supply, fill #1
  Filled 2021-12-09: qty 16, 30d supply, fill #2
  Filled 2022-03-21: qty 16, 30d supply, fill #3

## 2021-08-06 MED ORDER — GABAPENTIN 300 MG PO CAPS
300.0000 mg | ORAL_CAPSULE | Freq: Three times a day (TID) | ORAL | 3 refills | Status: DC
  Filled 2021-08-06 – 2022-03-21 (×2): qty 270, 90d supply, fill #0
  Filled 2022-04-17 – 2022-07-07 (×4): qty 270, 90d supply, fill #1

## 2021-08-06 MED ORDER — HYDROCHLOROTHIAZIDE 12.5 MG PO TABS
12.5000 mg | ORAL_TABLET | Freq: Every day | ORAL | 3 refills | Status: DC
  Filled 2021-08-06 – 2021-12-09 (×2): qty 90, 90d supply, fill #0

## 2021-08-06 MED ORDER — ALBUTEROL SULFATE HFA 108 (90 BASE) MCG/ACT IN AERS
2.0000 | INHALATION_SPRAY | RESPIRATORY_TRACT | 3 refills | Status: DC
  Filled 2021-08-06: qty 18, 17d supply, fill #0
  Filled 2021-10-06: qty 18, 17d supply, fill #1
  Filled 2021-10-28: qty 18, 17d supply, fill #2

## 2021-08-06 MED ORDER — GLIPIZIDE 10 MG PO TABS
10.0000 mg | ORAL_TABLET | Freq: Every day | ORAL | 3 refills | Status: DC
  Filled 2021-08-06 – 2021-10-06 (×2): qty 90, 90d supply, fill #0

## 2021-08-06 MED ORDER — LOSARTAN POTASSIUM 50 MG PO TABS
50.0000 mg | ORAL_TABLET | Freq: Every day | ORAL | 3 refills | Status: DC
  Filled 2021-08-06 – 2021-12-09 (×2): qty 90, 90d supply, fill #0

## 2021-08-07 ENCOUNTER — Other Ambulatory Visit (HOSPITAL_COMMUNITY): Payer: Self-pay

## 2021-08-11 ENCOUNTER — Ambulatory Visit (INDEPENDENT_AMBULATORY_CARE_PROVIDER_SITE_OTHER): Payer: 59

## 2021-08-11 ENCOUNTER — Other Ambulatory Visit: Payer: Self-pay

## 2021-08-11 DIAGNOSIS — L501 Idiopathic urticaria: Secondary | ICD-10-CM

## 2021-08-12 ENCOUNTER — Other Ambulatory Visit (HOSPITAL_COMMUNITY): Payer: Self-pay

## 2021-08-13 ENCOUNTER — Other Ambulatory Visit: Payer: Self-pay

## 2021-08-13 DIAGNOSIS — N951 Menopausal and female climacteric states: Secondary | ICD-10-CM

## 2021-08-15 DIAGNOSIS — E7800 Pure hypercholesterolemia, unspecified: Secondary | ICD-10-CM | POA: Diagnosis not present

## 2021-08-15 DIAGNOSIS — R079 Chest pain, unspecified: Secondary | ICD-10-CM | POA: Diagnosis not present

## 2021-08-15 DIAGNOSIS — I10 Essential (primary) hypertension: Secondary | ICD-10-CM | POA: Diagnosis not present

## 2021-08-15 DIAGNOSIS — R000 Tachycardia, unspecified: Secondary | ICD-10-CM | POA: Diagnosis not present

## 2021-08-22 DIAGNOSIS — R000 Tachycardia, unspecified: Secondary | ICD-10-CM | POA: Diagnosis not present

## 2021-08-22 DIAGNOSIS — I10 Essential (primary) hypertension: Secondary | ICD-10-CM | POA: Diagnosis not present

## 2021-08-22 DIAGNOSIS — R079 Chest pain, unspecified: Secondary | ICD-10-CM | POA: Diagnosis not present

## 2021-08-26 ENCOUNTER — Ambulatory Visit: Payer: 59 | Attending: Family Medicine | Admitting: Pharmacist

## 2021-08-26 ENCOUNTER — Other Ambulatory Visit (HOSPITAL_COMMUNITY): Payer: Self-pay

## 2021-08-26 ENCOUNTER — Other Ambulatory Visit: Payer: Self-pay

## 2021-08-26 DIAGNOSIS — Z79899 Other long term (current) drug therapy: Secondary | ICD-10-CM

## 2021-08-26 NOTE — Progress Notes (Signed)
   S: Patient presents for review of their specialty medication therapy.  Patient is currently taking Xolair for chronic urticaria. Patient is managed by Dr. Selena Batten for this.   Adherence: has been on Xolair for about a year now.   Efficacy: reports good results  Dosing: Give subcutaneously.  Can be dosed every 2 or 4 weeks based on baseline serum IgE levels and body weight. Chronic idiopathic urticaria: SubQ: 150 or 300 mg every 4 weeks. Dosing is not dependent on serum IgE (free or total) level or body weight.  Dose adjustments: Renal: no dose adjustments  Hepatic: no dose adjustments  Toxicity: Severe hypersensitivity reaction or anaphylaxis: Discontinue treatment. Fever, arthralgia, and rash: Discontinue treatment if this constellation of symptoms occurs.  Drug-drug interactions: none identified   Monitoring: CV effects: denied  Eosinophilia and vasculitis: denied  Fever/arthralgia/rash: denied  Hypersensitivity/Anaphylaxis: denied  Malignant neoplasms: denied  Pulmonary function tests (for asthma): denied    O:     Lab Results  Component Value Date   WBC 5.8 03/23/2019   HGB 11.2 03/23/2019   HCT 34.8 03/23/2019   MCV 80 03/23/2019   PLT 259 03/23/2019      Chemistry      Component Value Date/Time   NA 140 10/28/2020 1100   NA 139 03/23/2019 1601   K 4.2 10/28/2020 1100   CL 103 10/28/2020 1100   CO2 27 10/28/2020 1100   BUN 11 10/28/2020 1100   BUN 15 03/23/2019 1601   CREATININE 0.71 10/28/2020 1100      Component Value Date/Time   CALCIUM 9.3 10/28/2020 1100   ALKPHOS 108 03/23/2019 1601   AST 11 03/23/2019 1601   ALT 19 03/23/2019 1601   BILITOT 0.6 03/23/2019 1601       A/P: 1. Medication review: patient currently on Xolair for chronic urticaria. Reviewed the medication with the patient, including the following: Xolair, omalizumab, is a novel IgE blocker.  It appears to reduce rates of hospitalizations, ER visits and unscheduled physician visits  due asthma exacerbations when added to standard therapy.  Studies also show a reduction in steroid requirements and improvement in quality of life.  Patient educated on purpose, proper use and potential adverse effects of Xolair.  Following instruction patient verbalized understanding. Patient should always have an EpiPen readily available in the event of anaphylaxis. SubQ: For SubQ injection only; doses >150 mg should be divided over more than one injection site (eg, 225 mg or 300 mg administered as two injections, 375 mg administered as three injections); each injection site should be separated by ?1 inch. Do not inject into moles, scars, bruises, tender areas, or broken skin. Injections may take 5 to 10 seconds to administer (solution is slightly viscous). Administer only under direct medical supervision and observe patient for 2 hours after the first 3 injections and 30 minutes after subsequent injections Angela Nevin 2015) or in accordance with individual institution policies and procedures.No recommendations for any changes at this time.   Butch Penny, PharmD, Patsy [place], CPP Clinical Pharmacist [facility] [phone]

## 2021-08-27 ENCOUNTER — Other Ambulatory Visit (HOSPITAL_COMMUNITY): Payer: Self-pay

## 2021-08-28 ENCOUNTER — Other Ambulatory Visit (HOSPITAL_COMMUNITY): Payer: Self-pay

## 2021-09-01 ENCOUNTER — Other Ambulatory Visit (HOSPITAL_COMMUNITY): Payer: Self-pay

## 2021-09-01 ENCOUNTER — Other Ambulatory Visit: Payer: Self-pay | Admitting: Allergy

## 2021-09-01 NOTE — Telephone Encounter (Signed)
Please advise 

## 2021-09-02 DIAGNOSIS — R000 Tachycardia, unspecified: Secondary | ICD-10-CM | POA: Diagnosis not present

## 2021-09-02 DIAGNOSIS — R079 Chest pain, unspecified: Secondary | ICD-10-CM | POA: Diagnosis not present

## 2021-09-02 MED ORDER — XOLAIR 150 MG ~~LOC~~ SOLR
SUBCUTANEOUS | 10 refills | Status: DC
  Filled 2021-09-02: qty 2, fill #0

## 2021-09-03 ENCOUNTER — Other Ambulatory Visit (HOSPITAL_COMMUNITY): Payer: Self-pay

## 2021-09-03 ENCOUNTER — Other Ambulatory Visit: Payer: Self-pay | Admitting: Pharmacist

## 2021-09-03 MED ORDER — XOLAIR 150 MG ~~LOC~~ SOLR
SUBCUTANEOUS | 10 refills | Status: DC
  Filled 2021-09-03: qty 2, 28d supply, fill #0
  Filled 2021-10-01: qty 2, 28d supply, fill #1
  Filled 2021-10-27: qty 2, 28d supply, fill #2
  Filled 2021-11-24: qty 2, 28d supply, fill #3
  Filled 2021-12-22: qty 2, 28d supply, fill #4
  Filled 2022-01-19 – 2022-02-03 (×2): qty 2, 28d supply, fill #5
  Filled 2022-03-02: qty 2, 28d supply, fill #6
  Filled 2022-03-25: qty 2, 28d supply, fill #7
  Filled 2022-04-16: qty 2, 28d supply, fill #8
  Filled 2022-05-22: qty 2, 28d supply, fill #9
  Filled 2022-06-25: qty 2, 28d supply, fill #10

## 2021-09-08 ENCOUNTER — Other Ambulatory Visit (HOSPITAL_COMMUNITY): Payer: Self-pay

## 2021-09-09 ENCOUNTER — Other Ambulatory Visit (HOSPITAL_COMMUNITY): Payer: Self-pay

## 2021-09-09 MED ORDER — CARESTART COVID-19 HOME TEST VI KIT
PACK | 0 refills | Status: DC
  Filled 2021-09-09: qty 4, 4d supply, fill #0

## 2021-09-10 ENCOUNTER — Other Ambulatory Visit: Payer: Self-pay

## 2021-09-10 ENCOUNTER — Ambulatory Visit (INDEPENDENT_AMBULATORY_CARE_PROVIDER_SITE_OTHER): Payer: 59

## 2021-09-10 DIAGNOSIS — L501 Idiopathic urticaria: Secondary | ICD-10-CM | POA: Diagnosis not present

## 2021-09-11 ENCOUNTER — Ambulatory Visit
Admission: RE | Admit: 2021-09-11 | Discharge: 2021-09-11 | Disposition: A | Discharge status: Home / Self Care | Payer: 59 | Source: Ambulatory Visit

## 2021-09-11 DIAGNOSIS — Z780 Asymptomatic menopausal state: Secondary | ICD-10-CM | POA: Diagnosis not present

## 2021-09-11 DIAGNOSIS — E119 Type 2 diabetes mellitus without complications: Secondary | ICD-10-CM | POA: Diagnosis not present

## 2021-09-11 DIAGNOSIS — N951 Menopausal and female climacteric states: Secondary | ICD-10-CM

## 2021-09-14 DIAGNOSIS — R059 Cough, unspecified: Secondary | ICD-10-CM | POA: Diagnosis not present

## 2021-09-16 NOTE — Progress Notes (Signed)
RE: Shelley Mccoy MRN: [id number] DOB: 01/04/1971 Date of Telemedicine Visit: 09/17/2021  Referring provider: Corinna Lines, * Primary care provider: Corinna Lines, MD  Chief Complaint: Other (Covid - 09/16/21 - tested positive yesterday. Coughing, sore throat, loss of taste, ), Asthma (Has been using her neb - as needed), and Urticaria (No issues )   Telemedicine Follow Up Visit via Telephone: I connected with Shelley Mccoy for a follow up on 09/17/21 by telephone and verified that I am speaking with the correct person using two identifiers.   I discussed the limitations, risks, security and privacy concerns of performing an evaluation and management service by telephone and the availability of in person appointments. I also discussed with the patient that there may be a patient responsible charge related to this service. The patient expressed understanding and agreed to proceed.  Patient is at home. Provider is at the office.  Visit start time: 12:00PM Visit end time: 12:10PM Insurance consent/check in by: front desk Medical consent and medical assistant/nurse: Diandra D.  History of Present Illness: She is a 50 y.o. female, who is being followed for CIU on Xolair 300 mg every 5 weeks, asthma, allergic rhinoconjunctivitis. Her previous allergy office visit was on 04/02/2021 with Dr. Maudie Mercury. Today is a regular follow up visit.  Chronic urticaria Currently on Xolair injections $RemoveBeforeDEI'300mg'mmbZibcmLnQHsbbb$  every 4 weeks with good benefit. Unable to do the every 5 weeks regimen.   Moderate persistent asthma  Asthma was doing well prior to covid-19 with the change of inhalers.  Patient got covid-19 this week.  Having issues with fevers/chills, headaches, sore throat.  Using albuterol as needed with good benefit. No sick contacts at home and has been isolating herself.   Seasonal and perennial allergic rhinoconjunctivitis Stable with Flonase as needed and zyrtec $RemoveBe'10mg'aTiMXXGke$  daily.   Assessment  and Plan: Shelley Mccoy is a 50 y.o. female with: Chronic urticaria Past history - Breaking out in rash/hives for the past 3 years.  Urticaria panel unremarkable.  Interim history - unable to go to every 5 weeks.  Continue Xolair injections $RemoveBeforeDEI'300mg'omDUIpDFlDlKFLPV$  every 4 weeks.   Continue with zyrtec $RemoveBef'10mg'lvWcmqwYSC$  daily - also for allergies.  Monitor symptoms.  Avoid the following potential triggers: alcohol, tight clothing, NSAIDs.   Moderate persistent asthma without complication Past history - Diagnosed with asthma 4 years ago.  2020 spirometry showed: possible restrictive disease and 21% improvement in FEV1 post bronchodilator treatment.   Interim history - was doing well with increased Advair dose but now having some coughing with Covid-19 infection. Daily controller medication(s): Advair 221mcg 2 puffs twice a day with spacer and rinse mouth afterwards. Prior to physical activity: May use albuterol rescue inhaler 2 puffs 5 to 15 minutes prior to strenuous physical activities. Rescue medications: May use albuterol rescue inhaler 2 puffs or nebulizer every 4 to 6 hours as needed for shortness of breath, chest tightness, coughing, and wheezing. Monitor frequency of use.  Get spirometry at next visit.  COVID-19 Tested positive this week and not feeling well. Follow guidelines regarding isolation. Take Paxlovid as prescribed. Take tylenol or ibuprofen for the pain and fever. If your asthma flares let us know and will send a prescription for a nebulizer machine.  See below for symptomatic management.   Seasonal and perennial allergic rhinoconjunctivitis Past history - Rhinoconjunctivitis symptoms for the past 5 to 10 years mainly during the pollen seasons.  Using Allegra and Atrovent nasal spray with some benefit. 2020 skin testing showed: Positive to ragweed and dust  mites, molds and cockroaches. Continue environmental control measures. Continue Flonase 1 spray per nostril 1-2 times a day as needed for nasal  congestion.  May use over the counter antihistamines such as Zyrtec (cetirizine), Claritin (loratadine), Allegra (fexofenadine), or Xyzal (levocetirizine) daily as needed.  Return in about 3 months (around 12/16/2021).  Meds ordered this encounter  Medications   nirmatrelvir & ritonavir (PAXLOVID, 300/100,) 20 x 150 MG & 10 x $Re'100MG'GGA$  TBPK    Sig: Take 3 tablets by mouth twice a day (3 tablets total - 2 pink and 1 white tablet) for 5 days.    Dispense:  30 tablet    Refill:  0   Lab Orders  No laboratory test(s) ordered today    Diagnostics: None.  Medication List:  Current Outpatient Medications  Medication Sig Dispense Refill   albuterol (VENTOLIN HFA) 108 (90 Base) MCG/ACT inhaler Inhale 2 puffs into the lungs every 4 (four) hours. 18 g 3   Ascorbic Acid (VITAMIN C PO) Take 1 tablet by mouth daily.     cetirizine (ZYRTEC) 10 MG tablet Take 1 tablet (10 mg total) by mouth daily. 90 tablet 3   conjugated estrogens (PREMARIN) vaginal cream Place 1 Applicatorful vaginally daily. 42.5 g 12   COVID-19 At Home Antigen Test (CARESTART COVID-19 HOME TEST) KIT Use as directed 4 each 0   cyclobenzaprine (FLEXERIL) 10 MG tablet      EPINEPHrine 0.3 mg/0.3 mL IJ SOAJ injection      Ferrous Sulfate (IRON PO) Take 1 tablet by mouth daily.     fluticasone (FLONASE) 50 MCG/ACT nasal spray Place 2 sprays into both nostrils daily. 16 g 3   fluticasone-salmeterol (ADVAIR HFA) 230-21 MCG/ACT inhaler Inhale 2 puffs into the lungs 2 (two) times daily. 12 g 6   gabapentin (NEURONTIN) 300 MG capsule Take 300 mg by mouth daily.      gabapentin (NEURONTIN) 300 MG capsule Take 1 capsule (300 mg total) by mouth 3 (three) times daily. 270 capsule 3   gabapentin (NEURONTIN) 300 MG capsule Take 1 capsule (300 mg total) by mouth 3 (three) times daily. 270 capsule 3   glipiZIDE (GLUCOTROL) 10 MG tablet Take 10 mg by mouth daily.     glipiZIDE (GLUCOTROL) 10 MG tablet Take 1 tablet (10 mg total) by mouth daily. 90  tablet 3   glipiZIDE (GLUCOTROL) 10 MG tablet Take 1 tablet (10 mg total) by mouth daily. 90 tablet 3   hydrochlorothiazide (HYDRODIURIL) 12.5 MG tablet Take 12.5 mg by mouth daily.     hydrochlorothiazide (HYDRODIURIL) 12.5 MG tablet Take 1 tablet (12.5 mg total) by mouth daily. 90 tablet 3   hydrochlorothiazide (HYDRODIURIL) 12.5 MG tablet Take 1 tablet (12.5 mg total) by mouth daily. 90 tablet 3   ibuprofen (ADVIL) 800 MG tablet Take 800 mg by mouth 3 (three) times daily.     insulin degludec (TRESIBA) 100 UNIT/ML FlexTouch Pen Inject 10 Units into the skin daily.     ipratropium (ATROVENT) 0.03 % nasal spray Place 2 sprays into both nostrils 2 (two) times daily.     losartan (COZAAR) 50 MG tablet Take 50 mg by mouth daily.     losartan (COZAAR) 50 MG tablet Take 1 tablet (50 mg total) by mouth daily. 90 tablet 3   losartan (COZAAR) 50 MG tablet Take 1 tablet (50 mg total) by mouth daily. 90 tablet 3   nirmatrelvir & ritonavir (PAXLOVID, 300/100,) 20 x 150 MG & 10 x $Re'100MG'XGW$  TBPK Take 3  tablets by mouth twice a day (3 tablets total - 2 pink and 1 white tablet) for 5 days. 30 tablet 0   omalizumab (XOLAIR) 150 MG injection INJECT 300 MG SUBCUTANEOUSLY EVERY 4 WEEKS. 2 each 10   omalizumab (XOLAIR) 150 MG injection INJECT 300 MG SUBCUTANEOUSLY EVERY 4 WEEKS. 2 each 10   Omega-3 Fatty Acids (FISH OIL) 1000 MG CAPS Take by mouth.     ondansetron (ZOFRAN) 4 MG tablet Take 4 mg by mouth every 8 (eight) hours as needed for nausea or vomiting.     pravastatin (PRAVACHOL) 20 MG tablet Take 20 mg by mouth at bedtime.      rosuvastatin (CRESTOR) 20 MG tablet Take 1 tablet (20 mg total) by mouth daily. 90 tablet 3   rosuvastatin (CRESTOR) 20 MG tablet Take 1 tablet (20 mg total) by mouth daily. 90 tablet 3   sertraline (ZOLOFT) 100 MG tablet Take 1 tablet (100 mg total) by mouth daily. 90 tablet 3   sertraline (ZOLOFT) 50 MG tablet Take 50 mg by mouth at bedtime.      topiramate (TOPAMAX) 25 MG tablet       triamcinolone ointment (KENALOG) 0.1 % Apply 1 application topically daily.     valACYclovir (VALTREX) 1000 MG tablet Take 1 tablet (1,000 mg total) by mouth 3 (three) times daily. 21 tablet 0   valACYclovir (VALTREX) 1000 MG tablet Take 1 tablet (1,000 mg total) by mouth daily. 90 tablet 3   VITAMIN D PO Take 1 tablet by mouth daily.     Vitamin D, Ergocalciferol, (DRISDOL) 1.25 MG (50000 UNIT) CAPS capsule Take 1 capsule (50,000 Units total) by mouth once a week. 12 capsule 3   Current Facility-Administered Medications  Medication Dose Route Frequency Provider Last Rate Last Admin   omalizumab Arvid Right) injection 300 mg  300 mg Subcutaneous Q28 days Valentina Shaggy, MD   300 mg at 09/10/21 1342   Allergies: Allergies  Allergen Reactions   Percocet [Oxycodone-Acetaminophen] Hives   I reviewed her past medical history, social history, family history, and environmental history and no significant changes have been reported from her previous visit.  Review of Systems  Constitutional:  Positive for chills, fatigue and fever. Negative for appetite change and unexpected weight change.  HENT:  Positive for congestion and sore throat. Negative for rhinorrhea and sneezing.   Eyes:  Negative for itching.  Respiratory:  Positive for cough. Negative for chest tightness, shortness of breath and wheezing.   Cardiovascular:  Negative for chest pain.  Gastrointestinal:  Negative for abdominal pain.  Genitourinary:  Negative for difficulty urinating.  Musculoskeletal:        Left foot in a boot  Skin:  Negative for rash.  Allergic/Immunologic: Positive for environmental allergies. Negative for food allergies.  Neurological:  Positive for headaches.   Objective: Physical Exam Not obtained as encounter was done via telephone.   Previous notes and tests were reviewed.  I discussed the assessment and treatment plan with the patient. The patient was provided an opportunity to ask questions and  all were answered. The patient agreed with the plan and demonstrated an understanding of the instructions. After visit summary/patient instructions available via mychart.   The patient was advised to call back or seek an in-person evaluation if the symptoms worsen or if the condition fails to improve as anticipated.  I provided 10 minutes of non-face-to-face time during this encounter.  It was my pleasure to participate in [place] Camper's care today. Please  feel free to contact me with any questions or concerns.   Sincerely,  Rexene Alberts, DO Allergy & Immunology  [facility] office: [place] office: [phone]

## 2021-09-17 ENCOUNTER — Other Ambulatory Visit: Payer: Self-pay

## 2021-09-17 ENCOUNTER — Other Ambulatory Visit (HOSPITAL_COMMUNITY): Payer: Self-pay

## 2021-09-17 ENCOUNTER — Ambulatory Visit (INDEPENDENT_AMBULATORY_CARE_PROVIDER_SITE_OTHER): Payer: 59 | Admitting: Allergy

## 2021-09-17 ENCOUNTER — Encounter: Payer: Self-pay | Admitting: Allergy

## 2021-09-17 VITALS — Temp 99.5°F

## 2021-09-17 DIAGNOSIS — J302 Other seasonal allergic rhinitis: Secondary | ICD-10-CM | POA: Diagnosis not present

## 2021-09-17 DIAGNOSIS — U071 COVID-19: Secondary | ICD-10-CM | POA: Diagnosis not present

## 2021-09-17 DIAGNOSIS — J3089 Other allergic rhinitis: Secondary | ICD-10-CM | POA: Diagnosis not present

## 2021-09-17 DIAGNOSIS — H1010 Acute atopic conjunctivitis, unspecified eye: Secondary | ICD-10-CM

## 2021-09-17 DIAGNOSIS — J4540 Moderate persistent asthma, uncomplicated: Secondary | ICD-10-CM | POA: Diagnosis not present

## 2021-09-17 DIAGNOSIS — L508 Other urticaria: Secondary | ICD-10-CM | POA: Diagnosis not present

## 2021-09-17 MED ORDER — PAXLOVID (300/100) 20 X 150 MG & 10 X 100MG PO TBPK
ORAL_TABLET | ORAL | 0 refills | Status: DC
  Filled 2021-09-17: qty 30, 5d supply, fill #0

## 2021-09-17 NOTE — Assessment & Plan Note (Signed)
Past history - Rhinoconjunctivitis symptoms for the past 5 to 10 years mainly during the pollen seasons.  Using Allegra and Atrovent nasal spray with some benefit. 2020 skin testing showed: Positive to ragweed and dust mites, molds and cockroaches.  Continue environmental control measures.  Continue Flonase 1 spray per nostril 1-2 times a day as needed for nasal congestion.   May use over the counter antihistamines such as Zyrtec (cetirizine), Claritin (loratadine), Allegra (fexofenadine), or Xyzal (levocetirizine) daily as needed.

## 2021-09-17 NOTE — Assessment & Plan Note (Signed)
Past history - Diagnosed with asthma 4 years ago.  2020 spirometry showed: possible restrictive disease and 21% improvement in FEV1 post bronchodilator treatment.   Interim history - was doing well with increased Advair dose but now having some coughing with Covid-19 infection.  Daily controller medication(s): Advair 2 puffs twice a day with spacer and rinse mouth afterwards.  Prior to physical activity: May use albuterol rescue inhaler 2 puffs 5 to 15 minutes prior to strenuous physical activities.  Rescue medications: May use albuterol rescue inhaler 2 puffs or nebulizer every 4 to 6 hours as needed for shortness of breath, chest tightness, coughing, and wheezing. Monitor frequency of use.   Get spirometry at next visit.

## 2021-09-17 NOTE — Patient Instructions (Addendum)
Covid-19 infection Follow guidelines regarding isolation. Take Paxlovid as prescribed. Take tylenol or ibuprofen for the pain and fever. If your asthma flares let us know and will send a prescription for a nebulizer machine.   Chronic urticaria Xolair injections 300mg  every 4 weeks.   Continue with zyrtec 10mg  daily - also for allergies.  Monitor symptoms.  Avoid the following potential triggers: alcohol, tight clothing, NSAIDs.    Moderate persistent asthma  Daily controller medication(s): Advair 2 puffs twice a day with spacer and rinse mouth afterwards. Prior to physical activity: May use albuterol rescue inhaler 2 puffs 5 to 15 minutes prior to strenuous physical activities. Rescue medications: May use albuterol rescue inhaler 2 puffs or nebulizer every 4 to 6 hours as needed for shortness of breath, chest tightness, coughing, and wheezing. Monitor frequency of use.  Asthma control goals:  Full participation in all desired activities (may need albuterol before activity) Albuterol use two times or less a week on average (not counting use with activity) Cough interfering with sleep two times or less a month Oral steroids no more than once a year No hospitalizations   Seasonal and perennial allergic rhinoconjunctivitis 2020 skin testing showed: Positive to ragweed and dust mites, molds and cockroaches. Continue environmental control measures. Continue Flonase 1 spray per nostril 1-2 times a day as needed for nasal congestion.  May use over the counter antihistamines such as Zyrtec (cetirizine), Claritin (loratadine), Allegra (fexofenadine), or Xyzal (levocetirizine) daily as needed.  Follow up in 3 months or sooner if needed.  Drink plenty of fluids. Water, juice, clear broth or warm lemon water are good choices. Avoid caffeine and alcohol, which can dehydrate you. Eat chicken soup. Chicken soup and other warm fluids can be soothing and loosen congestion. Rest. Adjust your  room's temperature and humidity. Keep your room warm but not overheated. If the air is dry, a cool-mist humidifier or vaporizer can moisten the air and help ease congestion and coughing. Keep the humidifier clean to prevent the growth of bacteria and molds. Soothe your throat. Perform a saltwater gargle. Dissolve one-quarter to a half teaspoon of salt in a 4- to 8-ounce glass of warm water. This can relieve a sore or scratchy throat temporarily. Use saline nasal drops. To help relieve nasal congestion, try saline nasal drops. You can buy these drops over the counter, and they can help relieve symptoms ? even in children. Take over-the-counter cold and cough medications. For adults and children older than 5, over-the-counter decongestants, antihistamines and pain relievers might offer some symptom relief. However, they won't prevent a cold or shorten its duration.

## 2021-09-17 NOTE — Assessment & Plan Note (Signed)
>>  ASSESSMENT AND PLAN FOR MODERATE PERSISTENT ASTHMA WITHOUT COMPLICATION WRITTEN ON 09/17/2021  1:44 PM BY Ellamae Sia, DO  Past history - Diagnosed with asthma 4 years ago.  2020 spirometry showed: possible restrictive disease and 21% improvement in FEV1 post bronchodilator treatment.   Interim history - was doing well with increased Advair dose but now having some coughing with Covid-19 infection.  Daily controller medication(s): Advair 2 puffs twice a day with spacer and rinse mouth afterwards.  Prior to physical activity: May use albuterol rescue inhaler 2 puffs 5 to 15 minutes prior to strenuous physical activities.  Rescue medications: May use albuterol rescue inhaler 2 puffs or nebulizer every 4 to 6 hours as needed for shortness of breath, chest tightness, coughing, and wheezing. Monitor frequency of use.   Get spirometry at next visit.

## 2021-09-17 NOTE — Assessment & Plan Note (Signed)
Tested positive this week and not feeling well.  Follow guidelines regarding isolation.  Take Paxlovid as prescribed.  Take tylenol or ibuprofen for the pain and fever.  If your asthma flares let us know and will send a prescription for a nebulizer machine.   See below for symptomatic management.

## 2021-09-17 NOTE — Assessment & Plan Note (Signed)
Past history - Breaking out in rash/hives for the past 3 years.  Urticaria panel unremarkable.  Interim history - unable to go to every 5 weeks.   Continue Xolair injections 300mg  every 4 weeks.    Continue with zyrtec 10mg  daily - also for allergies.   Monitor symptoms.   Avoid the following potential triggers: alcohol, tight clothing, NSAIDs.

## 2021-09-24 ENCOUNTER — Other Ambulatory Visit (HOSPITAL_COMMUNITY): Payer: Self-pay

## 2021-09-24 MED ORDER — BENZONATATE 200 MG PO CAPS
200.0000 mg | ORAL_CAPSULE | Freq: Three times a day (TID) | ORAL | 3 refills | Status: DC
  Filled 2021-09-24: qty 30, 10d supply, fill #0
  Filled 2021-10-06: qty 30, 10d supply, fill #1
  Filled 2021-12-09: qty 30, 10d supply, fill #2
  Filled 2022-03-18: qty 30, 10d supply, fill #3

## 2021-10-01 ENCOUNTER — Other Ambulatory Visit (HOSPITAL_COMMUNITY): Payer: Self-pay

## 2021-10-06 ENCOUNTER — Other Ambulatory Visit (HOSPITAL_COMMUNITY): Payer: Self-pay

## 2021-10-08 ENCOUNTER — Ambulatory Visit (INDEPENDENT_AMBULATORY_CARE_PROVIDER_SITE_OTHER): Payer: 59

## 2021-10-08 ENCOUNTER — Other Ambulatory Visit: Payer: Self-pay

## 2021-10-08 ENCOUNTER — Ambulatory Visit: Payer: 59

## 2021-10-08 DIAGNOSIS — L501 Idiopathic urticaria: Secondary | ICD-10-CM

## 2021-10-08 DIAGNOSIS — L508 Other urticaria: Secondary | ICD-10-CM

## 2021-10-09 ENCOUNTER — Ambulatory Visit: Payer: 59

## 2021-10-24 ENCOUNTER — Encounter: Payer: Self-pay | Admitting: Obstetrics

## 2021-10-24 ENCOUNTER — Other Ambulatory Visit (HOSPITAL_COMMUNITY)
Admission: RE | Admit: 2021-10-24 | Discharge: 2021-10-24 | Disposition: A | Discharge status: Home / Self Care | Payer: 59 | Source: Ambulatory Visit | Attending: Obstetrics and Gynecology | Admitting: Obstetrics and Gynecology

## 2021-10-24 ENCOUNTER — Other Ambulatory Visit: Payer: Self-pay

## 2021-10-24 ENCOUNTER — Ambulatory Visit (INDEPENDENT_AMBULATORY_CARE_PROVIDER_SITE_OTHER): Payer: 59 | Admitting: Obstetrics

## 2021-10-24 VITALS — BP 150/79 | HR 72 | Ht <= 58 in | Wt 231.0 lb

## 2021-10-24 DIAGNOSIS — N950 Postmenopausal bleeding: Secondary | ICD-10-CM | POA: Diagnosis not present

## 2021-10-24 DIAGNOSIS — N939 Abnormal uterine and vaginal bleeding, unspecified: Secondary | ICD-10-CM

## 2021-10-24 DIAGNOSIS — N898 Other specified noninflammatory disorders of vagina: Secondary | ICD-10-CM | POA: Insufficient documentation

## 2021-10-24 NOTE — Progress Notes (Addendum)
GYN presents for postmenopausal bleeding. C/o blood on tissue when she wipes, on condom during sex, and sometimes dry blood.  Last PAP 07/05/2017, had Hysterectomy. Last Mammogram 07/10/21

## 2021-10-24 NOTE — Progress Notes (Signed)
Patient ID: Shelley Mccoy, female   DOB: 1971-08-31, 51 y.o.   MRN: [id number]  Chief Complaint  Patient presents with   Gynecologic Exam    HPI Shelley Mccoy is a 51 y.o. female.  Complains of vaginal spotting and dark bloody discharge on tissue when she wipes., and also blood on condom after sexual intercourse. HPI  Past Medical History:  Diagnosis Date   Anemia    Angioedema of lips 03/22/2019   Asthma    Bell's palsy    Chronic urticaria 03/22/2019   Conversion disorder    Diabetes mellitus without complication (HCC)    Functional gait abnormality    Heart murmur    History of uterine fibroid    HSV-2 (herpes simplex virus 2) infection    Hypertension    Seasonal allergies    Sleep apnea    no cpap    Past Surgical History:  Procedure Laterality Date   BREAST CYST EXCISION Left    CYSTECTOMY     hand and breast   LAPAROSCOPIC ABDOMINAL EXPLORATION     ORIF ANKLE FRACTURE Left 10/31/2020   Procedure: OPEN TREATMENT OF LEFT LATERAL MALLEOLUS, DELTOID LIGAMENT RECONSTRUCTION AND SYNDESMOSIS;  Surgeon: Erle Crocker, MD;  Location: [facility];  Service: Orthopedics;  Laterality: Left;  LENGTH OF SURGERY: 31 MIN   TOTAL ABDOMINAL HYSTERECTOMY      Family History  Problem Relation Age of Onset   Diabetes Mother    Congestive Heart Failure Mother    Kidney failure Mother    Hypertension Mother    Asthma Mother    Prostate cancer Father    Hypertension Sister    Diabetes Maternal Grandmother    Congestive Heart Failure Maternal Grandmother    Kidney failure Maternal Grandmother    Alzheimer's disease Paternal Grandmother     Social History Social History   Tobacco Use   Smoking status: Never   Smokeless tobacco: Never  Vaping Use   Vaping Use: Never used  Substance Use Topics   Alcohol use: No   Drug use: No    Allergies  Allergen Reactions   Percocet [Oxycodone-Acetaminophen] Hives    Current Outpatient Medications   Medication Sig Dispense Refill   albuterol (VENTOLIN HFA) 108 (90 Base) MCG/ACT inhaler Inhale 2 puffs into the lungs every 4 (four) hours. 18 g 3   Ascorbic Acid (VITAMIN C PO) Take 1 tablet by mouth daily.     benzonatate (TESSALON) 200 MG capsule Take 1 capsule (200 mg total) by mouth 3 (three) times daily for 10 days 30 capsule 3   cetirizine (ZYRTEC) 10 MG tablet Take 1 tablet (10 mg total) by mouth daily. 90 tablet 3   conjugated estrogens (PREMARIN) vaginal cream Place 1 Applicatorful vaginally daily. 42.5 g 12   COVID-19 At Home Antigen Test ([facility] COVID-19 HOME TEST) KIT Use as directed 4 each 0   cyclobenzaprine (FLEXERIL) 10 MG tablet      EPINEPHrine 0.3 mg/0.3 mL IJ SOAJ injection      Ferrous Sulfate (IRON PO) Take 1 tablet by mouth daily.     fluticasone (FLONASE) 50 MCG/ACT nasal spray Place 2 sprays into both nostrils daily. 16 g 3   fluticasone-salmeterol (ADVAIR HFA) 230-21 MCG/ACT inhaler Inhale 2 puffs into the lungs 2 (two) times daily. 12 g 6   gabapentin (NEURONTIN) 300 MG capsule Take 300 mg by mouth daily.      gabapentin (NEURONTIN) 300 MG capsule Take 1 capsule (300  mg total) by mouth 3 (three) times daily. 270 capsule 3   gabapentin (NEURONTIN) 300 MG capsule Take 1 capsule (300 mg total) by mouth 3 (three) times daily. 270 capsule 3   glipiZIDE (GLUCOTROL) 10 MG tablet Take 10 mg by mouth daily.     glipiZIDE (GLUCOTROL) 10 MG tablet Take 1 tablet (10 mg total) by mouth daily. 90 tablet 3   glipiZIDE (GLUCOTROL) 10 MG tablet Take 1 tablet (10 mg total) by mouth daily. 90 tablet 3   hydrochlorothiazide (HYDRODIURIL) 12.5 MG tablet Take 12.5 mg by mouth daily.     hydrochlorothiazide (HYDRODIURIL) 12.5 MG tablet Take 1 tablet (12.5 mg total) by mouth daily. 90 tablet 3   hydrochlorothiazide (HYDRODIURIL) 12.5 MG tablet Take 1 tablet (12.5 mg total) by mouth daily. 90 tablet 3   ibuprofen (ADVIL) 800 MG tablet Take 800 mg by mouth 3 (three) times daily.      insulin degludec (TRESIBA) 100 UNIT/ML FlexTouch Pen Inject 10 Units into the skin daily.     ipratropium (ATROVENT) 0.03 % nasal spray Place 2 sprays into both nostrils 2 (two) times daily.     losartan (COZAAR) 50 MG tablet Take 50 mg by mouth daily.     losartan (COZAAR) 50 MG tablet Take 1 tablet (50 mg total) by mouth daily. 90 tablet 3   losartan (COZAAR) 50 MG tablet Take 1 tablet (50 mg total) by mouth daily. 90 tablet 3   nirmatrelvir & ritonavir (PAXLOVID, 300/100,) 20 x 150 MG & 10 x 100MG  TBPK Take 3 tablets by mouth twice a day (3 tablets total - 2 pink and 1 white tablet) for 5 days. 30 tablet 0   omalizumab (XOLAIR) 150 MG injection INJECT 300 MG SUBCUTANEOUSLY EVERY 4 WEEKS. 2 each 10   omalizumab (XOLAIR) 150 MG injection INJECT 300 MG SUBCUTANEOUSLY EVERY 4 WEEKS. 2 each 10   Omega-3 Fatty Acids (FISH OIL) 1000 MG CAPS Take by mouth.     ondansetron (ZOFRAN) 4 MG tablet Take 4 mg by mouth every 8 (eight) hours as needed for nausea or vomiting.     pravastatin (PRAVACHOL) 20 MG tablet Take 20 mg by mouth at bedtime.      rosuvastatin (CRESTOR) 20 MG tablet Take 1 tablet (20 mg total) by mouth daily. 90 tablet 3   rosuvastatin (CRESTOR) 20 MG tablet Take 1 tablet (20 mg total) by mouth daily. 90 tablet 3   sertraline (ZOLOFT) 100 MG tablet Take 1 tablet (100 mg total) by mouth daily. 90 tablet 3   sertraline (ZOLOFT) 50 MG tablet Take 50 mg by mouth at bedtime.      topiramate (TOPAMAX) 25 MG tablet      triamcinolone ointment (KENALOG) 0.1 % Apply 1 application topically daily.     valACYclovir (VALTREX) 1000 MG tablet Take 1 tablet (1,000 mg total) by mouth 3 (three) times daily. 21 tablet 0   valACYclovir (VALTREX) 1000 MG tablet Take 1 tablet (1,000 mg total) by mouth daily. 90 tablet 3   VITAMIN D PO Take 1 tablet by mouth daily.     Vitamin D, Ergocalciferol, (DRISDOL) 1.25 MG (50000 UNIT) CAPS capsule Take 1 capsule (50,000 Units total) by mouth once a week. 12 capsule 3    Current Facility-Administered Medications  Medication Dose Route Frequency Provider Last Rate Last Admin   omalizumab ) injection 300 mg  300 mg Subcutaneous Q28 days Geoffry Paradise, MD   300 mg at 10/08/21 1600  Review of Systems Review of Systems Constitutional: negative for fatigue and weight loss Respiratory: negative for cough and wheezing Cardiovascular: negative for chest pain, fatigue and palpitations Gastrointestinal: negative for abdominal pain and change in bowel habits Genitourinary: positive for vaginal bleeding and discharge Integument/breast: negative for nipple discharge Musculoskeletal:negative for myalgias Neurological: negative for gait problems and tremors Behavioral/Psych: negative for abusive relationship, depression Endocrine: negative for temperature intolerance      Blood pressure (!) 150/79, pulse 72, height $RemoveBe'4\' 9"'[id number]$  (1.448 m), weight 231 lb (104.8 kg).  Physical Exam Physical Exam General:   Alert and no distress  Skin:   no rash or abnormalities  Lungs:   clear to auscultation bilaterally  Heart:   regular rate and rhythm, S1, S2 normal, no murmur, click, rub or gallop  Breasts:   normal without suspicious masses, skin or nipple changes or axillary nodes  Abdomen:  normal findings: no organomegaly, soft, non-tender and no hernia  Pelvis:  External genitalia: normal general appearance Urinary system: urethral meatus normal and bladder without fullness, nontender Vaginal: normal without tenderness, induration or masses.   Few small clots in posterior vaginal vault Cervix: absent Adnexa: normal bimanual exam Uterus: absent    I have spent a total of 20 minutes of face-to-face time, excluding clinical staff time, reviewing notes and preparing to see patient, ordering tests and/or medications, and counseling the patient.   Data Reviewed Wet Prep  Assessment     1. Postmenopausal bleeding Rx: - US PELVIC COMPLETE WITH TRANSVAGINAL;  Future  2. Vaginal discharge Rx: - Cervicovaginal ancillary only( [place])     Plan   Follow up in 2 weeks  Orders Placed This Encounter  Procedures   US PELVIC COMPLETE WITH TRANSVAGINAL    Standing Status:   Future    Standing Expiration Date:   10/24/2022    Order Specific Question:   Reason for Exam (SYMPTOM  OR DIAGNOSIS REQUIRED)    Answer:   Vaginal bleeding    Order Specific Question:   Preferred imaging location?    Answer:   [facility]     Shelly Bombard, MD 10/24/2021 11:19 AM

## 2021-10-27 ENCOUNTER — Ambulatory Visit: Payer: 59 | Admitting: Obstetrics and Gynecology

## 2021-10-27 ENCOUNTER — Other Ambulatory Visit (HOSPITAL_COMMUNITY): Payer: Self-pay

## 2021-10-27 LAB — CERVICOVAGINAL ANCILLARY ONLY
Bacterial Vaginitis (gardnerella): NEGATIVE
Candida Glabrata: NEGATIVE
Candida Vaginitis: NEGATIVE
Comment: NEGATIVE
Comment: NEGATIVE
Comment: NEGATIVE
Comment: NEGATIVE
Trichomonas: NEGATIVE

## 2021-10-28 ENCOUNTER — Other Ambulatory Visit (HOSPITAL_COMMUNITY): Payer: Self-pay

## 2021-10-29 ENCOUNTER — Other Ambulatory Visit: Payer: Self-pay

## 2021-10-29 ENCOUNTER — Ambulatory Visit
Admission: RE | Admit: 2021-10-29 | Discharge: 2021-10-29 | Disposition: A | Discharge status: Home / Self Care | Payer: 59 | Source: Ambulatory Visit | Attending: Obstetrics | Admitting: Obstetrics

## 2021-10-29 DIAGNOSIS — Z90710 Acquired absence of both cervix and uterus: Secondary | ICD-10-CM | POA: Diagnosis not present

## 2021-10-29 DIAGNOSIS — N939 Abnormal uterine and vaginal bleeding, unspecified: Secondary | ICD-10-CM | POA: Insufficient documentation

## 2021-11-03 ENCOUNTER — Other Ambulatory Visit (HOSPITAL_COMMUNITY): Payer: Self-pay

## 2021-11-03 MED ORDER — TRULICITY 0.75 MG/0.5ML ~~LOC~~ SOAJ
0.7500 mg | SUBCUTANEOUS | 3 refills | Status: DC
  Filled 2021-11-03: qty 2, 28d supply, fill #0

## 2021-11-05 ENCOUNTER — Ambulatory Visit (INDEPENDENT_AMBULATORY_CARE_PROVIDER_SITE_OTHER): Payer: 59 | Admitting: *Deleted

## 2021-11-05 DIAGNOSIS — L501 Idiopathic urticaria: Secondary | ICD-10-CM

## 2021-11-10 ENCOUNTER — Other Ambulatory Visit: Payer: Self-pay

## 2021-11-10 ENCOUNTER — Other Ambulatory Visit (HOSPITAL_COMMUNITY): Payer: Self-pay

## 2021-11-10 ENCOUNTER — Ambulatory Visit (INDEPENDENT_AMBULATORY_CARE_PROVIDER_SITE_OTHER): Payer: 59 | Admitting: Obstetrics and Gynecology

## 2021-11-10 ENCOUNTER — Encounter: Payer: Self-pay | Admitting: Obstetrics and Gynecology

## 2021-11-10 VITALS — BP 159/86 | HR 69 | Wt 233.1 lb

## 2021-11-10 DIAGNOSIS — Z712 Person consulting for explanation of examination or test findings: Secondary | ICD-10-CM

## 2021-11-10 DIAGNOSIS — N952 Postmenopausal atrophic vaginitis: Secondary | ICD-10-CM

## 2021-11-10 MED ORDER — EPINEPHRINE 0.3 MG/0.3ML IJ SOAJ
0.3000 mg | INTRAMUSCULAR | 4 refills | Status: DC | PRN
  Filled 2021-11-10: qty 2, 2d supply, fill #0
  Filled 2022-03-18: qty 2, 2d supply, fill #1
  Filled 2022-04-17 – 2022-05-04 (×2): qty 2, 2d supply, fill #2
  Filled 2022-05-18: qty 2, 2d supply, fill #3
  Filled 2022-11-10: qty 2, 2d supply, fill #4

## 2021-11-10 MED ORDER — ESTRADIOL 0.1 MG/GM VA CREA
1.0000 | TOPICAL_CREAM | Freq: Every day | VAGINAL | 2 refills | Status: DC
  Filled 2021-11-10: qty 42.5, 11d supply, fill #0
  Filled 2021-12-09: qty 42.5, 90d supply, fill #1
  Filled 2022-03-21: qty 42.5, 90d supply, fill #2

## 2021-11-10 MED ORDER — TRIAMCINOLONE ACETONIDE 0.1 % EX OINT
1.0000 "application " | TOPICAL_OINTMENT | Freq: Every day | CUTANEOUS | 4 refills | Status: DC
  Filled 2021-11-10: qty 30, 30d supply, fill #0
  Filled 2021-12-09: qty 30, 30d supply, fill #1
  Filled 2022-03-18: qty 30, 30d supply, fill #2
  Filled 2022-04-17 – 2022-05-04 (×2): qty 30, 30d supply, fill #3
  Filled 2022-05-18 – 2022-07-07 (×2): qty 30, 30d supply, fill #4

## 2021-11-10 NOTE — Progress Notes (Signed)
51 yo postmenopausal presented to discuss ultrasound result and persistent vaginal bleeding with intercourse. Patient is without any other complaints. She reports vaginal bleeding following intercourse or when wiping. She describes the bleeding as spotting. Patient denies any pelvic pain  Past Medical History:  Diagnosis Date   Anemia    Angioedema of lips 03/22/2019   Asthma    Bell's palsy    Chronic urticaria 03/22/2019   Conversion disorder    Diabetes mellitus without complication (HCC)    Functional gait abnormality    Heart murmur    History of uterine fibroid    HSV-2 (herpes simplex virus 2) infection    Hypertension    Seasonal allergies    Sleep apnea    no cpap   Past Surgical History:  Procedure Laterality Date   BREAST CYST EXCISION Left    CYSTECTOMY     hand and breast   LAPAROSCOPIC ABDOMINAL EXPLORATION     ORIF ANKLE FRACTURE Left 10/31/2020   Procedure: OPEN TREATMENT OF LEFT LATERAL MALLEOLUS, DELTOID LIGAMENT RECONSTRUCTION AND SYNDESMOSIS;  Surgeon: Terance Hart, MD;  Location: [facility];  Service: Orthopedics;  Laterality: Left;  LENGTH OF SURGERY: 90 MIN   TOTAL ABDOMINAL HYSTERECTOMY     Family History  Problem Relation Age of Onset   Diabetes Mother    Congestive Heart Failure Mother    Kidney failure Mother    Hypertension Mother    Asthma Mother    Prostate cancer Father    Hypertension Sister    Diabetes Maternal Grandmother    Congestive Heart Failure Maternal Grandmother    Kidney failure Maternal Grandmother    Alzheimer's disease Paternal Grandmother    Social History   Tobacco Use   Smoking status: Never   Smokeless tobacco: Never  Vaping Use   Vaping Use: Never used  Substance Use Topics   Alcohol use: No   Drug use: No   ROS See pertinent in HPI. All other systems reviewed and non contributory Blood pressure (!) 159/86, pulse 69, weight 233 lb 1.6 oz (105.7 kg). GENERAL: Well-developed, well-nourished  female in no acute distress.  ABDOMEN: Soft, nontender, nondistended. No organomegaly. PELVIC: Normal external female genitalia. Vagina is pale and atrophic.  Normal discharge. No adnexal mass or tenderness. Chaperone present during the pelvic exam EXTREMITIES: No cyanosis, clubbing, or edema, 2+ distal pulses.   US PELVIC COMPLETE WITH TRANSVAGINAL  Result Date: 10/29/2021 CLINICAL DATA:  Vaginal bleeding EXAM: TRANSABDOMINAL AND TRANSVAGINAL ULTRASOUND OF PELVIS TECHNIQUE: Both transabdominal and transvaginal ultrasound examinations of the pelvis were performed. Transabdominal technique was performed for global imaging of the pelvis including uterus, ovaries, adnexal regions, and pelvic cul-de-sac. It was necessary to proceed with endovaginal exam following the transabdominal exam to visualize the ovaries. COMPARISON:  None FINDINGS: Uterus Uterus is not seen consistent with history of hysterectomy. Right ovary Not sonographically visualized. Left ovary Not sonographically visualized. Other findings There is no free fluid.  There are no dominant adnexal masses. IMPRESSION: Status post hysterectomy. Ovaries are not sonographically visualized. There are no masses or demonstrable free fluid in the pelvis. Electronically Signed   By: Ernie Avena M.D.   On: 10/29/2021 15:41   DG MOBILE BONE DENSITY  Result Date: 09/12/2021 CLINICAL DATA:  51 year old postmenopausal Black female. Personal history of LEFT ankle fracture as an adult. Currently undergoing calcium and vitamin-D supplementation. Baseline examination. EXAM: DUAL X-RAY ABSORPTIOMETRY (DXA) FOR BONE MINERAL DENSITY TECHNIQUE: Bone mineral density measurements are performed of  the spine, hip, and forearm, as appropriate, per International Society of Clinical Densitometry recommendations. The pertinent regions of interest are reported below. Non-contributory values are not reported. Images are obtained for bone mineral density measurement and  are not obtained for diagnostic purposes. FINDINGS: AP LUMBAR SPINE (L1-L4) Bone Mineral Density (BMD):  0.942 g/cm2 Young Adult T-Score:  -1.0 Z-Score:  -0.1 LEFT FEMUR NECK Bone Mineral Density (BMD):  0.751 g/cm2 Young Adult T-Score: -0.9 Z-Score:  0.0 Unit: This study was performed at [facility] on the [employer] [address] (S/N [phone]), software version 13.4.2. Scan quality: The scan quality is good. Exclusions: None. ASSESSMENT: Patient's diagnostic category is NORMAL by WHO Criteria. FRACTURE RISK: NOT INCREASED. FRAX: World Health Organization FRAX assessment of absolute fracture risk is not calculated for this patient because the patient has normal bone mineral density with all T-scores at or above -1.0. COMPARISON: None. RECOMMENDATIONS 1. All patients should optimize calcium and vitamin D intake. 2. Consider FDA-approved medical therapies in postmenopausal women and men aged 40 years and older, based on the following: - A hip or vertebral (clinical or morphometric) fracture - T-score less than or equal to -2.5 at the femoral neck or spine after appropriate evaluation to exclude secondary causes - Low bone mass (T-score between -1.0 and -2.5 at the femoral neck or spine) and a 10-year probability of a hip fracture greater than or equal to 3% or a 10-year probability of a major osteoporosis-related fracture greater than or equal to 20% based on the [place]-adapted WHO algorithm - Clinician judgment and/or patient preferences may indicate treatment for people with 10-year fracture probabilities above or below these levels 3. Patients with diagnosis of osteoporosis or at high risk for fracture should have regular bone mineral density tests. For patients eligible for Medicare, routine testing is allowed once every 2 years. The testing frequency can be increased to one year for patients who have rapidly progressing disease, those who are receiving or discontinuing medical therapy to restore bone mass, or have  additional risk factors. Electronically Signed   By: Hulan Saas M.D.   On: 09/12/2021 08:12     A/P 51 yo with postcoital bleeding likely secondary to vaginal atrophy - Discussed the benefits of estrace cream - Reviewed normal ultrasound with the patient - Instructions provided - RTC in 4 months if no improvement following consistent usage

## 2021-11-10 NOTE — Progress Notes (Signed)
Pt is still having problems with bleeding each time she wipes and with intercourse.

## 2021-11-15 ENCOUNTER — Emergency Department (HOSPITAL_BASED_OUTPATIENT_CLINIC_OR_DEPARTMENT_OTHER)
Admission: EM | Admit: 2021-11-15 | Discharge: 2021-11-15 | Disposition: A | Discharge status: Home / Self Care | Payer: 59 | Attending: Emergency Medicine | Admitting: Emergency Medicine

## 2021-11-15 ENCOUNTER — Emergency Department (HOSPITAL_BASED_OUTPATIENT_CLINIC_OR_DEPARTMENT_OTHER): Payer: 59

## 2021-11-15 ENCOUNTER — Encounter (HOSPITAL_BASED_OUTPATIENT_CLINIC_OR_DEPARTMENT_OTHER): Payer: Self-pay | Admitting: Emergency Medicine

## 2021-11-15 ENCOUNTER — Other Ambulatory Visit: Payer: Self-pay

## 2021-11-15 DIAGNOSIS — R197 Diarrhea, unspecified: Secondary | ICD-10-CM | POA: Insufficient documentation

## 2021-11-15 DIAGNOSIS — I10 Essential (primary) hypertension: Secondary | ICD-10-CM | POA: Insufficient documentation

## 2021-11-15 DIAGNOSIS — R160 Hepatomegaly, not elsewhere classified: Secondary | ICD-10-CM | POA: Diagnosis not present

## 2021-11-15 DIAGNOSIS — Z7951 Long term (current) use of inhaled steroids: Secondary | ICD-10-CM | POA: Insufficient documentation

## 2021-11-15 DIAGNOSIS — Z794 Long term (current) use of insulin: Secondary | ICD-10-CM | POA: Insufficient documentation

## 2021-11-15 DIAGNOSIS — E119 Type 2 diabetes mellitus without complications: Secondary | ICD-10-CM | POA: Insufficient documentation

## 2021-11-15 DIAGNOSIS — D649 Anemia, unspecified: Secondary | ICD-10-CM | POA: Insufficient documentation

## 2021-11-15 DIAGNOSIS — R109 Unspecified abdominal pain: Secondary | ICD-10-CM | POA: Diagnosis not present

## 2021-11-15 DIAGNOSIS — J45909 Unspecified asthma, uncomplicated: Secondary | ICD-10-CM | POA: Insufficient documentation

## 2021-11-15 DIAGNOSIS — R110 Nausea: Secondary | ICD-10-CM | POA: Diagnosis not present

## 2021-11-15 DIAGNOSIS — Z79899 Other long term (current) drug therapy: Secondary | ICD-10-CM | POA: Diagnosis not present

## 2021-11-15 DIAGNOSIS — Z7984 Long term (current) use of oral hypoglycemic drugs: Secondary | ICD-10-CM | POA: Diagnosis not present

## 2021-11-15 DIAGNOSIS — R1032 Left lower quadrant pain: Secondary | ICD-10-CM | POA: Insufficient documentation

## 2021-11-15 LAB — COMPREHENSIVE METABOLIC PANEL
ALT: 22 U/L (ref 0–44)
AST: 19 U/L (ref 15–41)
Albumin: 3.7 g/dL (ref 3.5–5.0)
Alkaline Phosphatase: 112 U/L (ref 38–126)
Anion gap: 6 (ref 5–15)
BUN: 11 mg/dL (ref 6–20)
CO2: 28 mmol/L (ref 22–32)
Calcium: 8.8 mg/dL — ABNORMAL LOW (ref 8.9–10.3)
Chloride: 106 mmol/L (ref 98–111)
Creatinine, Ser: 0.72 mg/dL (ref 0.44–1.00)
GFR, Estimated: >60 mL/min (ref >60)
Glucose, Bld: 100 mg/dL — ABNORMAL HIGH (ref 70–99)
Potassium: 3.7 mmol/L (ref 3.5–5.1)
Sodium: 140 mmol/L (ref 135–145)
Total Bilirubin: 0.7 mg/dL (ref 0.3–1.2)
Total Protein: 7.4 g/dL (ref 6.5–8.1)

## 2021-11-15 LAB — DIFFERENTIAL
Abs Immature Granulocytes: 0.01 10*3/uL (ref 0.00–0.07)
Basophils Absolute: 0 10*3/uL (ref 0.0–0.1)
Basophils Relative: 0 %
Eosinophils Absolute: 0.1 10*3/uL (ref 0.0–0.5)
Eosinophils Relative: 1 %
Immature Granulocytes: 0 %
Lymphocytes Relative: 47 %
Lymphs Abs: 2.6 10*3/uL (ref 0.7–4.0)
Monocytes Absolute: 0.3 10*3/uL (ref 0.1–1.0)
Monocytes Relative: 5 %
Neutro Abs: 2.6 10*3/uL (ref 1.7–7.7)
Neutrophils Relative %: 47 %

## 2021-11-15 LAB — CBC
HCT: 35.8 % — ABNORMAL LOW (ref 36.0–46.0)
Hemoglobin: 11.5 g/dL — ABNORMAL LOW (ref 12.0–15.0)
MCH: 23.9 pg — ABNORMAL LOW (ref 26.0–34.0)
MCHC: 32.1 g/dL (ref 30.0–36.0)
MCV: 74.3 fL — ABNORMAL LOW (ref 80.0–100.0)
Platelets: 224 10*3/uL (ref 150–400)
RBC: 4.82 MIL/uL (ref 3.87–5.11)
RDW: 16.2 % — ABNORMAL HIGH (ref 11.5–15.5)
WBC: 5.4 10*3/uL (ref 4.0–10.5)
nRBC: 0 % (ref 0.0–0.2)

## 2021-11-15 LAB — LIPASE, BLOOD: Lipase: 30 U/L (ref 11–51)

## 2021-11-15 LAB — URINALYSIS, ROUTINE W REFLEX MICROSCOPIC
Bilirubin Urine: NEGATIVE
Glucose, UA: NEGATIVE mg/dL
Ketones, ur: NEGATIVE mg/dL
Leukocytes,Ua: NEGATIVE
Nitrite: NEGATIVE
Protein, ur: NEGATIVE mg/dL
Specific Gravity, Urine: 1.02 (ref 1.005–1.030)
pH: 6 (ref 5.0–8.0)

## 2021-11-15 LAB — URINALYSIS, MICROSCOPIC (REFLEX)

## 2021-11-15 MED ORDER — ONDANSETRON HCL 4 MG/2ML IJ SOLN
4.0000 mg | Freq: Once | INTRAMUSCULAR | Status: AC
  Administered 2021-11-15: 4 mg via INTRAVENOUS
  Filled 2021-11-15: qty 2

## 2021-11-15 MED ORDER — HYDROCODONE-ACETAMINOPHEN 5-325 MG PO TABS: 1.0000 | ORAL_TABLET | Freq: Four times a day (QID) | ORAL | 0 refills | Status: DC | PRN

## 2021-11-15 MED ORDER — IOHEXOL 300 MG/ML  SOLN
100.0000 mL | Freq: Once | INTRAMUSCULAR | Status: AC | PRN
  Administered 2021-11-15: 100 mL via INTRAVENOUS

## 2021-11-15 MED ORDER — FENTANYL CITRATE PF 50 MCG/ML IJ SOSY
50.0000 ug | PREFILLED_SYRINGE | Freq: Once | INTRAMUSCULAR | Status: AC
  Administered 2021-11-15: 50 ug via INTRAVENOUS
  Filled 2021-11-15: qty 1

## 2021-11-15 MED ORDER — LACTATED RINGERS IV BOLUS
1000.0000 mL | Freq: Once | INTRAVENOUS | Status: AC
  Administered 2021-11-15: 1000 mL via INTRAVENOUS

## 2021-11-15 MED ORDER — FENTANYL CITRATE PF 50 MCG/ML IJ SOSY
50.0000 ug | PREFILLED_SYRINGE | Freq: Once | INTRAMUSCULAR | Status: DC
  Filled 2021-11-15: qty 1

## 2021-11-15 NOTE — ED Provider Notes (Signed)
[place] EMERGENCY DEPARTMENT  Provider Note  CSN: [id number] Arrival date & time: 11/15/21 1036  History Chief Complaint  Patient presents with   Abdominal Pain    Shelley Mccoy is a 51 y.o. female with history of DM reports she has had 3 days of LLQ abdominal pain, constant, sharp pain. Not associated with vomiting but some nausea. She has chronic diarrhea unchanged. No fever or dysuria.    Home Medications Prior to Admission medications   Medication Sig Start Date End Date Taking? Authorizing Provider  HYDROcodone-acetaminophen (NORCO/VICODIN) 5-325 MG tablet Take 1 tablet by mouth every 6 (six) hours as needed for severe pain. 11/15/21  Yes Truddie Hidden, MD  albuterol (VENTOLIN HFA) 108 (90 Base) MCG/ACT inhaler Inhale 2 puffs into the lungs every 4 (four) hours. 08/06/21     Ascorbic Acid (VITAMIN C PO) Take 1 tablet by mouth daily.    [provider]  benzonatate (TESSALON) 200 MG capsule Take 1 capsule (200 mg total) by mouth 3 (three) times daily for 10 days 09/24/21     cetirizine (ZYRTEC) 10 MG tablet Take 1 tablet (10 mg total) by mouth daily. 08/06/21     COVID-19 At Home Antigen Test [facility] COVID-19 HOME TEST) KIT Use as directed 09/09/21   Jefm Bryant, RPH  cyclobenzaprine (FLEXERIL) 10 MG tablet  10/19/20   [provider]  Dulaglutide (TRULICITY) 6.23 JS/2.8BT SOPN Inject 0.75 mg into the skin every 7 (seven) days. 11/03/21     EPINEPHrine 0.3 mg/0.3 mL IJ SOAJ injection  04/19/19   [provider]  EPINEPHrine 0.3 mg/0.3 mL IJ SOAJ injection Inject 0.3 mg into the muscle as needed. 11/10/21     estradiol (ESTRACE VAGINAL) 0.1 MG/GM vaginal cream Place 1 Applicatorful (4grams) vaginally daily for 2 weeks, then decrease to 2 grams vaginally daily for 2 weeks, then 1 gram vaginally 2 to 3 times weekly. 11/10/21   Constant, Peggy, MD  Ferrous Sulfate (IRON PO) Take 1 tablet by mouth daily.    [provider]   fluticasone (FLONASE) 50 MCG/ACT nasal spray Place 2 sprays into both nostrils daily. 08/06/21     fluticasone-salmeterol (ADVAIR HFA) 230-21 MCG/ACT inhaler Inhale 2 puffs into the lungs 2 (two) times daily. 08/06/21     gabapentin (NEURONTIN) 300 MG capsule Take 300 mg by mouth daily.     [provider]  gabapentin (NEURONTIN) 300 MG capsule Take 1 capsule (300 mg total) by mouth 3 (three) times daily. 04/03/21     gabapentin (NEURONTIN) 300 MG capsule Take 1 capsule (300 mg total) by mouth 3 (three) times daily. 08/06/21     glipiZIDE (GLUCOTROL) 10 MG tablet Take 10 mg by mouth daily. 07/26/20   [provider]  glipiZIDE (GLUCOTROL) 10 MG tablet Take 1 tablet (10 mg total) by mouth daily. 04/03/21     glipiZIDE (GLUCOTROL) 10 MG tablet Take 1 tablet (10 mg total) by mouth daily. 08/06/21     hydrochlorothiazide (HYDRODIURIL) 12.5 MG tablet Take 12.5 mg by mouth daily.    [provider]  hydrochlorothiazide (HYDRODIURIL) 12.5 MG tablet Take 1 tablet (12.5 mg total) by mouth daily. 04/03/21     hydrochlorothiazide (HYDRODIURIL) 12.5 MG tablet Take 1 tablet (12.5 mg total) by mouth daily. 08/06/21     ibuprofen (ADVIL) 800 MG tablet Take 800 mg by mouth 3 (three) times daily. 10/19/20   [provider]  insulin degludec (TRESIBA) 100 UNIT/ML FlexTouch Pen Inject 10  Units into the skin daily.    [provider]  ipratropium (ATROVENT) 0.03 % nasal spray Place 2 sprays into both nostrils 2 (two) times daily. 12/15/18   [provider]  losartan (COZAAR) 50 MG tablet Take 50 mg by mouth daily.    [provider]  losartan (COZAAR) 50 MG tablet Take 1 tablet (50 mg total) by mouth daily. 04/03/21     losartan (COZAAR) 50 MG tablet Take 1 tablet (50 mg total) by mouth daily. 08/06/21     nirmatrelvir & ritonavir (PAXLOVID, 300/100,) 20 x 150 MG & 10 x $Re'100MG'Zpn$  TBPK Take 3 tablets by mouth twice a day (3 tablets total - 2 pink and 1 white tablet) for 5  days. 09/17/21   Garnet Sierras, DO  omalizumab Arvid Right) 150 MG injection INJECT 300 MG SUBCUTANEOUSLY EVERY 4 WEEKS. 08/09/20   Tresa Garter, MD  omalizumab (XOLAIR) 150 MG injection INJECT 300 MG SUBCUTANEOUSLY EVERY 4 WEEKS. 09/03/21 09/03/22  Tresa Garter, MD  Omega-3 Fatty Acids (FISH OIL) 1000 MG CAPS Take by mouth.    [provider]  ondansetron (ZOFRAN) 4 MG tablet Take 4 mg by mouth every 8 (eight) hours as needed for nausea or vomiting.    [provider]  pravastatin (PRAVACHOL) 20 MG tablet Take 20 mg by mouth at bedtime.     [provider]  rosuvastatin (CRESTOR) 20 MG tablet Take 1 tablet (20 mg total) by mouth daily. 04/03/21     rosuvastatin (CRESTOR) 20 MG tablet Take 1 tablet (20 mg total) by mouth daily. 08/06/21     sertraline (ZOLOFT) 100 MG tablet Take 1 tablet (100 mg total) by mouth daily. 08/06/21     sertraline (ZOLOFT) 50 MG tablet Take 50 mg by mouth at bedtime.     [provider]  topiramate (TOPAMAX) 25 MG tablet  07/26/20   [provider]  triamcinolone ointment (KENALOG) 0.1 % Apply 1 application topically daily. 07/26/20   [provider]  triamcinolone ointment (KENALOG) 0.1 % Apply 1 gm to affected area daily. 11/10/21     valACYclovir (VALTREX) 1000 MG tablet Take 1 tablet (1,000 mg total) by mouth 3 (three) times daily. 12/18/18   Carmin Muskrat, MD  valACYclovir (VALTREX) 1000 MG tablet Take 1 tablet (1,000 mg total) by mouth daily. 04/03/21     VITAMIN D PO Take 1 tablet by mouth daily.    [provider]  Vitamin D, Ergocalciferol, (DRISDOL) 1.25 MG (50000 UNIT) CAPS capsule Take 1 capsule (50,000 Units total) by mouth once a week. 04/08/21        Allergies    Percocet [oxycodone-acetaminophen]   Review of Systems   Review of Systems Please see HPI for pertinent positives and negatives  Physical Exam BP 136/84    Pulse 65    Temp 97.8 F (36.6 C) (Oral)    Resp 20    Ht 5'  (1.524 m)    Wt 105.7 kg    SpO2 100%    BMI 45.50 kg/m   Physical Exam Vitals and nursing note reviewed.  Constitutional:      Appearance: Normal appearance.  HENT:     Head: Normocephalic and atraumatic.     Nose: Nose normal.     Mouth/Throat:     Mouth: Mucous membranes are moist.  Eyes:     Extraocular Movements: Extraocular movements intact.     Conjunctiva/sclera: Conjunctivae normal.  Cardiovascular:     Rate  and Rhythm: Normal rate.  Pulmonary:     Effort: Pulmonary effort is normal.     Breath sounds: Normal breath sounds.  Abdominal:     General: Abdomen is flat.     Palpations: Abdomen is soft.     Tenderness: There is abdominal tenderness in the left lower quadrant. There is no guarding. Negative signs include Murphy's sign and McBurney's sign.  Musculoskeletal:        General: No swelling. Normal range of motion.     Cervical back: Neck supple.  Skin:    General: Skin is warm and dry.  Neurological:     General: No focal deficit present.     Mental Status: She is alert.  Psychiatric:        Mood and Affect: Mood normal.    ED Results / Procedures / Treatments   EKG None  Procedures Procedures  Medications Ordered in the ED Medications  fentaNYL (SUBLIMAZE) injection 50 mcg (has no administration in time range)  ondansetron (ZOFRAN) injection 4 mg (4 mg Intravenous Given 11/15/21 1415)  fentaNYL (SUBLIMAZE) injection 50 mcg (50 mcg Intravenous Given 11/15/21 1415)  lactated ringers bolus 1,000 mL (1,000 mLs Intravenous New Bag/Given 11/15/21 1423)  iohexol (OMNIPAQUE) 300 MG/ML solution 100 mL (100 mLs Intravenous Contrast Given 11/15/21 1502)    Initial Impression and Plan  Patient with LLQ abdominal pain, no history of same. Will check labs, CT. Most likely this is diverticulitis but she has no history of same. She is s/p total hysterectomy/oophorectomy so no pelvic organs to be concerned about. Pain/nausea meds for comfort.   ED Course   Clinical  Course as of 11/15/21 1538  Sat Nov 15, 2021  1410 UA is neg for infection. There is some blood, pain could be kidney stone although it does not seem colicky in nature.  [CS]  1610 CBC with mild anemia at baseline, WBC is normal.  [CS]  1504 CMP and lipase negative.  [CS]  1532 I personally viewed the images from radiology studies and agree with radiologist interpretation: No acute process.  Patient still having some pain but does not appear to be an acute surgical process or other issue requiring admission. Given presenting complaint, I considered that admission might be necessary. After review of results from ED lab and/or imaging studies, admission to the hospital is not indicated at this time. Plan discharge with Rx for pain meds for comfort and PCP follow up.   [CS]    Clinical Course User Index [CS] Truddie Hidden, MD     MDM Rules/Calculators/A&P Medical Decision Making Problems Addressed: Left lower quadrant abdominal pain: acute illness or injury with systemic symptoms  Amount and/or Complexity of Data Reviewed Labs: ordered. Decision-making details documented in ED Course. Radiology: ordered and independent interpretation performed. Decision-making details documented in ED Course.  Risk Prescription drug management. Decision regarding hospitalization.    Final Clinical Impression(s) / ED Diagnoses Final diagnoses:  Left lower quadrant abdominal pain    Rx / DC Orders ED Discharge Orders          Ordered    HYDROcodone-acetaminophen (NORCO/VICODIN) 5-325 MG tablet  Every 6 hours PRN        11/15/21 1538             Truddie Hidden, MD 11/15/21 1538

## 2021-11-15 NOTE — ED Notes (Signed)
Pt states she is driving and cannot get a ride home.  Informed her that it is not safe for her to drive after fentanyl 6237.  She is going to call to see if someone can pick her up.

## 2021-11-15 NOTE — ED Notes (Signed)
Pt states she has a ride home but doesn't want anymore pain medication

## 2021-11-15 NOTE — ED Triage Notes (Signed)
Pt c/o LLQ pain since Thur pm, + nausea

## 2021-11-19 ENCOUNTER — Other Ambulatory Visit (HOSPITAL_COMMUNITY): Payer: Self-pay

## 2021-11-19 DIAGNOSIS — N939 Abnormal uterine and vaginal bleeding, unspecified: Secondary | ICD-10-CM | POA: Diagnosis not present

## 2021-11-19 DIAGNOSIS — R102 Pelvic and perineal pain: Secondary | ICD-10-CM | POA: Diagnosis not present

## 2021-11-19 DIAGNOSIS — R319 Hematuria, unspecified: Secondary | ICD-10-CM | POA: Diagnosis not present

## 2021-11-19 DIAGNOSIS — Z6841 Body Mass Index (BMI) 40.0 and over, adult: Secondary | ICD-10-CM | POA: Diagnosis not present

## 2021-11-19 MED ORDER — METRONIDAZOLE 500 MG PO TABS
500.0000 mg | ORAL_TABLET | Freq: Two times a day (BID) | ORAL | 0 refills | Status: DC
  Filled 2021-11-19: qty 14, 7d supply, fill #0

## 2021-11-19 MED ORDER — FLUCONAZOLE 150 MG PO TABS
150.0000 mg | ORAL_TABLET | Freq: Every day | ORAL | 1 refills | Status: DC
  Filled 2021-11-19: qty 2, 2d supply, fill #0
  Filled 2021-12-09: qty 2, 2d supply, fill #1

## 2021-11-21 ENCOUNTER — Other Ambulatory Visit (HOSPITAL_COMMUNITY): Payer: Self-pay

## 2021-11-24 ENCOUNTER — Other Ambulatory Visit (HOSPITAL_COMMUNITY): Payer: Self-pay

## 2021-11-26 ENCOUNTER — Other Ambulatory Visit (HOSPITAL_COMMUNITY): Payer: Self-pay

## 2021-11-26 ENCOUNTER — Other Ambulatory Visit: Payer: Self-pay

## 2021-11-27 ENCOUNTER — Other Ambulatory Visit: Payer: Self-pay

## 2021-11-27 ENCOUNTER — Other Ambulatory Visit (HOSPITAL_COMMUNITY): Payer: Self-pay

## 2021-11-27 DIAGNOSIS — R319 Hematuria, unspecified: Secondary | ICD-10-CM

## 2021-12-03 ENCOUNTER — Ambulatory Visit (INDEPENDENT_AMBULATORY_CARE_PROVIDER_SITE_OTHER): Payer: 59

## 2021-12-03 ENCOUNTER — Other Ambulatory Visit: Payer: Self-pay

## 2021-12-03 DIAGNOSIS — L501 Idiopathic urticaria: Secondary | ICD-10-CM

## 2021-12-08 ENCOUNTER — Ambulatory Visit: Payer: 59 | Admitting: Obstetrics and Gynecology

## 2021-12-08 ENCOUNTER — Encounter (HOSPITAL_COMMUNITY): Payer: Self-pay

## 2021-12-08 ENCOUNTER — Ambulatory Visit (HOSPITAL_COMMUNITY): Admission: RE | Admit: 2021-12-08 | Payer: 59 | Source: Ambulatory Visit

## 2021-12-09 ENCOUNTER — Other Ambulatory Visit (HOSPITAL_COMMUNITY): Payer: Self-pay

## 2021-12-10 ENCOUNTER — Other Ambulatory Visit (HOSPITAL_COMMUNITY): Payer: Self-pay

## 2021-12-10 IMAGING — DX DG ANKLE PORT 2V*L*
1 series · 1 of 1 positions shown · non-contrast
Comparison: None.

CLINICAL DATA: Status post reduction of left ankle dislocation

EXAM:
PORTABLE LEFT ANKLE - 2 VIEW

[ankle ap]
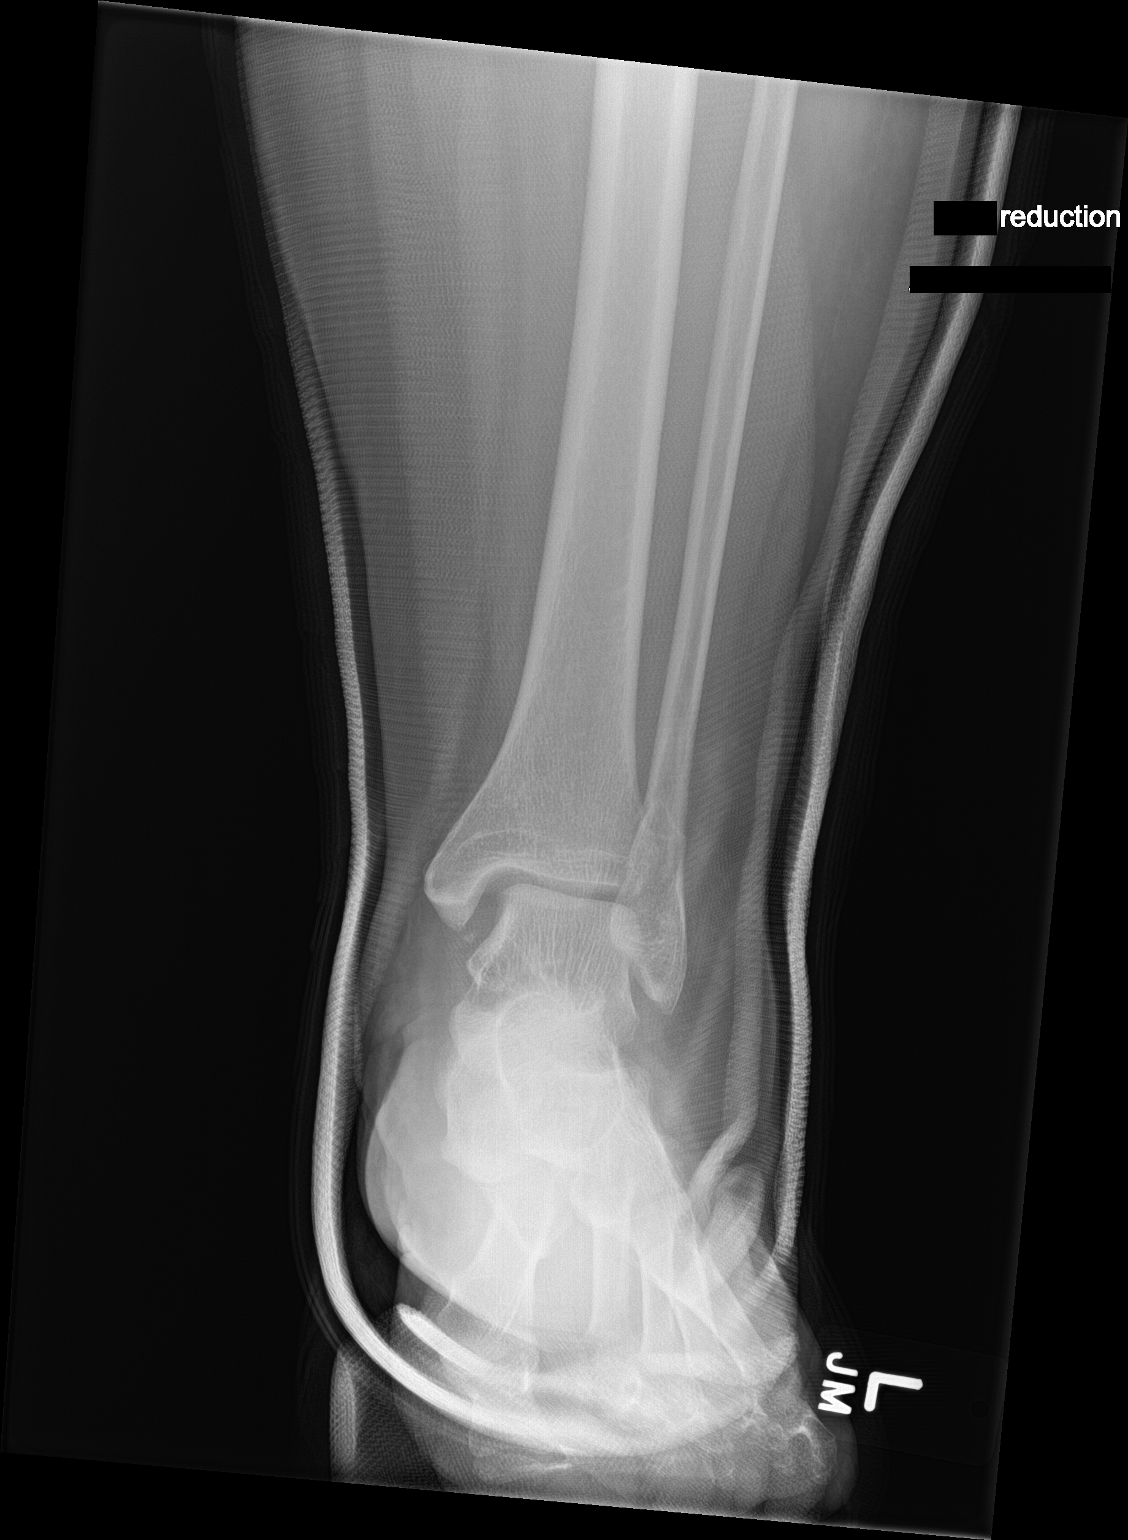

[1 of 1 positions shown; findings below may reference images not displayed]

FINDINGS: Oblique fracture of the distal fibular metaphysis. Avulsion fracture
of the distal tip of the medial malleolus. Persistent slight
widening of the joint space between the medial malleolus and talus.
No frank ankle dislocation on single AP view. No other fracture or
dislocation.
IMPRESSION: Oblique fracture of the distal fibular metaphysis. Avulsion fracture
of the distal tip of the medial malleolus. Persistent slight
widening of the joint space between the medial malleolus and talus.

## 2021-12-11 ENCOUNTER — Other Ambulatory Visit (HOSPITAL_COMMUNITY): Payer: Self-pay

## 2021-12-11 MED ORDER — CARESTART COVID-19 HOME TEST VI KIT
PACK | 0 refills | Status: DC
  Filled 2021-12-11: qty 4, 4d supply, fill #0

## 2021-12-17 ENCOUNTER — Other Ambulatory Visit: Payer: Self-pay

## 2021-12-17 ENCOUNTER — Ambulatory Visit (HOSPITAL_COMMUNITY)
Admission: RE | Admit: 2021-12-17 | Discharge: 2021-12-17 | Disposition: A | Discharge status: Home / Self Care | Payer: 59 | Source: Ambulatory Visit

## 2021-12-17 DIAGNOSIS — R319 Hematuria, unspecified: Secondary | ICD-10-CM | POA: Insufficient documentation

## 2021-12-19 ENCOUNTER — Other Ambulatory Visit (HOSPITAL_COMMUNITY): Payer: Self-pay

## 2021-12-20 IMAGING — RF DG C-ARM 1-60 MIN
1 series · 6 of 6 positions shown · non-contrast
Comparison: 10/21/2020

CLINICAL DATA: Surgery

EXAM:
DG C-ARM 1-60 MIN; LEFT ANKLE COMPLETE - 3+ VIEW
FLUOROSCOPY TIME:  Fluoroscopy Time:  13.5 seconds

[Series 1: run · 6 of 6 slices shown]
[im 1/6]
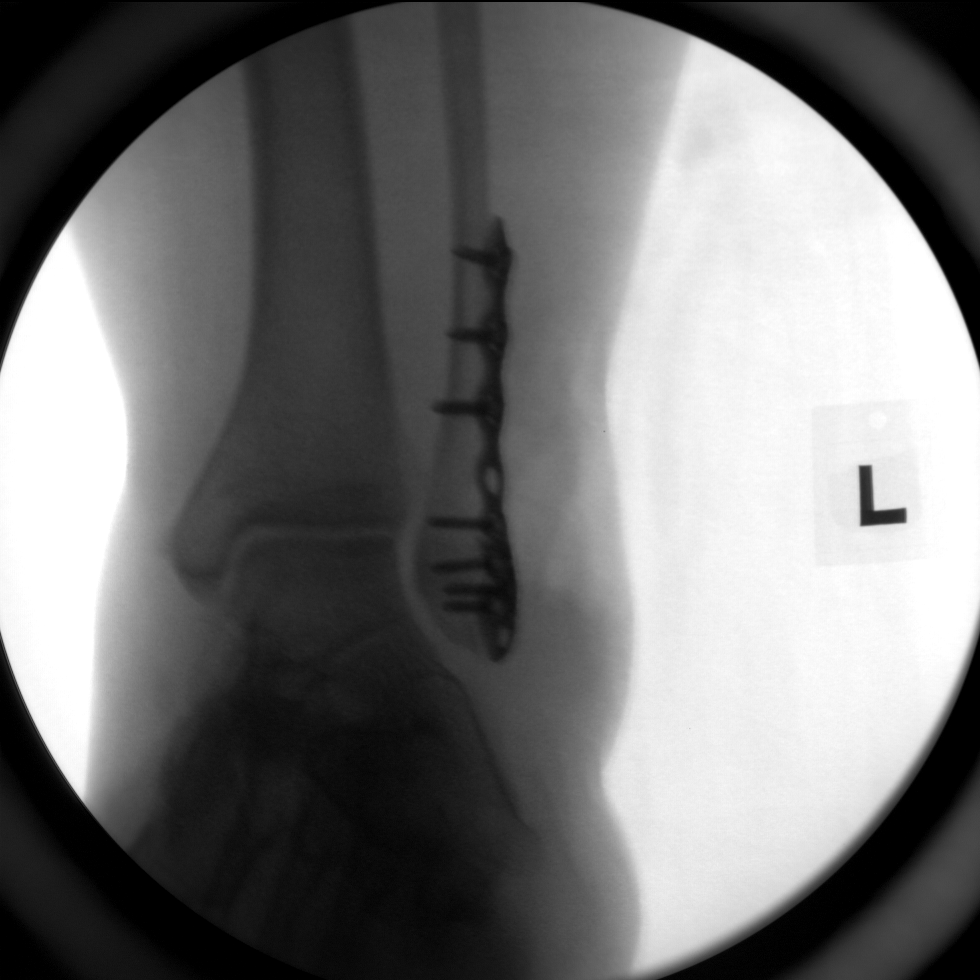
[im 2/6]
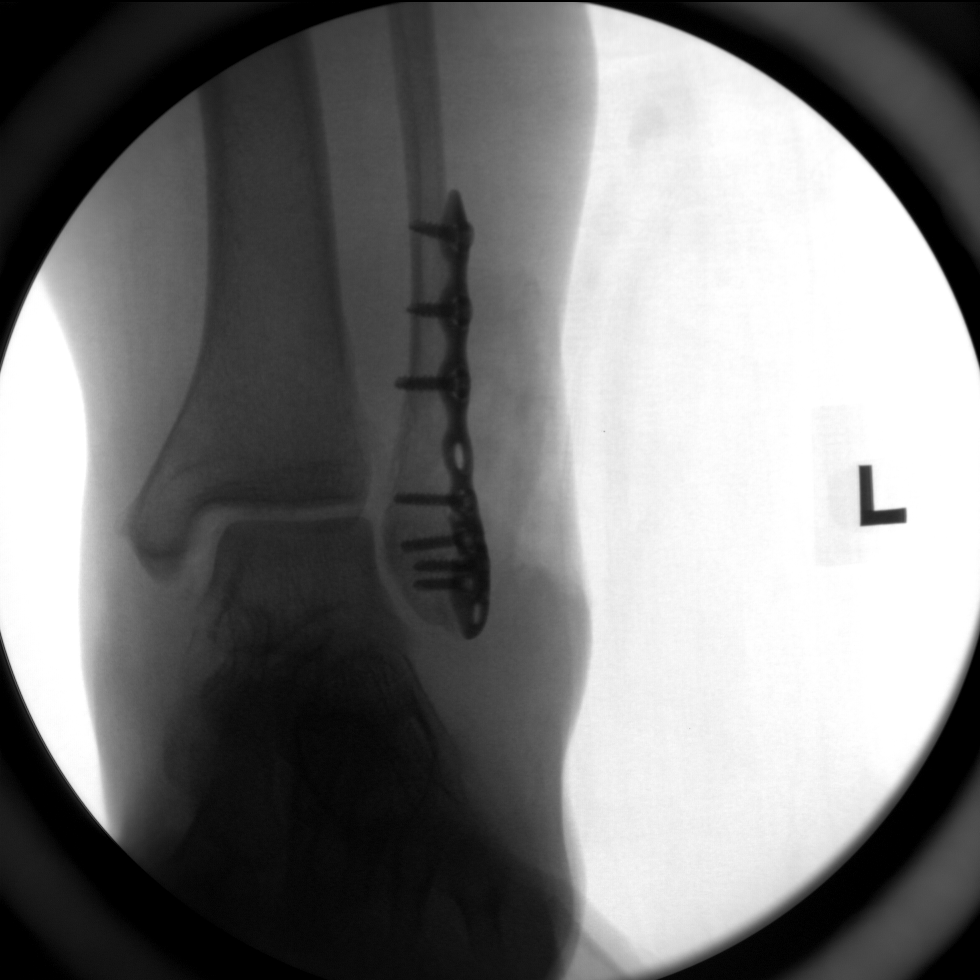
[im 3/6]
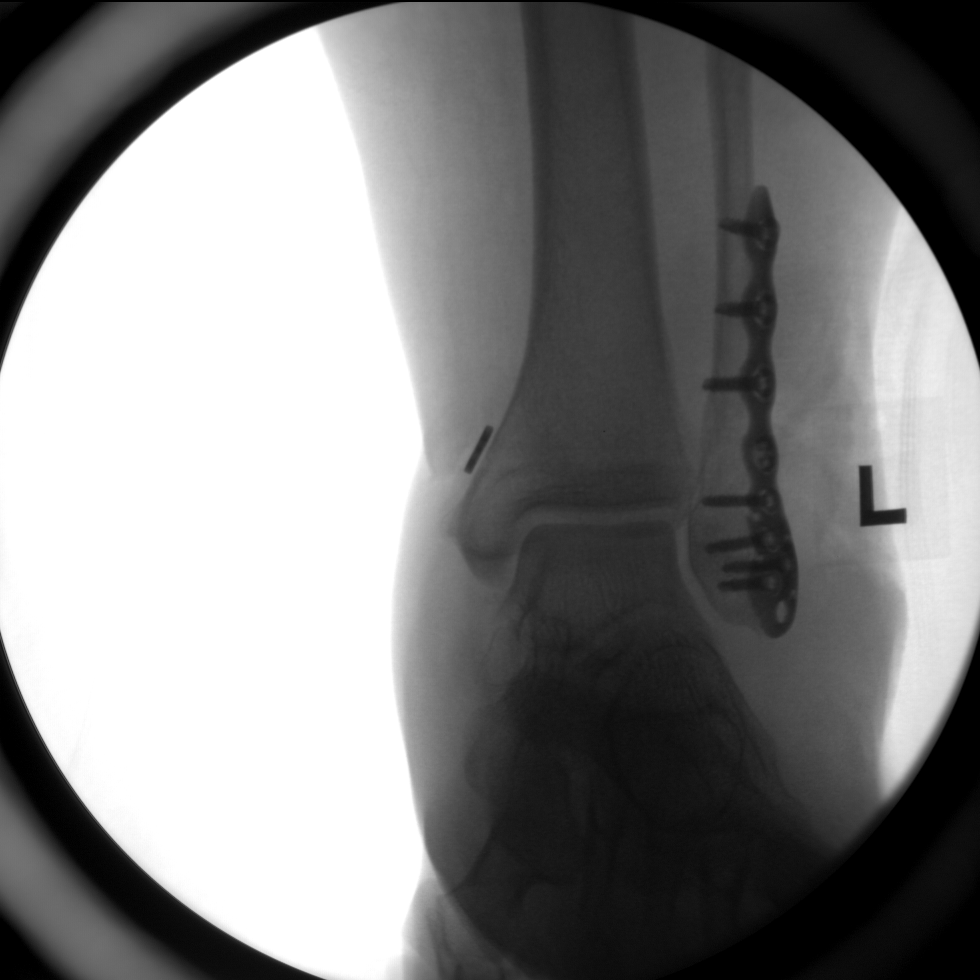
[im 4/6]
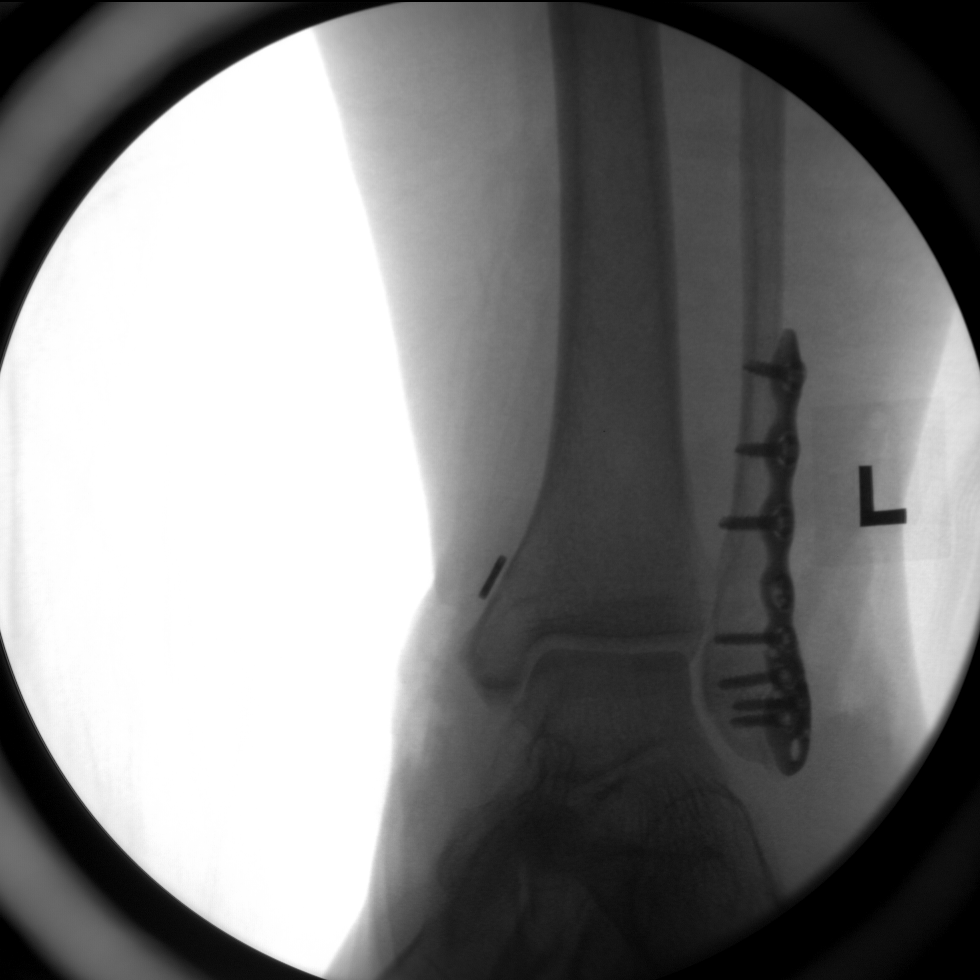
[im 5/6]
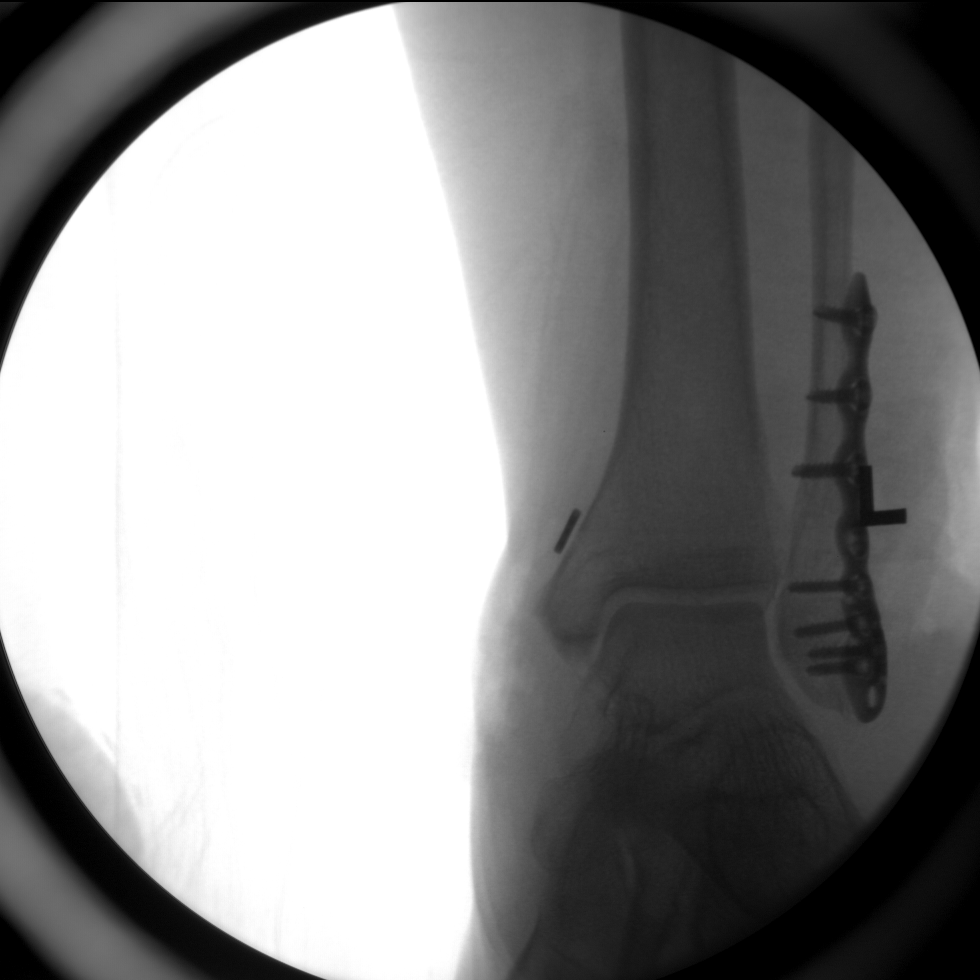
[im 6/6]
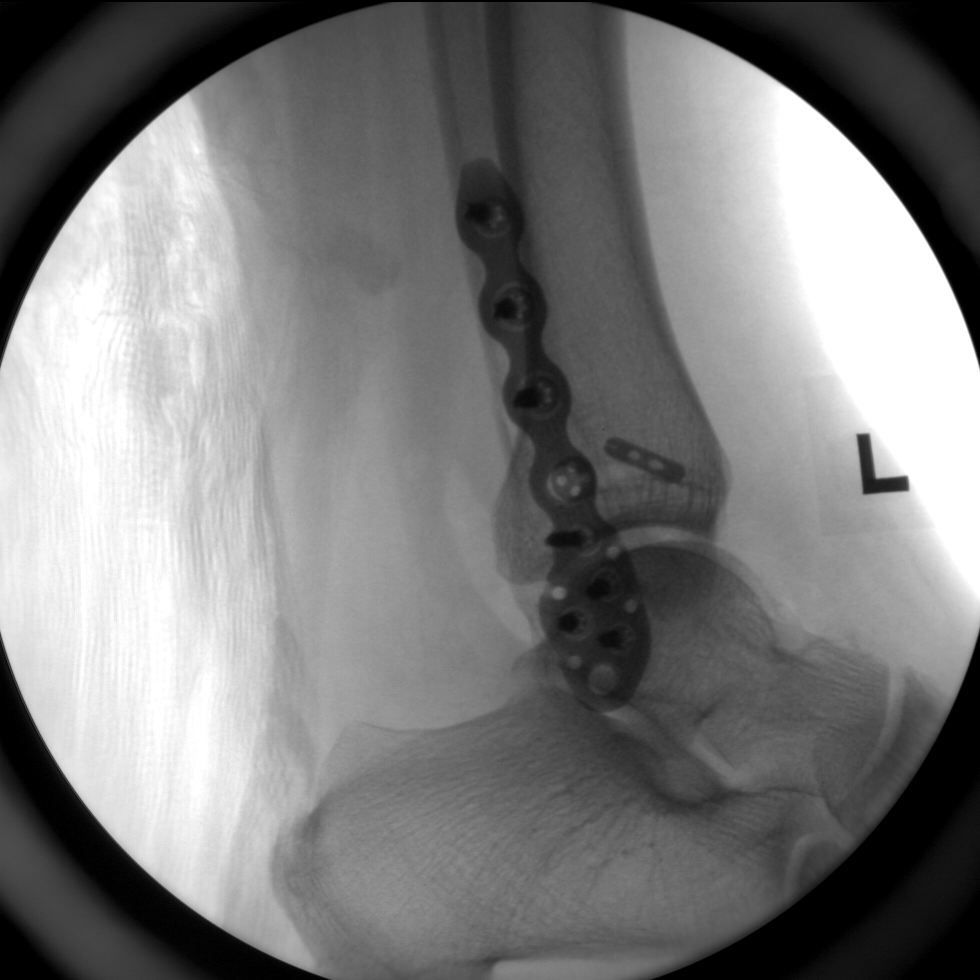

[6 of 6 positions shown; findings below may reference images not displayed]

FINDINGS: Six C-arm fluoroscopic images were obtained intraoperatively and
submitted for post operative interpretation. These images
demonstrate surgical changes of plate and screw fixation of the
distal fibula with repair of the syndesmosis. Final images
demonstrate anatomic alignment of the ankle mortise. No unexpected
findings. Please see the performing provider's procedural report for
further detail.
IMPRESSION: Intraoperative fluoroscopic imaging, as detailed above.

## 2021-12-20 IMAGING — RF DG ANKLE COMPLETE 3+V*L*
1 series · 6 of 6 positions shown · non-contrast
Comparison: 10/21/2020

CLINICAL DATA: Surgery

EXAM:
DG C-ARM 1-60 MIN; LEFT ANKLE COMPLETE - 3+ VIEW
FLUOROSCOPY TIME:  Fluoroscopy Time:  13.5 seconds

[Series 1: run · 6 of 6 slices shown]
[im 1/6]
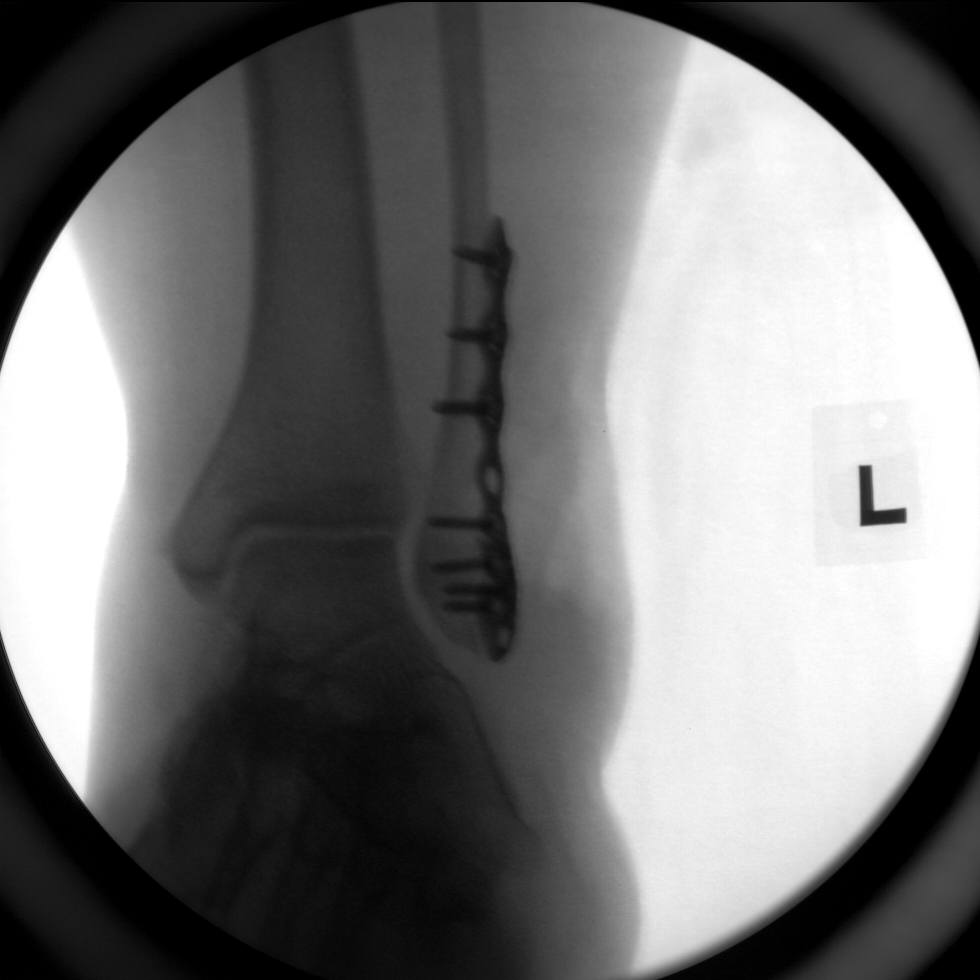
[im 2/6]
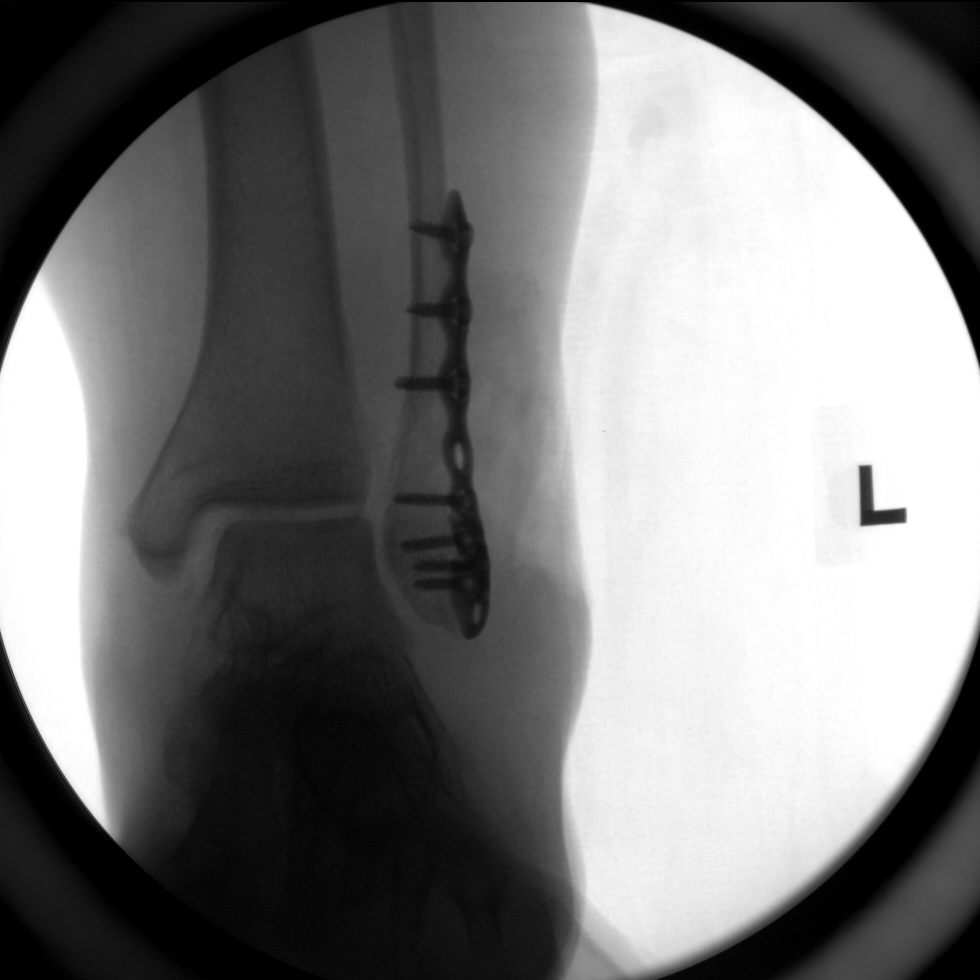
[im 3/6]
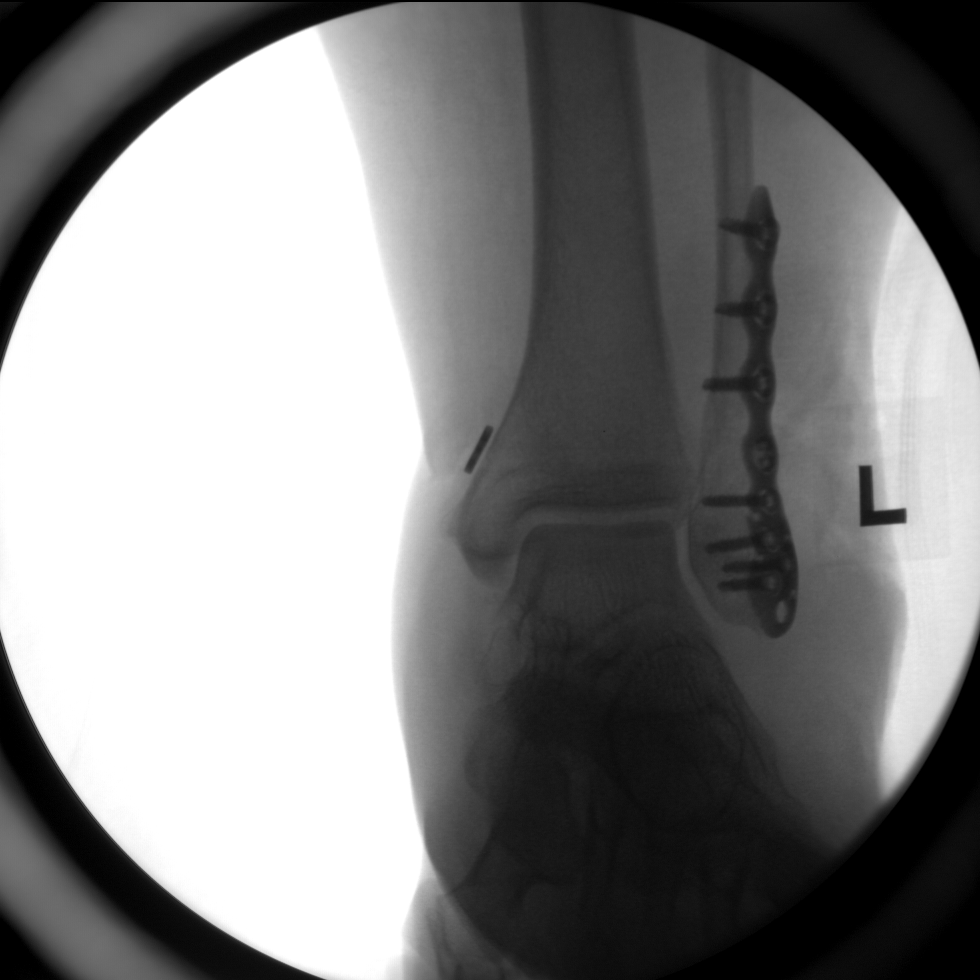
[im 4/6]
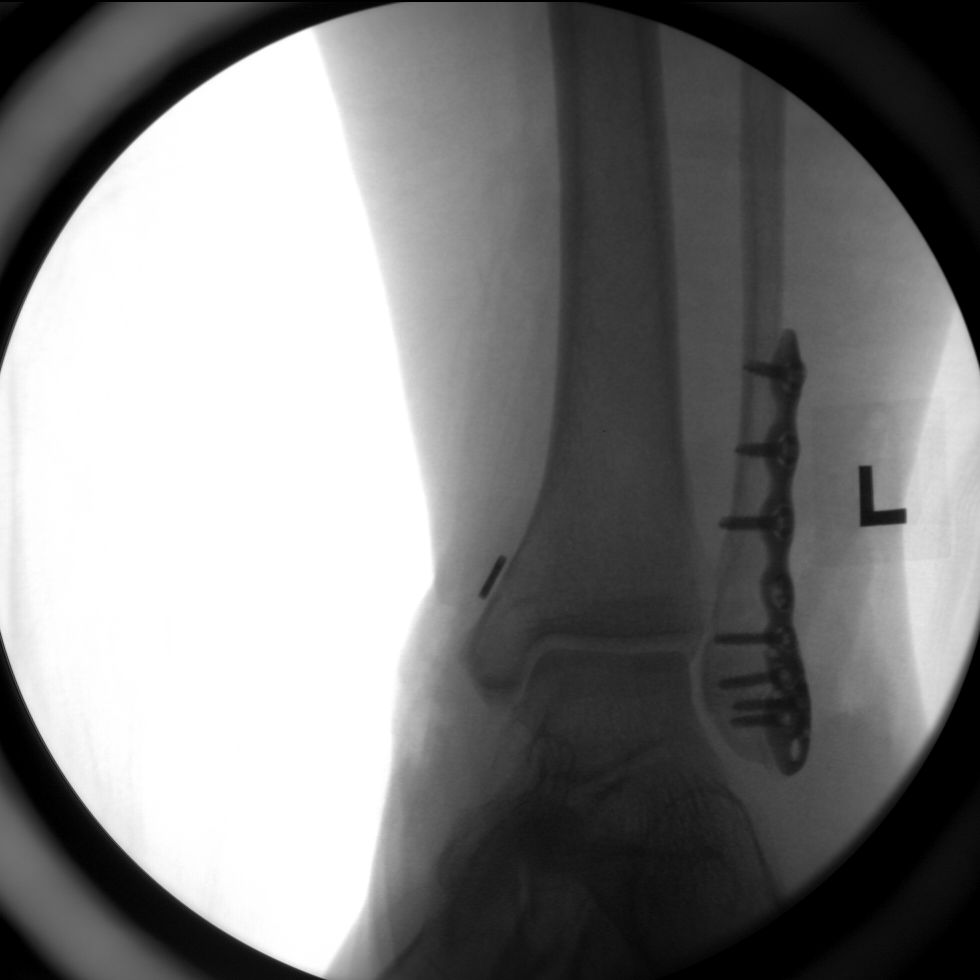
[im 5/6]
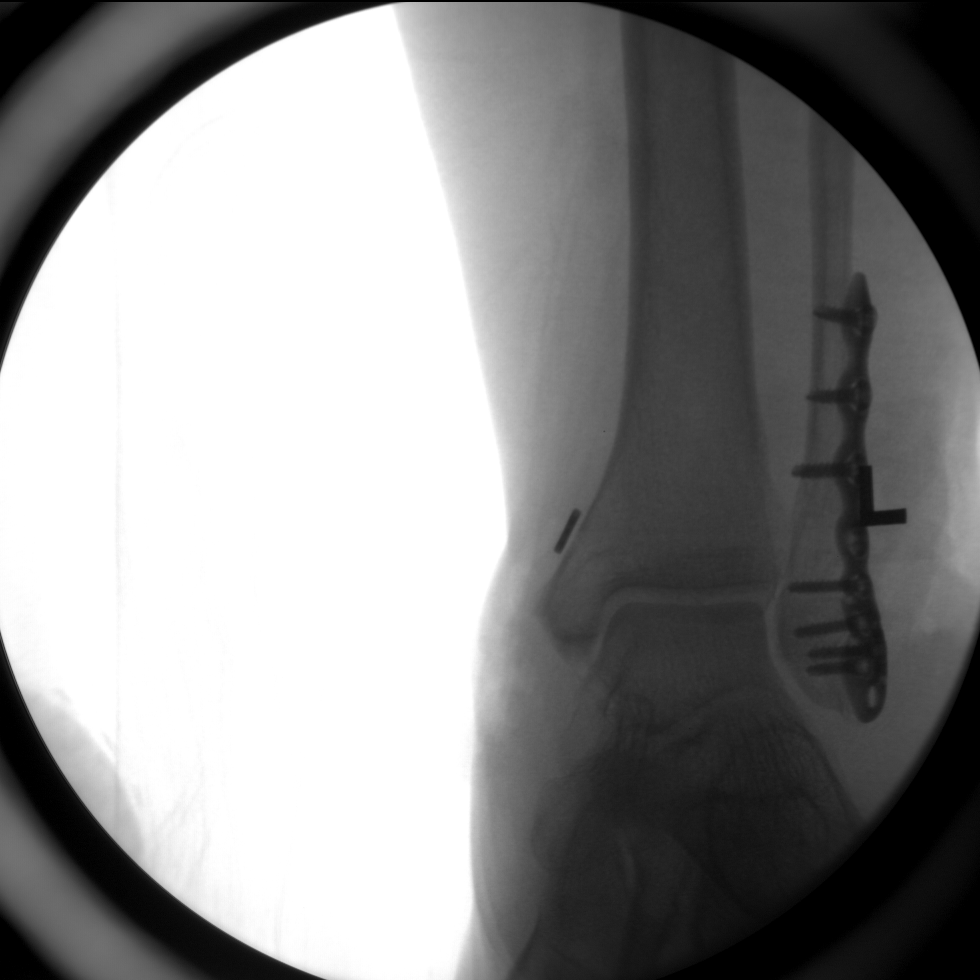
[im 6/6]
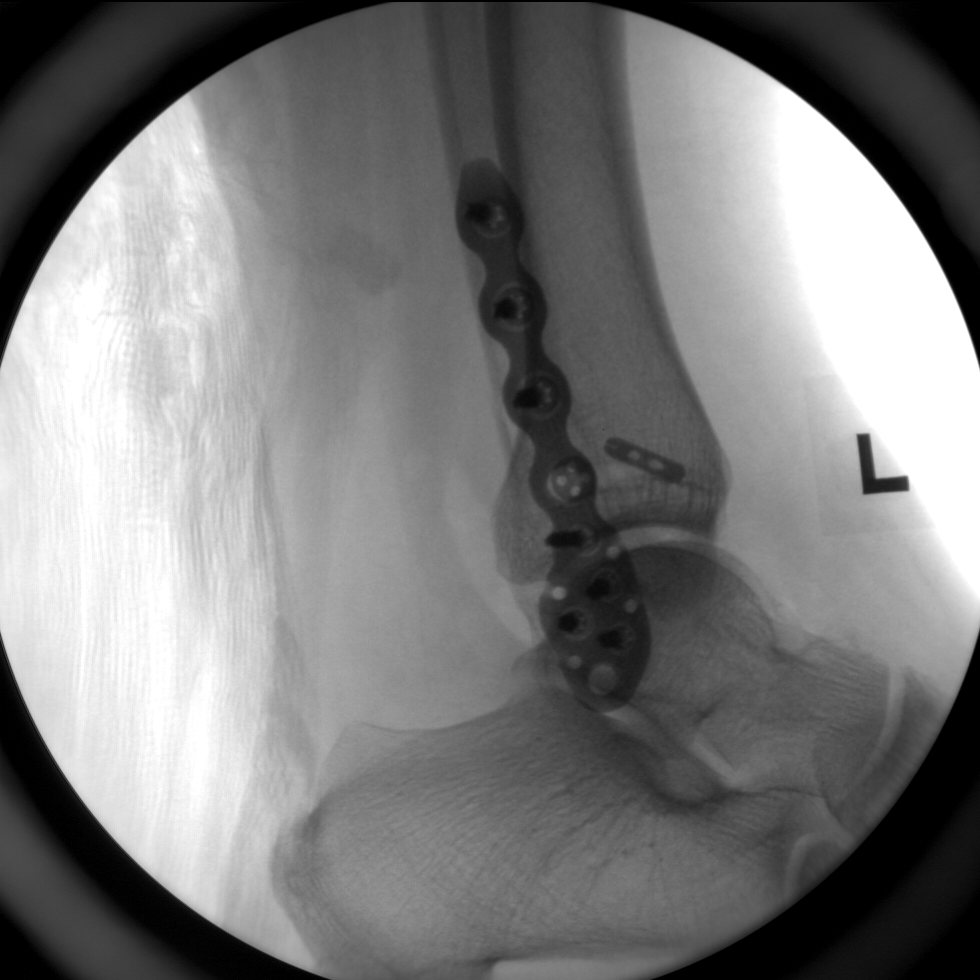

[6 of 6 positions shown; findings below may reference images not displayed]

FINDINGS: Six C-arm fluoroscopic images were obtained intraoperatively and
submitted for post operative interpretation. These images
demonstrate surgical changes of plate and screw fixation of the
distal fibula with repair of the syndesmosis. Final images
demonstrate anatomic alignment of the ankle mortise. No unexpected
findings. Please see the performing provider's procedural report for
further detail.
IMPRESSION: Intraoperative fluoroscopic imaging, as detailed above.

## 2021-12-22 ENCOUNTER — Other Ambulatory Visit (HOSPITAL_COMMUNITY): Payer: Self-pay

## 2021-12-26 ENCOUNTER — Other Ambulatory Visit (HOSPITAL_COMMUNITY): Payer: Self-pay

## 2021-12-29 ENCOUNTER — Other Ambulatory Visit (HOSPITAL_COMMUNITY): Payer: Self-pay

## 2021-12-31 ENCOUNTER — Other Ambulatory Visit (HOSPITAL_COMMUNITY): Payer: Self-pay

## 2021-12-31 DIAGNOSIS — F329 Major depressive disorder, single episode, unspecified: Secondary | ICD-10-CM | POA: Diagnosis not present

## 2021-12-31 DIAGNOSIS — E559 Vitamin D deficiency, unspecified: Secondary | ICD-10-CM | POA: Diagnosis not present

## 2021-12-31 DIAGNOSIS — E119 Type 2 diabetes mellitus without complications: Secondary | ICD-10-CM | POA: Diagnosis not present

## 2021-12-31 DIAGNOSIS — J4520 Mild intermittent asthma, uncomplicated: Secondary | ICD-10-CM | POA: Diagnosis not present

## 2021-12-31 DIAGNOSIS — E785 Hyperlipidemia, unspecified: Secondary | ICD-10-CM | POA: Diagnosis not present

## 2021-12-31 DIAGNOSIS — B009 Herpesviral infection, unspecified: Secondary | ICD-10-CM | POA: Diagnosis not present

## 2021-12-31 DIAGNOSIS — S3982XA Other specified injuries of lower back, initial encounter: Secondary | ICD-10-CM | POA: Diagnosis not present

## 2021-12-31 DIAGNOSIS — I10 Essential (primary) hypertension: Secondary | ICD-10-CM | POA: Diagnosis not present

## 2021-12-31 MED ORDER — VITAMIN D (ERGOCALCIFEROL) 1.25 MG (50000 UNIT) PO CAPS
50000.0000 [IU] | ORAL_CAPSULE | ORAL | 3 refills | Status: DC
  Filled 2021-12-31: qty 12, 84d supply, fill #0
  Filled 2022-04-17 – 2022-05-04 (×2): qty 12, 84d supply, fill #1
  Filled 2022-05-18 – 2022-11-10 (×3): qty 12, 84d supply, fill #2

## 2021-12-31 MED ORDER — ALBUTEROL SULFATE HFA 108 (90 BASE) MCG/ACT IN AERS
2.0000 | INHALATION_SPRAY | RESPIRATORY_TRACT | 3 refills | Status: DC
  Filled 2021-12-31: qty 18, 17d supply, fill #0
  Filled 2022-03-18: qty 18, 17d supply, fill #1
  Filled 2022-04-17: qty 18, 17d supply, fill #2
  Filled 2022-05-04: qty 13.4, 34d supply, fill #2
  Filled 2022-05-18 – 2022-09-08 (×3): qty 18, 17d supply, fill #2

## 2021-12-31 MED ORDER — TRULICITY 0.75 MG/0.5ML ~~LOC~~ SOAJ
0.7500 mg | SUBCUTANEOUS | 3 refills | Status: DC
  Filled 2021-12-31: qty 2, 28d supply, fill #0
  Filled 2022-03-18: qty 2, 28d supply, fill #1
  Filled 2022-04-17 – 2022-05-04 (×2): qty 2, 28d supply, fill #2
  Filled 2022-05-18 – 2022-07-07 (×2): qty 2, 28d supply, fill #3

## 2021-12-31 MED ORDER — ROSUVASTATIN CALCIUM 20 MG PO TABS
20.0000 mg | ORAL_TABLET | Freq: Every day | ORAL | 3 refills | Status: DC
  Filled 2021-12-31: qty 90, 90d supply, fill #0
  Filled 2022-04-17 – 2022-05-04 (×2): qty 90, 90d supply, fill #1
  Filled 2022-05-18 – 2022-11-10 (×2): qty 90, 90d supply, fill #2

## 2021-12-31 MED ORDER — HYDROCHLOROTHIAZIDE 12.5 MG PO TABS
12.5000 mg | ORAL_TABLET | Freq: Every day | ORAL | 3 refills | Status: DC
  Filled 2021-12-31 – 2022-03-18 (×2): qty 90, 90d supply, fill #0
  Filled 2022-04-17 – 2022-07-07 (×4): qty 90, 90d supply, fill #1
  Filled 2022-11-10: qty 90, 90d supply, fill #2

## 2021-12-31 MED ORDER — LOSARTAN POTASSIUM 50 MG PO TABS
50.0000 mg | ORAL_TABLET | Freq: Every day | ORAL | 3 refills | Status: DC
  Filled 2021-12-31 – 2022-03-18 (×2): qty 90, 90d supply, fill #0
  Filled 2022-04-17 – 2022-07-07 (×4): qty 90, 90d supply, fill #1
  Filled 2022-11-10: qty 90, 90d supply, fill #2

## 2021-12-31 MED ORDER — FLUTICASONE-SALMETEROL 230-21 MCG/ACT IN AERO
2.0000 | INHALATION_SPRAY | Freq: Two times a day (BID) | RESPIRATORY_TRACT | 6 refills | Status: DC
  Filled 2021-12-31 – 2022-03-21 (×2): qty 12, 30d supply, fill #0
  Filled 2022-04-17 – 2022-05-04 (×2): qty 12, 30d supply, fill #1
  Filled 2022-05-18 – 2022-07-07 (×2): qty 12, 30d supply, fill #2
  Filled 2022-09-08 – 2022-11-10 (×2): qty 12, 30d supply, fill #3

## 2021-12-31 MED ORDER — SERTRALINE HCL 100 MG PO TABS
100.0000 mg | ORAL_TABLET | Freq: Every day | ORAL | 3 refills | Status: DC
  Filled 2021-12-31 – 2022-03-18 (×2): qty 90, 90d supply, fill #0
  Filled 2022-04-17 – 2022-07-07 (×4): qty 90, 90d supply, fill #1
  Filled 2022-11-10: qty 90, 90d supply, fill #2

## 2022-01-01 ENCOUNTER — Ambulatory Visit: Payer: 59

## 2022-01-05 ENCOUNTER — Ambulatory Visit (INDEPENDENT_AMBULATORY_CARE_PROVIDER_SITE_OTHER): Payer: 59

## 2022-01-05 DIAGNOSIS — L501 Idiopathic urticaria: Secondary | ICD-10-CM | POA: Diagnosis not present

## 2022-01-06 ENCOUNTER — Other Ambulatory Visit (HOSPITAL_COMMUNITY): Payer: Self-pay

## 2022-01-06 MED ORDER — FLUTICASONE-SALMETEROL 250-50 MCG/ACT IN AEPB
1.0000 | INHALATION_SPRAY | Freq: Two times a day (BID) | RESPIRATORY_TRACT | 0 refills | Status: DC
  Filled 2022-01-06: qty 60, 30d supply, fill #0

## 2022-01-07 ENCOUNTER — Other Ambulatory Visit (HOSPITAL_COMMUNITY): Payer: Self-pay

## 2022-01-13 ENCOUNTER — Other Ambulatory Visit (HOSPITAL_COMMUNITY): Payer: Self-pay

## 2022-01-16 ENCOUNTER — Other Ambulatory Visit (HOSPITAL_COMMUNITY): Payer: Self-pay

## 2022-01-19 ENCOUNTER — Other Ambulatory Visit (HOSPITAL_COMMUNITY): Payer: Self-pay

## 2022-01-22 ENCOUNTER — Other Ambulatory Visit (HOSPITAL_COMMUNITY): Payer: Self-pay

## 2022-01-28 ENCOUNTER — Other Ambulatory Visit (HOSPITAL_COMMUNITY): Payer: Self-pay

## 2022-01-28 ENCOUNTER — Ambulatory Visit: Payer: 59 | Admitting: Allergy

## 2022-01-29 ENCOUNTER — Other Ambulatory Visit (HOSPITAL_COMMUNITY): Payer: Self-pay

## 2022-01-30 ENCOUNTER — Other Ambulatory Visit (HOSPITAL_COMMUNITY): Payer: Self-pay

## 2022-01-31 ENCOUNTER — Other Ambulatory Visit (HOSPITAL_COMMUNITY): Payer: Self-pay

## 2022-02-02 ENCOUNTER — Other Ambulatory Visit (HOSPITAL_COMMUNITY): Payer: Self-pay

## 2022-02-02 ENCOUNTER — Ambulatory Visit (INDEPENDENT_AMBULATORY_CARE_PROVIDER_SITE_OTHER): Payer: 59

## 2022-02-02 DIAGNOSIS — L501 Idiopathic urticaria: Secondary | ICD-10-CM

## 2022-02-03 ENCOUNTER — Other Ambulatory Visit: Payer: Self-pay | Admitting: *Deleted

## 2022-02-03 ENCOUNTER — Other Ambulatory Visit (HOSPITAL_COMMUNITY): Payer: Self-pay

## 2022-02-04 ENCOUNTER — Other Ambulatory Visit (HOSPITAL_COMMUNITY): Payer: Self-pay

## 2022-02-10 ENCOUNTER — Other Ambulatory Visit (HOSPITAL_COMMUNITY): Payer: Self-pay

## 2022-02-10 MED ORDER — PSEUDOEPH-BROMPHEN-DM 30-2-10 MG/5ML PO SYRP
10.0000 mL | ORAL_SOLUTION | ORAL | 0 refills | Status: AC
  Filled 2022-02-10: qty 473, 12d supply, fill #0

## 2022-02-10 NOTE — Progress Notes (Signed)
? ?Follow Up Note ? ?RE: Shelley Mccoy L Shimada MRN: [id number] DOB: [birth year] ?Date of Office Visit: 02/11/2022 ? ?Referring provider: Tylene FantasiaHine-Henderson, Nancy, * ?Primary care provider: Tylene FantasiaHine-Henderson, Nancy, MD ? ?Chief Complaint: Allergies ? ?History of Present Illness: ?I had the pleasure of seeing Shelley Mccoy for a follow up visit at the [facility] on 02/11/2022. She is a 51 y.o. female, who is being followed for chronic urticaria on Xolair 300 mg every 4 weeks, asthma, allergic rhinoconjunctivitis. Her previous allergy office visit was on 09/17/2021 with Dr. Selena BattenKim via telemedicine. Today is a regular follow up visit. ? ?Patient got Xolair injections on 5/8 and a few days afterwards she had issues with nasal congestion, PND, coughing.  ? ?No fevers/chills. ? ?No hives since the last visit. ?Currently receiving Xolair 300mg  every 4 weeks and never had any issues with it.  ? ?Took some cough medicine with some benefit. ?No recent antibiotics.  ?No recent prednisone.  ? ?Taking OTC Claritin 10mg  daily, Flonase 2 sprays per nostril. No nosebleeds. ?  ?Moderate persistent asthma ?Currently using Advair 230mcg 2 puffs twice a day with spacer and rinse mouth afterwards. ? ?Patient was using albuterol last week due to shortness of breath.  ? ?Seasonal and perennial allergic rhinoconjunctivitis ?Stable.  ? ?Assessment and Plan: ?[name] is a 51 y.o. female with: ?Viral upper respiratory infection ?1 week history of nasal congestion, PND, coughing. No fevers/chills. ?Gave handout on URI care.  ?Start Ryaltris (olopatadine + mometasone nasal spray combination) 1-2 sprays per nostril twice a day. Sample given. ?This replaces Flonase.  ?Nasal saline spray (i.e., Simply Saline) or nasal saline lavage (i.e., NeilMed) is recommended as needed and prior to medicated nasal sprays. ?No indication for antibiotics.  ? ?Moderate persistent asthma without complication ?Past history - Diagnosed with asthma 4 years ago.   2020 spirometry showed: possible restrictive disease and 21% improvement in FEV1 post bronchodilator treatment.   ?Interim history - was doing well up until last week. ?No spirometry today as patient was not feeling well. ?Start prednisone taper. Prednisone 10mg  tablets - take 2 tablets for 4 days then 1 tablet on day 5.  ?Monitor sugars.  ?Use albuterol 2 puffs before taking Advair for the next few days.  ?Daily controller medication(s): Advair 230mcg 2 puffs twice a day with spacer and rinse mouth afterwards. ?Prior to physical activity: May use albuterol rescue inhaler 2 puffs 5 to 15 minutes prior to strenuous physical activities. ?Rescue medications: May use albuterol rescue inhaler 2 puffs or nebulizer every 4 to 6 hours as needed for shortness of breath, chest tightness, coughing, and wheezing. Monitor frequency of use.  ?Get spirometry at next visit. ? ?Chronic urticaria ?Past history - Breaking out in rash/hives for the past 3 years.  Urticaria panel unremarkable. Unable to wean to every 5 weeks.  ?Interim history - no outbreaks since the last visit.  ?Xolair injections 300mg  every 4 weeks.   ?If you have side effects after the next injection let me know.  ?Continue with zyrtec 10mg  daily - also for allergies.  ?Monitor symptoms.  ?Avoid the following potential triggers: alcohol, tight clothing, NSAIDs.  ? ?Seasonal and perennial allergic rhinoconjunctivitis ?Past history - Rhinoconjunctivitis symptoms for the past 5 to 10 years mainly during the pollen seasons.  Using Allegra and Atrovent nasal spray with some benefit. 2020 skin testing showed: Positive to ragweed and dust mites, molds and cockroaches. ?Interim history - stable.  ?Continue environmental control measures. ?Continue Flonase 1  spray per nostril 1-2 times a day as needed for nasal congestion.  ?Will use Ryaltris as above for a few weeks then resume Flonase.  ?May use over the counter antihistamines such as Zyrtec (cetirizine), Claritin  (loratadine), Allegra (fexofenadine), or Xyzal (levocetirizine) daily as needed. ? ?Return in about 3 months (around 05/14/2022). ? ?No orders of the defined types were placed in this encounter. ? ?Lab Orders  ?No laboratory test(s) ordered today  ? ? ?Diagnostics: ?None.  ? ?Medication List:  ?Current Outpatient Medications  ?Medication Sig Dispense Refill  ? albuterol (VENTOLIN HFA) 108 (90 Base) MCG/ACT inhaler Inhale 2 puffs into the lungs every 4 (four) hours. 18 g 3  ? Ascorbic Acid (VITAMIN C PO) Take 1 tablet by mouth daily.    ? benzonatate (TESSALON) 200 MG capsule Take 1 capsule (200 mg total) by mouth 3 (three) times daily for 10 days 30 capsule 3  ? brompheniramine-pseudoephedrine-DM 30-2-10 MG/5ML syrup Take 10 mLs by mouth every 4 (four) hours for 10 days. 600 mL 0  ? cetirizine (ZYRTEC) 10 MG tablet Take 1 tablet (10 mg total) by mouth daily. 90 tablet 3  ? cyclobenzaprine (FLEXERIL) 10 MG tablet Take 10 mg by mouth as needed.    ? Dulaglutide (TRULICITY) 0.75 MG/0.5ML SOPN Inject 0.75 mg into the skin once a week. 2 mL 3  ? EPINEPHrine 0.3 mg/0.3 mL IJ SOAJ injection Inject 0.3 mg into the muscle as needed. 2 each 4  ? estradiol (ESTRACE VAGINAL) 0.1 MG/GM vaginal cream Place 1 Applicatorful (4grams) vaginally daily for 2 weeks, then decrease to 2 grams vaginally daily for 2 weeks, then 1 gram vaginally 2 to 3 times weekly. 42.5 g 2  ? Ferrous Sulfate (IRON PO) Take 1 tablet by mouth daily.    ? fluticasone (FLONASE) 50 MCG/ACT nasal spray Place 2 sprays into both nostrils daily. 16 g 3  ? fluticasone-salmeterol (ADVAIR HFA) 230-21 MCG/ACT inhaler Inhale 2 puffs into the lungs 2 (two) times daily. 12 g 6  ? gabapentin (NEURONTIN) 300 MG capsule Take 1 capsule (300 mg total) by mouth 3 (three) times daily. 270 capsule 3  ? glipiZIDE (GLUCOTROL) 10 MG tablet Take 10 mg by mouth daily.    ? hydrochlorothiazide (HYDRODIURIL) 12.5 MG tablet Take 1 tablet (12.5 mg total) by mouth daily. 90 tablet 3  ?  ibuprofen (ADVIL) 800 MG tablet Take 800 mg by mouth 3 (three) times daily.    ? insulin degludec (TRESIBA) 100 UNIT/ML FlexTouch Pen Inject 10 Units into the skin daily.    ? losartan (COZAAR) 50 MG tablet Take 1 tablet (50 mg total) by mouth daily. 90 tablet 3  ? omalizumab (XOLAIR) 150 MG injection INJECT 300 MG SUBCUTANEOUSLY EVERY 4 WEEKS. 2 each 10  ? Omega-3 Fatty Acids (FISH OIL) 1000 MG CAPS Take by mouth.    ? ondansetron (ZOFRAN) 4 MG tablet Take 4 mg by mouth every 8 (eight) hours as needed for nausea or vomiting.    ? rosuvastatin (CRESTOR) 20 MG tablet Take 1 tablet (20 mg total) by mouth daily. 90 tablet 3  ? sertraline (ZOLOFT) 100 MG tablet Take 1 tablet (100 mg total) by mouth daily. 90 tablet 3  ? topiramate (TOPAMAX) 25 MG tablet     ? triamcinolone ointment (KENALOG) 0.1 % Apply 1 gm to affected area daily. 30 g 4  ? valACYclovir (VALTREX) 1000 MG tablet Take 1 tablet (1,000 mg total) by mouth daily. 90 tablet 3  ? Vitamin  D, Ergocalciferol, (DRISDOL) 1.25 MG (50000 UNIT) CAPS capsule Take 1 capsule (50,000 Units total) by mouth once a week. 13 capsule 3  ? pantoprazole (PROTONIX) 40 MG tablet Take by mouth.    ? ?Current Facility-Administered Medications  ?Medication Dose Route Frequency Provider Last Rate Last Admin  ? omalizumab Geoffry Paradise) injection 300 mg  300 mg Subcutaneous Q28 days Alfonse Spruce, MD   300 mg at 02/02/22 1644  ? ?Allergies: ?Allergies  ?Allergen Reactions  ? Percocet [Oxycodone-Acetaminophen] Hives  ? ?I reviewed her past medical history, social history, family history, and environmental history and no significant changes have been reported from her previous visit. ? ?Review of Systems  ?Constitutional:  Negative for appetite change, chills, fatigue, fever and unexpected weight change.  ?HENT:  Positive for congestion and postnasal drip. Negative for rhinorrhea and sneezing.   ?Eyes:  Negative for itching.  ?Respiratory:  Positive for cough. Negative for chest  tightness, shortness of breath and wheezing.   ?Cardiovascular:  Negative for chest pain.  ?Gastrointestinal:  Negative for abdominal pain.  ?Genitourinary:  Negative for difficulty urinating.  ?Skin:  Negative for ras

## 2022-02-11 ENCOUNTER — Ambulatory Visit: Payer: 59 | Admitting: Allergy

## 2022-02-11 ENCOUNTER — Ambulatory Visit (INDEPENDENT_AMBULATORY_CARE_PROVIDER_SITE_OTHER): Payer: 59 | Admitting: Allergy

## 2022-02-11 ENCOUNTER — Encounter: Payer: Self-pay | Admitting: Allergy

## 2022-02-11 VITALS — BP 106/68 | HR 86 | Temp 97.7°F

## 2022-02-11 DIAGNOSIS — J069 Acute upper respiratory infection, unspecified: Secondary | ICD-10-CM | POA: Diagnosis not present

## 2022-02-11 DIAGNOSIS — L508 Other urticaria: Secondary | ICD-10-CM | POA: Diagnosis not present

## 2022-02-11 DIAGNOSIS — J302 Other seasonal allergic rhinitis: Secondary | ICD-10-CM | POA: Diagnosis not present

## 2022-02-11 DIAGNOSIS — J4540 Moderate persistent asthma, uncomplicated: Secondary | ICD-10-CM

## 2022-02-11 DIAGNOSIS — H1010 Acute atopic conjunctivitis, unspecified eye: Secondary | ICD-10-CM

## 2022-02-11 DIAGNOSIS — H1013 Acute atopic conjunctivitis, bilateral: Secondary | ICD-10-CM | POA: Diagnosis not present

## 2022-02-11 NOTE — Assessment & Plan Note (Signed)
>>  ASSESSMENT AND PLAN FOR MODERATE PERSISTENT ASTHMA WITHOUT COMPLICATION WRITTEN ON 02/11/2022  4:46 PM BY Ellamae Sia, DO  Past history - Diagnosed with asthma 4 years ago.  2020 spirometry showed: possible restrictive disease and 21% improvement in FEV1 post bronchodilator treatment.   Interim history - was doing well up until last week.  No spirometry today as patient was not feeling well.  Start prednisone taper. Prednisone 10mg  tablets - take 2 tablets for 4 days then 1 tablet on day 5.   Monitor sugars.   Use albuterol 2 puffs before taking Advair for the next few days.   Daily controller medication(s): Advair 2 puffs twice a day with spacer and rinse mouth afterwards.  Prior to physical activity: May use albuterol rescue inhaler 2 puffs 5 to 15 minutes prior to strenuous physical activities.  Rescue medications: May use albuterol rescue inhaler 2 puffs or nebulizer every 4 to 6 hours as needed for shortness of breath, chest tightness, coughing, and wheezing. Monitor frequency of use.   Get spirometry at next visit.

## 2022-02-11 NOTE — Assessment & Plan Note (Signed)
Past history - Breaking out in rash/hives for the past 3 years.  Urticaria panel unremarkable. Unable to wean to every 5 weeks.  ?Interim history - no outbreaks since the last visit.  ?? Xolair injections 300mg  every 4 weeks.   ?? If you have side effects after the next injection let me know.  ?? Continue with zyrtec 10mg  daily - also for allergies.  ?? Monitor symptoms.  ?? Avoid the following potential triggers: alcohol, tight clothing, NSAIDs.  ?

## 2022-02-11 NOTE — Assessment & Plan Note (Signed)
Past history - Diagnosed with asthma 4 years ago.  2020 spirometry showed: possible restrictive disease and 21% improvement in FEV1 post bronchodilator treatment.   ?Interim history - was doing well up until last week. ?? No spirometry today as patient was not feeling well. ?? Start prednisone taper. Prednisone 10mg  tablets - take 2 tablets for 4 days then 1 tablet on day 5.  ?? Monitor sugars.  ?? Use albuterol 2 puffs before taking Advair for the next few days.  ?? Daily controller medication(s): Advair 2 puffs twice a day with spacer and rinse mouth afterwards. ?? Prior to physical activity: May use albuterol rescue inhaler 2 puffs 5 to 15 minutes prior to strenuous physical activities. ?? Rescue medications: May use albuterol rescue inhaler 2 puffs or nebulizer every 4 to 6 hours as needed for shortness of breath, chest tightness, coughing, and wheezing. Monitor frequency of use.  ?? Get spirometry at next visit. ?

## 2022-02-11 NOTE — Assessment & Plan Note (Signed)
1 week history of nasal congestion, PND, coughing. No fevers/chills. ?? Gave handout on URI care.  ?? Start Ryaltris (olopatadine + mometasone nasal spray combination) 1-2 sprays per nostril twice a day. Sample given. ?o This replaces Flonase.  ?? Nasal saline spray (i.e., Simply Saline) or nasal saline lavage (i.e., NeilMed) is recommended as needed and prior to medicated nasal sprays. ?? No indication for antibiotics.  ?

## 2022-02-11 NOTE — Patient Instructions (Addendum)
Upper respiratory infection/sinusitis ?See below for instructions. ?Start Ryaltris (olopatadine + mometasone nasal spray combination) 1-2 sprays per nostril twice a day. Sample given. ?This replaces Flonase.  ?Nasal saline spray (i.e., Simply Saline) or nasal saline lavage (i.e., NeilMed) is recommended as needed and prior to medicated nasal sprays. ?Start prednisone taper. Prednisone 10mg  tablets - take 2 tablets for 4 days then 1 tablet on day 5.  ?Monitor sugars.  ?If you start having fevers/chills or discolored mucous then let me know as you may need antibiotics then.  ? ?Chronic urticaria ?Xolair injections 300mg  every 4 weeks.   ?If you have side effects after the next injection let me know.  ?Continue with zyrtec 10mg  daily - also for allergies.  ?Monitor symptoms.  ?Avoid the following potential triggers: alcohol, tight clothing, NSAIDs.  ?  ?Moderate persistent asthma  ?Use albuterol 2 puffs before taking Advair for the next few days.  ?Daily controller medication(s): Advair 2 puffs twice a day with spacer and rinse mouth afterwards. ?Prior to physical activity: May use albuterol rescue inhaler 2 puffs 5 to 15 minutes prior to strenuous physical activities. ?Rescue medications: May use albuterol rescue inhaler 2 puffs or nebulizer every 4 to 6 hours as needed for shortness of breath, chest tightness, coughing, and wheezing. Monitor frequency of use.  ?Asthma control goals:  ?Full participation in all desired activities (may need albuterol before activity) ?Albuterol use two times or less a week on average (not counting use with activity) ?Cough interfering with sleep two times or less a month ?Oral steroids no more than once a year ?No hospitalizations ?  ?Seasonal and perennial allergic rhinoconjunctivitis ?2020 skin testing showed: Positive to ragweed and dust mites, molds and cockroaches. ?Continue environmental control measures. ?May use over the counter antihistamines such as Zyrtec  (cetirizine), Claritin (loratadine), Allegra (fexofenadine), or Xyzal (levocetirizine) daily as needed. ? ?Follow up in 3 months or sooner if needed. ? ?Drink plenty of fluids. ?Water, juice, clear broth or warm lemon water are good choices. Avoid caffeine and alcohol, which can dehydrate you. ?Eat chicken soup. ?Chicken soup and other warm fluids can be soothing and loosen congestion. ?Rest. ?Adjust your room's temperature and humidity. ?Keep your room warm but not overheated. If the air is dry, a cool-mist humidifier or vaporizer can moisten the air and help ease congestion and coughing. Keep the humidifier clean to prevent the growth of bacteria and molds. ?Soothe your throat. ?Perform a saltwater gargle. Dissolve one-quarter to a half teaspoon of salt in a 4- to 8-ounce glass of warm water. This can relieve a sore or scratchy throat temporarily. ?Use saline nasal drops. ?To help relieve nasal congestion, try saline nasal drops. You can buy these drops over the counter, and they can help relieve symptoms ? even in children. ?Take over-the-counter cold and cough medications. ?For adults and children older than 5, over-the-counter decongestants, antihistamines and pain relievers might offer some symptom relief. However, they won't prevent a cold or shorten its duration. ? ? ?Buffered Isotonic Saline Irrigations: ? ?Goal: ?When you irrigate with the isotonic saline (salt water) it washes mucous and other debris from your nose that could be contributing to your nasal symptoms.  ? ?Recipe: ?Obtain 1 quart jar that is clean ?Fill with clean (bottled, boiled or distilled) water ?Add 1-2 heaping teaspoons of salt without iodine ?If the solution with 2 teaspoons of salt is too strong, adjust the amount down until better tolerated ?Add 1 teaspoon of [employer] baking  soda (pure bicarbonate) ?Mix ingredients together and store at room temperature and discard after 1 week ?* Alternatively you can buy pre made salt packets  for the NeilMed bottle or there    ?      are other over the counter brands available ? ?Instructions: ?Warm ? cup of the solution in the microwave if desired but be careful not to overheat as this will burn the inside of your nose ?Stand over a sink (or do it while you shower) and squirt the solution into one side of your nose aiming towards the back of your head ?Sometimes saying ?coca cola? while irrigating can be helpful to prevent fluid from going down your throat  ?The solution will travel to the back of your nose and then come out the other side ?Perform this again on the other side ?Try to do this twice a day ?If you are using a nasal spray in addition to the irrigation, irrigate first and then use the topical nasal spray otherwise you will wash the nasal spray out of your nose ?

## 2022-02-11 NOTE — Assessment & Plan Note (Signed)
Past history - Rhinoconjunctivitis symptoms for the past 5 to 10 years mainly during the pollen seasons.  Using Allegra and Atrovent nasal spray with some benefit. 2020 skin testing showed: Positive to ragweed and dust mites, molds and cockroaches. ?Interim history - stable.  ?? Continue environmental control measures. ?? Continue Flonase 1 spray per nostril 1-2 times a day as needed for nasal congestion.  ?? Will use Ryaltris as above for a few weeks then resume Flonase.  ?? May use over the counter antihistamines such as Zyrtec (cetirizine), Claritin (loratadine), Allegra (fexofenadine), or Xyzal (levocetirizine) daily as needed. ?

## 2022-02-26 ENCOUNTER — Other Ambulatory Visit (HOSPITAL_COMMUNITY): Payer: Self-pay

## 2022-03-02 ENCOUNTER — Ambulatory Visit: Payer: 59

## 2022-03-02 ENCOUNTER — Other Ambulatory Visit (HOSPITAL_COMMUNITY): Payer: Self-pay

## 2022-03-03 ENCOUNTER — Ambulatory Visit (INDEPENDENT_AMBULATORY_CARE_PROVIDER_SITE_OTHER): Payer: 59

## 2022-03-03 DIAGNOSIS — L501 Idiopathic urticaria: Secondary | ICD-10-CM

## 2022-03-04 ENCOUNTER — Ambulatory Visit: Payer: 59

## 2022-03-04 DIAGNOSIS — R310 Gross hematuria: Secondary | ICD-10-CM | POA: Diagnosis not present

## 2022-03-18 ENCOUNTER — Other Ambulatory Visit (HOSPITAL_COMMUNITY): Payer: Self-pay

## 2022-03-23 ENCOUNTER — Other Ambulatory Visit (HOSPITAL_COMMUNITY): Payer: Self-pay

## 2022-03-23 DIAGNOSIS — E119 Type 2 diabetes mellitus without complications: Secondary | ICD-10-CM | POA: Diagnosis not present

## 2022-03-23 DIAGNOSIS — Z6841 Body Mass Index (BMI) 40.0 and over, adult: Secondary | ICD-10-CM | POA: Diagnosis not present

## 2022-03-23 DIAGNOSIS — M25572 Pain in left ankle and joints of left foot: Secondary | ICD-10-CM | POA: Diagnosis not present

## 2022-03-25 ENCOUNTER — Other Ambulatory Visit (HOSPITAL_COMMUNITY): Payer: Self-pay

## 2022-04-01 ENCOUNTER — Ambulatory Visit (INDEPENDENT_AMBULATORY_CARE_PROVIDER_SITE_OTHER): Payer: 59

## 2022-04-01 DIAGNOSIS — J309 Allergic rhinitis, unspecified: Secondary | ICD-10-CM

## 2022-04-01 DIAGNOSIS — E785 Hyperlipidemia, unspecified: Secondary | ICD-10-CM | POA: Diagnosis not present

## 2022-04-01 DIAGNOSIS — M25572 Pain in left ankle and joints of left foot: Secondary | ICD-10-CM | POA: Diagnosis not present

## 2022-04-01 DIAGNOSIS — Z6841 Body Mass Index (BMI) 40.0 and over, adult: Secondary | ICD-10-CM | POA: Diagnosis not present

## 2022-04-01 DIAGNOSIS — L501 Idiopathic urticaria: Secondary | ICD-10-CM | POA: Diagnosis not present

## 2022-04-01 DIAGNOSIS — F411 Generalized anxiety disorder: Secondary | ICD-10-CM | POA: Diagnosis not present

## 2022-04-01 DIAGNOSIS — E1165 Type 2 diabetes mellitus with hyperglycemia: Secondary | ICD-10-CM | POA: Diagnosis not present

## 2022-04-01 DIAGNOSIS — I10 Essential (primary) hypertension: Secondary | ICD-10-CM | POA: Diagnosis not present

## 2022-04-01 DIAGNOSIS — M25472 Effusion, left ankle: Secondary | ICD-10-CM | POA: Diagnosis not present

## 2022-04-03 ENCOUNTER — Other Ambulatory Visit (HOSPITAL_COMMUNITY): Payer: Self-pay

## 2022-04-09 ENCOUNTER — Other Ambulatory Visit (HOSPITAL_COMMUNITY): Payer: Self-pay

## 2022-04-15 DIAGNOSIS — Z23 Encounter for immunization: Secondary | ICD-10-CM | POA: Diagnosis not present

## 2022-04-15 DIAGNOSIS — R310 Gross hematuria: Secondary | ICD-10-CM | POA: Diagnosis not present

## 2022-04-16 ENCOUNTER — Other Ambulatory Visit (HOSPITAL_COMMUNITY): Payer: Self-pay

## 2022-04-16 MED ORDER — VALACYCLOVIR HCL 1 G PO TABS
1000.0000 mg | ORAL_TABLET | Freq: Every day | ORAL | 3 refills | Status: DC
  Filled 2022-04-16 – 2022-07-07 (×5): qty 90, 90d supply, fill #0
  Filled 2022-11-10: qty 90, 90d supply, fill #1

## 2022-04-17 ENCOUNTER — Other Ambulatory Visit: Payer: Self-pay | Admitting: Obstetrics and Gynecology

## 2022-04-17 ENCOUNTER — Other Ambulatory Visit (HOSPITAL_COMMUNITY): Payer: Self-pay

## 2022-04-17 DIAGNOSIS — R310 Gross hematuria: Secondary | ICD-10-CM | POA: Diagnosis not present

## 2022-04-17 MED ORDER — FLUTICASONE PROPIONATE 50 MCG/ACT NA SUSP
2.0000 | Freq: Every day | NASAL | 3 refills | Status: DC
  Filled 2022-04-17 – 2022-05-04 (×2): qty 16, 30d supply, fill #0
  Filled 2022-05-18 – 2022-07-07 (×2): qty 16, 30d supply, fill #1
  Filled 2022-09-08: qty 16, 30d supply, fill #2
  Filled 2022-11-10: qty 16, 30d supply, fill #3

## 2022-04-22 ENCOUNTER — Other Ambulatory Visit (HOSPITAL_COMMUNITY): Payer: Self-pay

## 2022-04-23 ENCOUNTER — Other Ambulatory Visit (HOSPITAL_COMMUNITY): Payer: Self-pay

## 2022-04-23 ENCOUNTER — Other Ambulatory Visit: Payer: Self-pay | Admitting: Obstetrics and Gynecology

## 2022-04-27 ENCOUNTER — Other Ambulatory Visit (HOSPITAL_COMMUNITY): Payer: Self-pay

## 2022-04-27 DIAGNOSIS — R319 Hematuria, unspecified: Secondary | ICD-10-CM | POA: Diagnosis not present

## 2022-04-27 DIAGNOSIS — R310 Gross hematuria: Secondary | ICD-10-CM | POA: Diagnosis not present

## 2022-04-28 ENCOUNTER — Other Ambulatory Visit: Payer: Self-pay | Admitting: Obstetrics and Gynecology

## 2022-04-28 ENCOUNTER — Other Ambulatory Visit (HOSPITAL_COMMUNITY): Payer: Self-pay

## 2022-04-29 ENCOUNTER — Ambulatory Visit: Payer: 59

## 2022-04-29 ENCOUNTER — Other Ambulatory Visit (HOSPITAL_COMMUNITY): Payer: Self-pay

## 2022-05-04 ENCOUNTER — Ambulatory Visit (INDEPENDENT_AMBULATORY_CARE_PROVIDER_SITE_OTHER): Payer: 59

## 2022-05-04 ENCOUNTER — Other Ambulatory Visit (HOSPITAL_COMMUNITY): Payer: Self-pay

## 2022-05-04 DIAGNOSIS — L501 Idiopathic urticaria: Secondary | ICD-10-CM | POA: Diagnosis not present

## 2022-05-04 MED ORDER — ALBUTEROL SULFATE HFA 108 (90 BASE) MCG/ACT IN AERS
2.0000 | INHALATION_SPRAY | RESPIRATORY_TRACT | 1 refills | Status: DC
  Filled 2022-05-04: qty 6.7, 17d supply, fill #0
  Filled 2022-05-18: qty 6.7, 17d supply, fill #1

## 2022-05-05 ENCOUNTER — Ambulatory Visit: Payer: 59

## 2022-05-06 ENCOUNTER — Other Ambulatory Visit (HOSPITAL_COMMUNITY): Payer: Self-pay

## 2022-05-17 NOTE — Progress Notes (Deleted)
Follow Up Note  RE: Shelley Mccoy MRN: [id number] DOB: 10-Mar-1971 Date of Office Visit: 05/18/2022  Referring provider: Tylene Fantasia, * Primary care provider: Tylene Fantasia, MD  Chief Complaint: No chief complaint on file.  History of Present Illness: I had the pleasure of seeing Shelley Mccoy for a follow up visit at the [facility] on 05/17/2022. She is a 51 y.o. female, who is being followed for asthma, chronic urticaria on Xolair and allergic rhinoconjunctivitis. Her previous allergy office visit was on 02/11/2022 with Dr. Selena Batten. Today is a regular follow up visit.  Viral upper respiratory infection 1 week history of nasal congestion, PND, coughing. No fevers/chills. Gave handout on URI care.  Start Ryaltris (olopatadine + mometasone nasal spray combination) 1-2 sprays per nostril twice a day. Sample given. This replaces Flonase.  Nasal saline spray (i.e., Simply Saline) or nasal saline lavage (i.e., NeilMed) is recommended as needed and prior to medicated nasal sprays. No indication for antibiotics.    Moderate persistent asthma without complication Past history - Diagnosed with asthma 4 years ago.  2020 spirometry showed: possible restrictive disease and 21% improvement in FEV1 post bronchodilator treatment.   Interim history - was doing well up until last week. No spirometry today as patient was not feeling well. Start prednisone taper. Prednisone 10mg  tablets - take 2 tablets for 4 days then 1 tablet on day 5.  Monitor sugars.  Use albuterol 2 puffs before taking Advair for the next few days.  Daily controller medication(s): Advair 2 puffs twice a day with spacer and rinse mouth afterwards. Prior to physical activity: May use albuterol rescue inhaler 2 puffs 5 to 15 minutes prior to strenuous physical activities. Rescue medications: May use albuterol rescue inhaler 2 puffs or nebulizer every 4 to 6 hours as needed for shortness of  breath, chest tightness, coughing, and wheezing. Monitor frequency of use.  Get spirometry at next visit.   Chronic urticaria Past history - Breaking out in rash/hives for the past 3 years.  Urticaria panel unremarkable. Unable to wean to every 5 weeks.  Interim history - no outbreaks since the last visit.  Xolair injections 300mg  every 4 weeks.   If you have side effects after the next injection let me know.  Continue with zyrtec 10mg  daily - also for allergies.  Monitor symptoms.  Avoid the following potential triggers: alcohol, tight clothing, NSAIDs.    Seasonal and perennial allergic rhinoconjunctivitis Past history - Rhinoconjunctivitis symptoms for the past 5 to 10 years mainly during the pollen seasons.  Using Allegra and Atrovent nasal spray with some benefit. 2020 skin testing showed: Positive to ragweed and dust mites, molds and cockroaches. Interim history - stable.  Continue environmental control measures. Continue Flonase 1 spray per nostril 1-2 times a day as needed for nasal congestion.  Will use Ryaltris as above for a few weeks then resume Flonase.  May use over the counter antihistamines such as Zyrtec (cetirizine), Claritin (loratadine), Allegra (fexofenadine), or Xyzal (levocetirizine) daily as needed.   Return in about 3 months (around 05/14/2022).  Assessment and Plan: Shelley Mccoy is a 51 y.o. female with: No problem-specific Assessment & Plan notes found for this encounter.  No follow-ups on file.  No orders of the defined types were placed in this encounter.  Lab Orders  No laboratory test(s) ordered today    Diagnostics: Spirometry:  Tracings reviewed. Her effort: {Blank single:19197::"Good reproducible efforts.","It was hard to get consistent efforts and there is  a question as to whether this reflects a maximal maneuver.","Poor effort, data can not be interpreted."} FVC: ***L FEV1: ***L, ***% predicted FEV1/FVC ratio: ***% Interpretation: {Blank  single:19197::"Spirometry consistent with mild obstructive disease","Spirometry consistent with moderate obstructive disease","Spirometry consistent with severe obstructive disease","Spirometry consistent with possible restrictive disease","Spirometry consistent with mixed obstructive and restrictive disease","Spirometry uninterpretable due to technique","Spirometry consistent with normal pattern","No overt abnormalities noted given today's efforts"}.  Please see scanned spirometry results for details.  Skin Testing: {Blank single:19197::"Select foods","Environmental allergy panel","Environmental allergy panel and select foods","Food allergy panel","None","Deferred due to recent antihistamines use"}. *** Results discussed with patient/family.   Medication List:  Current Outpatient Medications  Medication Sig Dispense Refill  . albuterol (VENTOLIN HFA) 108 (90 Base) MCG/ACT inhaler Inhale 2 puffs into the lungs every 4 (four) hours. 18 g 3  . albuterol (VENTOLIN HFA) 108 (90 Base) MCG/ACT inhaler Inhale 2 puffs into the lungs every 4 (four) hours. 6.7 g 1  . Ascorbic Acid (VITAMIN C PO) Take 1 tablet by mouth daily.    . benzonatate (TESSALON) 200 MG capsule Take 1 capsule (200 mg total) by mouth 3 (three) times daily for 10 days 30 capsule 3  . cetirizine (ZYRTEC) 10 MG tablet Take 1 tablet (10 mg total) by mouth daily. 90 tablet 3  . cyclobenzaprine (FLEXERIL) 10 MG tablet Take 10 mg by mouth as needed.    . Dulaglutide (TRULICITY) 0.75 MG/0.5ML SOPN Inject 0.75 mg into the skin once a week. 2 mL 3  . EPINEPHrine 0.3 mg/0.3 mL IJ SOAJ injection Inject 0.3 mg into the muscle as needed. 2 each 4  . estradiol (ESTRACE VAGINAL) 0.1 MG/GM vaginal cream Place 1 Applicatorful (4grams) vaginally daily for 2 weeks, then decrease to 2 grams vaginally daily for 2 weeks, then 1 gram vaginally 2 to 3 times weekly. 42.5 g 2  . Ferrous Sulfate (IRON PO) Take 1 tablet by mouth daily.    . fluticasone (FLONASE)  50 MCG/ACT nasal spray Place 2 sprays into both nostrils daily. 16 g 3  . fluticasone-salmeterol (ADVAIR HFA) 230-21 MCG/ACT inhaler Inhale 2 puffs into the lungs 2 (two) times daily. 12 g 6  . gabapentin (NEURONTIN) 300 MG capsule Take 1 capsule (300 mg total) by mouth 3 (three) times daily. 270 capsule 3  . glipiZIDE (GLUCOTROL) 10 MG tablet Take 10 mg by mouth daily.    . hydrochlorothiazide (HYDRODIURIL) 12.5 MG tablet Take 1 tablet (12.5 mg total) by mouth daily. 90 tablet 3  . ibuprofen (ADVIL) 800 MG tablet Take 800 mg by mouth 3 (three) times daily.    . insulin degludec (TRESIBA) 100 UNIT/ML FlexTouch Pen Inject 10 Units into the skin daily.    Marland Kitchen losartan (COZAAR) 50 MG tablet Take 1 tablet (50 mg total) by mouth daily. 90 tablet 3  . omalizumab (XOLAIR) 150 MG injection INJECT 300 MG SUBCUTANEOUSLY EVERY 4 WEEKS. 2 each 10  . Omega-3 Fatty Acids (FISH OIL) 1000 MG CAPS Take by mouth.    . ondansetron (ZOFRAN) 4 MG tablet Take 4 mg by mouth every 8 (eight) hours as needed for nausea or vomiting.    . pantoprazole (PROTONIX) 40 MG tablet Take by mouth.    . rosuvastatin (CRESTOR) 20 MG tablet Take 1 tablet (20 mg total) by mouth daily. 90 tablet 3  . sertraline (ZOLOFT) 100 MG tablet Take 1 tablet (100 mg total) by mouth daily. 90 tablet 3  . topiramate (TOPAMAX) 25 MG tablet     . triamcinolone  ointment (KENALOG) 0.1 % Apply 1 gm to affected area daily. 30 g 4  . valACYclovir (VALTREX) 1000 MG tablet Take 1 tablet (1,000 mg total) by mouth daily. 90 tablet 3  . Vitamin D, Ergocalciferol, (DRISDOL) 1.25 MG (50000 UNIT) CAPS capsule Take 1 capsule (50,000 Units total) by mouth once a week. 13 capsule 3   Current Facility-Administered Medications  Medication Dose Route Frequency Provider Last Rate Last Admin  . omalizumab Geoffry Paradise) injection 300 mg  300 mg Subcutaneous Q28 days Alfonse Spruce, MD   300 mg at 05/04/22 1341   Allergies: Allergies  Allergen Reactions  . Percocet  [Oxycodone-Acetaminophen] Hives   I reviewed her past medical history, social history, family history, and environmental history and no significant changes have been reported from her previous visit.  Review of Systems  Constitutional:  Negative for appetite change, chills, fatigue, fever and unexpected weight change.  HENT:  Positive for congestion and postnasal drip. Negative for rhinorrhea and sneezing.   Eyes:  Negative for itching.  Respiratory:  Positive for cough. Negative for chest tightness, shortness of breath and wheezing.   Cardiovascular:  Negative for chest pain.  Gastrointestinal:  Negative for abdominal pain.  Genitourinary:  Negative for difficulty urinating.  Skin:  Negative for rash.  Allergic/Immunologic: Positive for environmental allergies. Negative for food allergies.   Objective: There were no vitals taken for this visit. There is no height or weight on file to calculate BMI. Physical Exam Vitals and nursing note reviewed.  Constitutional:      Appearance: Normal appearance. She is well-developed.  HENT:     Head: Normocephalic and atraumatic.     Right Ear: Tympanic membrane and external ear normal.     Left Ear: Tympanic membrane and external ear normal.     Nose: Congestion present.  Eyes:     Conjunctiva/sclera: Conjunctivae normal.  Cardiovascular:     Rate and Rhythm: Normal rate and regular rhythm.     Heart sounds:     No friction rub. No gallop.  Pulmonary:     Effort: Pulmonary effort is normal.     Breath sounds: Normal breath sounds. No wheezing or rales.  Musculoskeletal:     Cervical back: Neck supple.  Skin:    General: Skin is warm.     Findings: No rash.  Neurological:     Mental Status: She is alert and oriented to person, place, and time.  Psychiatric:        Behavior: Behavior normal.  Previous notes and tests were reviewed. The plan was reviewed with the patient/family, and all questions/concerned were addressed.  It was my  pleasure to see Shelley Mccoy today and participate in her care. Please feel free to contact me with any questions or concerns.  Sincerely,  Wyline Mood, DO Allergy & Immunology  [facility] office: [phone] [facility] office: [phone]

## 2022-05-18 ENCOUNTER — Other Ambulatory Visit (HOSPITAL_COMMUNITY): Payer: Self-pay

## 2022-05-18 ENCOUNTER — Other Ambulatory Visit: Payer: Self-pay | Admitting: Obstetrics and Gynecology

## 2022-05-18 ENCOUNTER — Ambulatory Visit: Payer: 59 | Admitting: Allergy

## 2022-05-18 DIAGNOSIS — H1010 Acute atopic conjunctivitis, unspecified eye: Secondary | ICD-10-CM

## 2022-05-18 DIAGNOSIS — L508 Other urticaria: Secondary | ICD-10-CM

## 2022-05-18 DIAGNOSIS — J4540 Moderate persistent asthma, uncomplicated: Secondary | ICD-10-CM

## 2022-05-19 ENCOUNTER — Other Ambulatory Visit (HOSPITAL_COMMUNITY): Payer: Self-pay

## 2022-05-20 ENCOUNTER — Other Ambulatory Visit (HOSPITAL_COMMUNITY): Payer: Self-pay

## 2022-05-22 ENCOUNTER — Other Ambulatory Visit (HOSPITAL_COMMUNITY): Payer: Self-pay

## 2022-05-27 ENCOUNTER — Other Ambulatory Visit (HOSPITAL_COMMUNITY): Payer: Self-pay

## 2022-06-04 ENCOUNTER — Other Ambulatory Visit (HOSPITAL_COMMUNITY): Payer: Self-pay

## 2022-06-07 NOTE — Progress Notes (Unsigned)
Follow Up Note  RE: HADASA GASNER MRN: [id number] DOB: 12/18/1970 Date of Office Visit: 06/08/2022  Referring provider: Tylene Fantasia, * Primary care provider: Tylene Fantasia, MD  Chief Complaint: No chief complaint on file.  History of Present Illness: I had the pleasure of seeing Breanne Olvera for a follow up visit at the [facility] on 06/07/2022. She is a 51 y.o. female, who is being followed for asthma, CIU on Xolair, allergic rhinoconjunctivitis. Her previous allergy office visit was on 02/11/2022 with Dr. Selena Batten. Today is a regular follow up visit.  Viral upper respiratory infection 1 week history of nasal congestion, PND, coughing. No fevers/chills. Gave handout on URI care.  Start Ryaltris (olopatadine + mometasone nasal spray combination) 1-2 sprays per nostril twice a day. Sample given. This replaces Flonase.  Nasal saline spray (i.e., Simply Saline) or nasal saline lavage (i.e., NeilMed) is recommended as needed and prior to medicated nasal sprays. No indication for antibiotics.    Moderate persistent asthma without complication Past history - Diagnosed with asthma 4 years ago.  2020 spirometry showed: possible restrictive disease and 21% improvement in FEV1 post bronchodilator treatment.   Interim history - was doing well up until last week. No spirometry today as patient was not feeling well. Start prednisone taper. Prednisone 10mg  tablets - take 2 tablets for 4 days then 1 tablet on day 5.  Monitor sugars.  Use albuterol 2 puffs before taking Advair for the next few days.  Daily controller medication(s): Advair 2 puffs twice a day with spacer and rinse mouth afterwards. Prior to physical activity: May use albuterol rescue inhaler 2 puffs 5 to 15 minutes prior to strenuous physical activities. Rescue medications: May use albuterol rescue inhaler 2 puffs or nebulizer every 4 to 6 hours as needed for shortness of breath, chest  tightness, coughing, and wheezing. Monitor frequency of use.  Get spirometry at next visit.   Chronic urticaria Past history - Breaking out in rash/hives for the past 3 years.  Urticaria panel unremarkable. Unable to wean to every 5 weeks.  Interim history - no outbreaks since the last visit.  Xolair injections 300mg  every 4 weeks.   If you have side effects after the next injection let me know.  Continue with zyrtec 10mg  daily - also for allergies.  Monitor symptoms.  Avoid the following potential triggers: alcohol, tight clothing, NSAIDs.    Seasonal and perennial allergic rhinoconjunctivitis Past history - Rhinoconjunctivitis symptoms for the past 5 to 10 years mainly during the pollen seasons.  Using Allegra and Atrovent nasal spray with some benefit. 2020 skin testing showed: Positive to ragweed and dust mites, molds and cockroaches. Interim history - stable.  Continue environmental control measures. Continue Flonase 1 spray per nostril 1-2 times a day as needed for nasal congestion.  Will use Ryaltris as above for a few weeks then resume Flonase.  May use over the counter antihistamines such as Zyrtec (cetirizine), Claritin (loratadine), Allegra (fexofenadine), or Xyzal (levocetirizine) daily as needed.   Return in about 3 months (around 05/14/2022).  Assessment and Plan: Analysa is a 51 y.o. female with: No problem-specific Assessment & Plan notes found for this encounter.  No follow-ups on file.  No orders of the defined types were placed in this encounter.  Lab Orders  No laboratory test(s) ordered today    Diagnostics: Spirometry:  Tracings reviewed. Her effort: {Blank single:19197::"Good reproducible efforts.","It was hard to get consistent efforts and there is a question  as to whether this reflects a maximal maneuver.","Poor effort, data can not be interpreted."} FVC: ***L FEV1: ***L, ***% predicted FEV1/FVC ratio: ***% Interpretation: {Blank  single:19197::"Spirometry consistent with mild obstructive disease","Spirometry consistent with moderate obstructive disease","Spirometry consistent with severe obstructive disease","Spirometry consistent with possible restrictive disease","Spirometry consistent with mixed obstructive and restrictive disease","Spirometry uninterpretable due to technique","Spirometry consistent with normal pattern","No overt abnormalities noted given today's efforts"}.  Please see scanned spirometry results for details.  Skin Testing: {Blank single:19197::"Select foods","Environmental allergy panel","Environmental allergy panel and select foods","Food allergy panel","None","Deferred due to recent antihistamines use"}. *** Results discussed with patient/family.   Medication List:  Current Outpatient Medications  Medication Sig Dispense Refill  . albuterol (VENTOLIN HFA) 108 (90 Base) MCG/ACT inhaler Inhale 2 puffs into the lungs every 4 (four) hours. 18 g 3  . albuterol (VENTOLIN HFA) 108 (90 Base) MCG/ACT inhaler Inhale 2 puffs into the lungs every 4 (four) hours. 6.7 g 1  . Ascorbic Acid (VITAMIN C PO) Take 1 tablet by mouth daily.    . benzonatate (TESSALON) 200 MG capsule Take 1 capsule (200 mg total) by mouth 3 (three) times daily for 10 days 30 capsule 3  . cetirizine (ZYRTEC) 10 MG tablet Take 1 tablet (10 mg total) by mouth daily. 90 tablet 3  . cyclobenzaprine (FLEXERIL) 10 MG tablet Take 10 mg by mouth as needed.    . Dulaglutide (TRULICITY) [id number] [id number] SOPN Inject 0.75 mg into the skin once a week. 2 mL 3  . EPINEPHrine 0.3 mg/0.3 mL IJ SOAJ injection Inject 0.3 mg into the muscle as needed. 2 each 4  . estradiol (ESTRACE VAGINAL) 0.1 MG/GM vaginal cream Place 1 Applicatorful (4grams) vaginally daily for 2 weeks, then decrease to 2 grams vaginally daily for 2 weeks, then 1 gram vaginally 2 to 3 times weekly. 42.5 g 2  . Ferrous Sulfate (IRON PO) Take 1 tablet by mouth daily.    . fluticasone (FLONASE)  50 MCG/ACT nasal spray Place 2 sprays into both nostrils daily. 16 g 3  . fluticasone-salmeterol (ADVAIR HFA) 230-21 MCG/ACT inhaler Inhale 2 puffs into the lungs 2 (two) times daily. 12 g 6  . gabapentin (NEURONTIN) 300 MG capsule Take 1 capsule (300 mg total) by mouth 3 (three) times daily. 270 capsule 3  . glipiZIDE (GLUCOTROL) 10 MG tablet Take 10 mg by mouth daily.    . hydrochlorothiazide (HYDRODIURIL) 12.5 MG tablet Take 1 tablet (12.5 mg total) by mouth daily. 90 tablet 3  . ibuprofen (ADVIL) 800 MG tablet Take 800 mg by mouth 3 (three) times daily.    . insulin degludec (TRESIBA) 100 UNIT/ML FlexTouch Pen Inject 10 Units into the skin daily.    Marland Kitchen losartan (COZAAR) 50 MG tablet Take 1 tablet (50 mg total) by mouth daily. 90 tablet 3  . omalizumab (XOLAIR) 150 MG injection INJECT 300 MG SUBCUTANEOUSLY EVERY 4 WEEKS. 2 each 10  . Omega-3 Fatty Acids (FISH OIL) 1000 MG CAPS Take by mouth.    . ondansetron (ZOFRAN) 4 MG tablet Take 4 mg by mouth every 8 (eight) hours as needed for nausea or vomiting.    . pantoprazole (PROTONIX) 40 MG tablet Take by mouth.    . rosuvastatin (CRESTOR) 20 MG tablet Take 1 tablet (20 mg total) by mouth daily. 90 tablet 3  . sertraline (ZOLOFT) 100 MG tablet Take 1 tablet (100 mg total) by mouth daily. 90 tablet 3  . topiramate (TOPAMAX) 25 MG tablet     . triamcinolone ointment (KENALOG)  0.1 % Apply 1 gm to affected area daily. 30 g 4  . valACYclovir (VALTREX) 1000 MG tablet Take 1 tablet (1,000 mg total) by mouth daily. 90 tablet 3  . Vitamin D, Ergocalciferol, (DRISDOL) 1.25 MG (50000 UNIT) CAPS capsule Take 1 capsule (50,000 Units total) by mouth once a week. 13 capsule 3   Current Facility-Administered Medications  Medication Dose Route Frequency Provider Last Rate Last Admin  . omalizumab Geoffry Paradise) injection 300 mg  300 mg Subcutaneous Q28 days Alfonse Spruce, MD   300 mg at 05/04/22 1341   Allergies: Allergies  Allergen Reactions  . Percocet  [Oxycodone-Acetaminophen] Hives   I reviewed her past medical history, social history, family history, and environmental history and no significant changes have been reported from her previous visit.  Review of Systems  Constitutional:  Negative for appetite change, chills, fatigue, fever and unexpected weight change.  HENT:  Positive for congestion and postnasal drip. Negative for rhinorrhea and sneezing.   Eyes:  Negative for itching.  Respiratory:  Positive for cough. Negative for chest tightness, shortness of breath and wheezing.   Cardiovascular:  Negative for chest pain.  Gastrointestinal:  Negative for abdominal pain.  Genitourinary:  Negative for difficulty urinating.  Skin:  Negative for rash.  Allergic/Immunologic: Positive for environmental allergies. Negative for food allergies.   Objective: There were no vitals taken for this visit. There is no height or weight on file to calculate BMI. Physical Exam Vitals and nursing note reviewed.  Constitutional:      Appearance: Normal appearance. She is well-developed.  HENT:     Head: Normocephalic and atraumatic.     Right Ear: Tympanic membrane and external ear normal.     Left Ear: Tympanic membrane and external ear normal.     Nose: Congestion present.  Eyes:     Conjunctiva/sclera: Conjunctivae normal.  Cardiovascular:     Rate and Rhythm: Normal rate and regular rhythm.     Heart sounds:     No friction rub. No gallop.  Pulmonary:     Effort: Pulmonary effort is normal.     Breath sounds: Normal breath sounds. No wheezing or rales.  Musculoskeletal:     Cervical back: Neck supple.  Skin:    General: Skin is warm.     Findings: No rash.  Neurological:     Mental Status: She is alert and oriented to person, place, and time.  Psychiatric:        Behavior: Behavior normal.  Previous notes and tests were reviewed. The plan was reviewed with the patient/family, and all questions/concerned were addressed.  It was my  pleasure to see Shelley Mccoy today and participate in her care. Please feel free to contact me with any questions or concerns.  Sincerely,  Wyline Mood, DO Allergy & Immunology  [facility] office: [phone] [facility] office: [phone]

## 2022-06-08 ENCOUNTER — Ambulatory Visit: Payer: 59

## 2022-06-08 ENCOUNTER — Ambulatory Visit (INDEPENDENT_AMBULATORY_CARE_PROVIDER_SITE_OTHER): Payer: 59 | Admitting: Allergy

## 2022-06-08 ENCOUNTER — Encounter: Payer: Self-pay | Admitting: Allergy

## 2022-06-08 VITALS — BP 140/72 | HR 78 | Temp 97.4°F | Resp 16 | Ht 60.0 in | Wt 224.8 lb

## 2022-06-08 DIAGNOSIS — H1013 Acute atopic conjunctivitis, bilateral: Secondary | ICD-10-CM

## 2022-06-08 DIAGNOSIS — J4540 Moderate persistent asthma, uncomplicated: Secondary | ICD-10-CM | POA: Diagnosis not present

## 2022-06-08 DIAGNOSIS — L508 Other urticaria: Secondary | ICD-10-CM | POA: Diagnosis not present

## 2022-06-08 DIAGNOSIS — J302 Other seasonal allergic rhinitis: Secondary | ICD-10-CM

## 2022-06-08 NOTE — Assessment & Plan Note (Signed)
>>  ASSESSMENT AND PLAN FOR MODERATE PERSISTENT ASTHMA WITHOUT COMPLICATION WRITTEN ON 06/08/2022  5:11 PM BY Ellamae Sia, DO  Past history - Diagnosed with asthma 4 years ago.  2020 spirometry showed: possible restrictive disease and 21% improvement in FEV1 post bronchodilator treatment.   Interim history - not taking Advair twice a day, got her Advair and albuterol confused. No additional prednisone since last OV.  Today's spirometry showed some restriction.  Daily controller medication(s): Advair 2 puffs twice a day with spacer and rinse mouth afterwards (PURPLE INHALER).  Prior to physical activity: May use albuterol rescue inhaler 2 puffs 5 to 15 minutes prior to strenuous physical activities.  Rescue medications: May use albuterol rescue inhaler 2 puffs or nebulizer every 4 to 6 hours as needed for shortness of breath, chest tightness, coughing, and wheezing. Monitor frequency of use.   Get spirometry at next visit.

## 2022-06-08 NOTE — Patient Instructions (Addendum)
Chronic urticaria Xolair injections 300mg  every 4 weeks.   Continue with zyrtec 10mg  daily - also for allergies.  Monitor symptoms.  Avoid the following potential triggers: alcohol, tight clothing, NSAIDs.    Moderate persistent asthma  Daily controller medication(s): Advair 2 puffs twice a day with spacer and rinse mouth afterwards (PURPLE INHALER).  Prior to physical activity: May use albuterol rescue inhaler 2 puffs 5 to 15 minutes prior to strenuous physical activities. Rescue medications: May use albuterol rescue inhaler 2 puffs or nebulizer every 4 to 6 hours as needed for shortness of breath, chest tightness, coughing, and wheezing. Monitor frequency of use.  Asthma control goals:  Full participation in all desired activities (may need albuterol before activity) Albuterol use two times or less a week on average (not counting use with activity) Cough interfering with sleep two times or less a month Oral steroids no more than once a year No hospitalizations   Seasonal and perennial allergic rhinoconjunctivitis 2020 skin testing showed: Positive to ragweed and dust mites, molds and cockroaches. Continue environmental control measures. May use over the counter antihistamines such as Zyrtec (cetirizine), Claritin (loratadine), Allegra (fexofenadine), or Xyzal (levocetirizine) daily as needed. Use Flonase (fluticasone) nasal spray 1 spray per nostril twice a day as needed for nasal congestion.   Follow up in 4 months or sooner if needed.  Our [place] office is moving in September 2023 to a new location. New address: [address] [place], [place], [place] (white building). [place] office: [phone] (same phone number).

## 2022-06-08 NOTE — Assessment & Plan Note (Signed)
Past history - Rhinoconjunctivitis symptoms for the past 5 to 10 years mainly during the pollen seasons.  Using Allegra and Atrovent nasal spray with some benefit. 2020 skin testing showed: Positive to ragweed and dust mites, molds and cockroaches. Interim history - stable.   Continue environmental control measures.  May use over the counter antihistamines such as Zyrtec (cetirizine), Claritin (loratadine), Allegra (fexofenadine), or Xyzal (levocetirizine) daily as needed.  Use Flonase (fluticasone) nasal spray 1 spray per nostril twice a day as needed for nasal congestion.

## 2022-06-08 NOTE — Assessment & Plan Note (Signed)
Past history - Diagnosed with asthma 4 years ago.  2020 spirometry showed: possible restrictive disease and 21% improvement in FEV1 post bronchodilator treatment.   Interim history - not taking Advair twice a day, got her Advair and albuterol confused. No additional prednisone since last OV.  Today's spirometry showed some restriction.  Daily controller medication(s): Advair 2 puffs twice a day with spacer and rinse mouth afterwards (PURPLE INHALER).  Prior to physical activity: May use albuterol rescue inhaler 2 puffs 5 to 15 minutes prior to strenuous physical activities.  Rescue medications: May use albuterol rescue inhaler 2 puffs or nebulizer every 4 to 6 hours as needed for shortness of breath, chest tightness, coughing, and wheezing. Monitor frequency of use.   Get spirometry at next visit.

## 2022-06-08 NOTE — Assessment & Plan Note (Addendum)
Past history - Breaking out in rash/hives for the past 3 years.  Urticaria panel unremarkable. Unable to wean to every 5 weeks.  Interim history - doing well with every 4 weeks regimen.   Xolair injections 300mg  every 4 weeks - given today.  Continue with zyrtec 10mg  daily - also for allergies.   Monitor symptoms.   Avoid the following potential triggers: alcohol, tight clothing, NSAIDs.

## 2022-06-16 ENCOUNTER — Other Ambulatory Visit (HOSPITAL_COMMUNITY): Payer: Self-pay

## 2022-06-16 ENCOUNTER — Other Ambulatory Visit: Payer: Self-pay | Admitting: Obstetrics and Gynecology

## 2022-06-19 ENCOUNTER — Other Ambulatory Visit (HOSPITAL_COMMUNITY): Payer: Self-pay

## 2022-06-22 ENCOUNTER — Other Ambulatory Visit (HOSPITAL_COMMUNITY): Payer: Self-pay

## 2022-06-23 ENCOUNTER — Other Ambulatory Visit (HOSPITAL_COMMUNITY): Payer: Self-pay

## 2022-06-24 ENCOUNTER — Other Ambulatory Visit (HOSPITAL_COMMUNITY): Payer: Self-pay

## 2022-06-25 ENCOUNTER — Other Ambulatory Visit (HOSPITAL_COMMUNITY): Payer: Self-pay

## 2022-07-01 ENCOUNTER — Other Ambulatory Visit (HOSPITAL_COMMUNITY): Payer: Self-pay

## 2022-07-02 DIAGNOSIS — Z760 Encounter for issue of repeat prescription: Secondary | ICD-10-CM | POA: Diagnosis not present

## 2022-07-06 ENCOUNTER — Ambulatory Visit: Payer: 59

## 2022-07-07 ENCOUNTER — Ambulatory Visit (INDEPENDENT_AMBULATORY_CARE_PROVIDER_SITE_OTHER): Payer: 59 | Admitting: *Deleted

## 2022-07-07 ENCOUNTER — Other Ambulatory Visit (HOSPITAL_COMMUNITY): Payer: Self-pay

## 2022-07-07 DIAGNOSIS — L501 Idiopathic urticaria: Secondary | ICD-10-CM

## 2022-07-08 ENCOUNTER — Other Ambulatory Visit (HOSPITAL_COMMUNITY): Payer: Self-pay

## 2022-07-20 ENCOUNTER — Other Ambulatory Visit: Payer: Self-pay | Admitting: Allergy

## 2022-07-20 ENCOUNTER — Other Ambulatory Visit (HOSPITAL_COMMUNITY): Payer: Self-pay

## 2022-07-22 ENCOUNTER — Other Ambulatory Visit (HOSPITAL_COMMUNITY): Payer: Self-pay

## 2022-07-22 ENCOUNTER — Other Ambulatory Visit: Payer: Self-pay | Admitting: Pharmacist

## 2022-07-22 MED ORDER — XOLAIR 150 MG ~~LOC~~ SOLR
SUBCUTANEOUS | 10 refills | Status: DC
  Filled 2022-07-22: qty 2, 28d supply, fill #0
  Filled 2022-08-19: qty 2, 28d supply, fill #1
  Filled 2022-09-25: qty 2, 28d supply, fill #2
  Filled 2022-10-22: qty 2, 28d supply, fill #3
  Filled 2022-11-10 – 2022-12-10 (×2): qty 2, 28d supply, fill #4
  Filled 2023-01-07 (×2): qty 2, 28d supply, fill #5

## 2022-07-22 MED ORDER — XOLAIR 150 MG ~~LOC~~ SOLR
SUBCUTANEOUS | 10 refills | Status: DC
  Filled 2022-07-22: qty 2, fill #0

## 2022-07-24 ENCOUNTER — Other Ambulatory Visit (HOSPITAL_COMMUNITY): Payer: Self-pay

## 2022-07-30 ENCOUNTER — Other Ambulatory Visit (HOSPITAL_COMMUNITY): Payer: Self-pay

## 2022-08-04 ENCOUNTER — Ambulatory Visit: Payer: 59

## 2022-08-06 ENCOUNTER — Other Ambulatory Visit (HOSPITAL_COMMUNITY): Payer: Self-pay

## 2022-08-06 DIAGNOSIS — B009 Herpesviral infection, unspecified: Secondary | ICD-10-CM | POA: Diagnosis not present

## 2022-08-06 DIAGNOSIS — E1169 Type 2 diabetes mellitus with other specified complication: Secondary | ICD-10-CM | POA: Diagnosis not present

## 2022-08-06 DIAGNOSIS — I10 Essential (primary) hypertension: Secondary | ICD-10-CM | POA: Diagnosis not present

## 2022-08-06 DIAGNOSIS — J4520 Mild intermittent asthma, uncomplicated: Secondary | ICD-10-CM | POA: Diagnosis not present

## 2022-08-06 DIAGNOSIS — E559 Vitamin D deficiency, unspecified: Secondary | ICD-10-CM | POA: Diagnosis not present

## 2022-08-06 DIAGNOSIS — K219 Gastro-esophageal reflux disease without esophagitis: Secondary | ICD-10-CM | POA: Diagnosis not present

## 2022-08-06 DIAGNOSIS — J309 Allergic rhinitis, unspecified: Secondary | ICD-10-CM | POA: Diagnosis not present

## 2022-08-06 DIAGNOSIS — E785 Hyperlipidemia, unspecified: Secondary | ICD-10-CM | POA: Diagnosis not present

## 2022-08-06 DIAGNOSIS — F329 Major depressive disorder, single episode, unspecified: Secondary | ICD-10-CM | POA: Diagnosis not present

## 2022-08-06 MED ORDER — FAMOTIDINE 20 MG PO TABS
20.0000 mg | ORAL_TABLET | Freq: Two times a day (BID) | ORAL | 3 refills | Status: DC
  Filled 2022-08-06: qty 180, 90d supply, fill #0
  Filled 2022-11-10: qty 180, 90d supply, fill #1
  Filled 2023-05-28: qty 180, 90d supply, fill #2
  Filled 2023-07-26: qty 180, 90d supply, fill #3

## 2022-08-06 MED ORDER — FREESTYLE LITE TEST VI STRP
ORAL_STRIP | Freq: Two times a day (BID) | 3 refills | Status: AC
  Filled 2022-08-06: qty 150, 75d supply, fill #0
  Filled 2022-11-10: qty 150, 75d supply, fill #1
  Filled 2023-05-28: qty 100, 50d supply, fill #2

## 2022-08-06 MED ORDER — FLUTICASONE-SALMETEROL 250-50 MCG/ACT IN AEPB
1.0000 | INHALATION_SPRAY | Freq: Two times a day (BID) | RESPIRATORY_TRACT | 0 refills | Status: DC
  Filled 2022-08-06: qty 60, 30d supply, fill #0

## 2022-08-06 MED ORDER — ALBUTEROL SULFATE HFA 108 (90 BASE) MCG/ACT IN AERS
2.0000 | INHALATION_SPRAY | RESPIRATORY_TRACT | 3 refills | Status: DC
  Filled 2022-08-06: qty 13.4, 34d supply, fill #0
  Filled 2022-09-08: qty 13.4, 34d supply, fill #1
  Filled 2022-11-10: qty 13.4, 34d supply, fill #2
  Filled 2023-04-07: qty 13.4, 34d supply, fill #3

## 2022-08-06 MED ORDER — LOSARTAN POTASSIUM 50 MG PO TABS
50.0000 mg | ORAL_TABLET | Freq: Every day | ORAL | 3 refills | Status: DC
  Filled 2022-08-06 – 2023-05-28 (×3): qty 90, 90d supply, fill #0
  Filled 2023-07-26: qty 90, 90d supply, fill #1

## 2022-08-06 MED ORDER — ROSUVASTATIN CALCIUM 20 MG PO TABS
20.0000 mg | ORAL_TABLET | Freq: Every day | ORAL | 3 refills | Status: DC
  Filled 2022-08-06: qty 90, 90d supply, fill #0
  Filled 2022-11-10 – 2023-05-28 (×2): qty 90, 90d supply, fill #1
  Filled 2023-07-26: qty 90, 90d supply, fill #2

## 2022-08-06 MED ORDER — TRULICITY 0.75 MG/0.5ML ~~LOC~~ SOAJ
0.7500 mg | SUBCUTANEOUS | 3 refills | Status: DC
  Filled 2022-08-06: qty 2, 28d supply, fill #0
  Filled 2022-09-08: qty 2, 28d supply, fill #1
  Filled 2022-11-10: qty 2, 28d supply, fill #2
  Filled 2023-05-24: qty 2, 28d supply, fill #3

## 2022-08-06 MED ORDER — CETIRIZINE HCL 10 MG PO TABS
10.0000 mg | ORAL_TABLET | Freq: Every day | ORAL | 3 refills | Status: DC
  Filled 2022-08-06: qty 90, 90d supply, fill #0

## 2022-08-06 MED ORDER — HYDROCHLOROTHIAZIDE 12.5 MG PO TABS
12.5000 mg | ORAL_TABLET | Freq: Every day | ORAL | 3 refills | Status: DC
  Filled 2022-08-06 – 2023-02-25 (×3): qty 90, 90d supply, fill #0
  Filled 2023-05-28: qty 90, 90d supply, fill #1
  Filled 2023-07-26: qty 90, 90d supply, fill #2

## 2022-08-06 MED ORDER — GABAPENTIN 300 MG PO CAPS
300.0000 mg | ORAL_CAPSULE | Freq: Three times a day (TID) | ORAL | 3 refills | Status: DC
  Filled 2022-08-06 – 2022-11-10 (×2): qty 270, 90d supply, fill #0
  Filled 2023-04-07 (×2): qty 270, 90d supply, fill #1
  Filled 2023-07-26: qty 270, 90d supply, fill #2

## 2022-08-06 MED ORDER — SERTRALINE HCL 100 MG PO TABS
100.0000 mg | ORAL_TABLET | Freq: Every day | ORAL | 3 refills | Status: DC
  Filled 2022-08-06 – 2023-05-28 (×3): qty 90, 90d supply, fill #0
  Filled 2023-07-26: qty 90, 90d supply, fill #1

## 2022-08-06 MED ORDER — VALACYCLOVIR HCL 1 G PO TABS
1000.0000 mg | ORAL_TABLET | Freq: Every day | ORAL | 3 refills | Status: DC
  Filled 2022-08-06 – 2022-11-10 (×2): qty 90, 90d supply, fill #0

## 2022-08-06 MED ORDER — TRIAMCINOLONE ACETONIDE 0.1 % EX OINT
TOPICAL_OINTMENT | Freq: Every day | CUTANEOUS | 4 refills | Status: DC
  Filled 2022-08-06: qty 30, 30d supply, fill #0
  Filled 2022-09-08: qty 30, 30d supply, fill #1
  Filled 2022-11-10: qty 30, 30d supply, fill #2
  Filled 2023-04-28 – 2023-05-24 (×2): qty 30, 30d supply, fill #3
  Filled 2023-06-21 (×2): qty 30, 30d supply, fill #4

## 2022-08-06 MED ORDER — VITAMIN D (ERGOCALCIFEROL) 50000 UNITS PO CAPS
50000.0000 [IU] | ORAL_CAPSULE | ORAL | 3 refills | Status: DC
  Filled 2022-08-06: qty 12, 84d supply, fill #0
  Filled 2022-11-10: qty 12, 84d supply, fill #1

## 2022-08-07 ENCOUNTER — Other Ambulatory Visit (HOSPITAL_COMMUNITY): Payer: Self-pay

## 2022-08-10 ENCOUNTER — Ambulatory Visit (INDEPENDENT_AMBULATORY_CARE_PROVIDER_SITE_OTHER): Payer: 59

## 2022-08-10 DIAGNOSIS — L501 Idiopathic urticaria: Secondary | ICD-10-CM

## 2022-08-18 ENCOUNTER — Telehealth: Payer: Self-pay | Admitting: Pharmacist

## 2022-08-18 NOTE — Telephone Encounter (Signed)
Called patient to schedule an appointment for the [facility]. I was unable to reach the patient so I left a HIPAA-compliant message requesting that the patient return my call.   Luke Van Ausdall, PharmD, BCACP, CPP Clinical Pharmacist [facility] [phone]  

## 2022-08-19 ENCOUNTER — Other Ambulatory Visit (HOSPITAL_COMMUNITY): Payer: Self-pay

## 2022-08-20 DIAGNOSIS — R6883 Chills (without fever): Secondary | ICD-10-CM | POA: Diagnosis not present

## 2022-08-20 DIAGNOSIS — J069 Acute upper respiratory infection, unspecified: Secondary | ICD-10-CM | POA: Diagnosis not present

## 2022-08-20 DIAGNOSIS — Z20822 Contact with and (suspected) exposure to covid-19: Secondary | ICD-10-CM | POA: Diagnosis not present

## 2022-09-02 ENCOUNTER — Other Ambulatory Visit (HOSPITAL_COMMUNITY): Payer: Self-pay

## 2022-09-03 ENCOUNTER — Other Ambulatory Visit (HOSPITAL_COMMUNITY): Payer: Self-pay

## 2022-09-07 ENCOUNTER — Ambulatory Visit (INDEPENDENT_AMBULATORY_CARE_PROVIDER_SITE_OTHER): Payer: 59

## 2022-09-07 DIAGNOSIS — L501 Idiopathic urticaria: Secondary | ICD-10-CM

## 2022-09-08 ENCOUNTER — Other Ambulatory Visit (HOSPITAL_COMMUNITY): Payer: Self-pay

## 2022-09-08 ENCOUNTER — Other Ambulatory Visit: Payer: Self-pay

## 2022-09-08 ENCOUNTER — Ambulatory Visit
Admission: RE | Admit: 2022-09-08 | Discharge: 2022-09-08 | Disposition: A | Discharge status: Home / Self Care | Payer: 59 | Source: Ambulatory Visit

## 2022-09-08 DIAGNOSIS — Z1231 Encounter for screening mammogram for malignant neoplasm of breast: Secondary | ICD-10-CM | POA: Diagnosis not present

## 2022-09-10 ENCOUNTER — Telehealth: Payer: Self-pay | Admitting: Pharmacist

## 2022-09-10 ENCOUNTER — Other Ambulatory Visit: Payer: Self-pay

## 2022-09-10 ENCOUNTER — Other Ambulatory Visit (HOSPITAL_COMMUNITY): Payer: Self-pay

## 2022-09-10 NOTE — Telephone Encounter (Signed)
Called patient to schedule an appointment for the [facility]. I was unable to reach the patient so I left a HIPAA-compliant message requesting that the patient return my call.   Luke Van Ausdall, PharmD, BCACP, CPP Clinical Pharmacist [facility] [phone]  

## 2022-09-11 ENCOUNTER — Other Ambulatory Visit (HOSPITAL_COMMUNITY): Payer: Self-pay

## 2022-09-15 ENCOUNTER — Other Ambulatory Visit (HOSPITAL_COMMUNITY): Payer: Self-pay

## 2022-09-15 MED ORDER — FLUTICASONE-SALMETEROL 250-50 MCG/ACT IN AEPB
1.0000 | INHALATION_SPRAY | Freq: Two times a day (BID) | RESPIRATORY_TRACT | 3 refills | Status: DC
  Filled 2022-09-15 – 2022-11-10 (×2): qty 60, 30d supply, fill #0
  Filled 2023-04-07 (×3): qty 60, 30d supply, fill #1

## 2022-09-17 ENCOUNTER — Other Ambulatory Visit (HOSPITAL_COMMUNITY): Payer: Self-pay

## 2022-09-24 ENCOUNTER — Other Ambulatory Visit (HOSPITAL_COMMUNITY): Payer: Self-pay

## 2022-09-25 ENCOUNTER — Other Ambulatory Visit (HOSPITAL_COMMUNITY): Payer: Self-pay

## 2022-09-29 ENCOUNTER — Other Ambulatory Visit: Payer: Self-pay

## 2022-09-29 ENCOUNTER — Other Ambulatory Visit (HOSPITAL_COMMUNITY): Payer: Self-pay

## 2022-09-29 DIAGNOSIS — R059 Cough, unspecified: Secondary | ICD-10-CM | POA: Diagnosis not present

## 2022-09-29 DIAGNOSIS — J069 Acute upper respiratory infection, unspecified: Secondary | ICD-10-CM | POA: Diagnosis not present

## 2022-09-30 ENCOUNTER — Other Ambulatory Visit: Payer: Self-pay

## 2022-10-02 ENCOUNTER — Other Ambulatory Visit: Payer: Self-pay

## 2022-10-02 ENCOUNTER — Other Ambulatory Visit (HOSPITAL_COMMUNITY): Payer: Self-pay

## 2022-10-02 ENCOUNTER — Ambulatory Visit (INDEPENDENT_AMBULATORY_CARE_PROVIDER_SITE_OTHER): Payer: 59 | Admitting: Allergy

## 2022-10-02 ENCOUNTER — Encounter: Payer: Self-pay | Admitting: Allergy

## 2022-10-02 VITALS — BP 146/88 | HR 97 | Temp 98.2°F | Resp 20 | Ht 60.0 in | Wt 223.7 lb

## 2022-10-02 DIAGNOSIS — H1010 Acute atopic conjunctivitis, unspecified eye: Secondary | ICD-10-CM

## 2022-10-02 DIAGNOSIS — J988 Other specified respiratory disorders: Secondary | ICD-10-CM | POA: Diagnosis not present

## 2022-10-02 DIAGNOSIS — J45998 Other asthma: Secondary | ICD-10-CM | POA: Diagnosis not present

## 2022-10-02 DIAGNOSIS — J4541 Moderate persistent asthma with (acute) exacerbation: Secondary | ICD-10-CM | POA: Diagnosis not present

## 2022-10-02 DIAGNOSIS — L501 Idiopathic urticaria: Secondary | ICD-10-CM

## 2022-10-02 DIAGNOSIS — J4550 Severe persistent asthma, uncomplicated: Secondary | ICD-10-CM | POA: Diagnosis not present

## 2022-10-02 DIAGNOSIS — J302 Other seasonal allergic rhinitis: Secondary | ICD-10-CM

## 2022-10-02 DIAGNOSIS — H1013 Acute atopic conjunctivitis, bilateral: Secondary | ICD-10-CM

## 2022-10-02 MED ORDER — PREDNISONE 10 MG PO TABS
ORAL_TABLET | ORAL | 0 refills | Status: AC
  Filled 2022-10-02: qty 20, 8d supply, fill #0

## 2022-10-02 MED ORDER — BUDESONIDE 0.5 MG/2ML IN SUSP
0.5000 mg | Freq: Two times a day (BID) | RESPIRATORY_TRACT | 2 refills | Status: DC
  Filled 2022-10-02: qty 120, 30d supply, fill #0
  Filled 2022-11-10: qty 120, 30d supply, fill #1
  Filled 2023-05-24: qty 120, 30d supply, fill #2

## 2022-10-02 MED ORDER — METHYLPREDNISOLONE ACETATE 80 MG/ML IJ SUSP
80.0000 mg | Freq: Once | INTRAMUSCULAR | Status: AC
  Administered 2022-10-02: 80 mg via INTRAMUSCULAR

## 2022-10-02 MED ORDER — ALBUTEROL SULFATE (2.5 MG/3ML) 0.083% IN NEBU
2.5000 mg | INHALATION_SOLUTION | RESPIRATORY_TRACT | 1 refills | Status: DC | PRN
  Filled 2022-10-02: qty 90, 5d supply, fill #0
  Filled 2022-11-10: qty 90, 5d supply, fill #1

## 2022-10-02 NOTE — Patient Instructions (Addendum)
Respiratory infection Take prednisone 40mg  daily x 2 days, 30mg  daily x 2 days, 20mg  daily x 2 days and 10mg  daily x 2 days. Monitor your blood sugar while taking the prednisone. May take tessalon perles as prescribed for the coughing as needed. Finish z-pak as prescribed.  See below for symptomatic management.   Moderate persistent asthma with exacerbation. Steroid shot given today. Daily controller medication(s): Advair 22mcg 2 puffs twice a day with spacer and rinse mouth afterwards (PURPLE INHALER). During respiratory infections/flares:  Start Pulmicort 0.5mg  nebulizer twice a day for 1-2 weeks until your breathing symptoms return to baseline.  Pretreat with albuterol 2 puffs or albuterol nebulizer.  If you need to use your albuterol nebulizer machine back to back within 15-30 minutes with no relief then please go to the ER/urgent care for further evaluation.  Breathing control goals:  Full participation in all desired activities (may need albuterol before activity) Albuterol use two times or less a week on average (not counting use with activity) Cough interfering with sleep two times or less a month Oral steroids no more than once a year No hospitalizations   Chronic urticaria Xolair injections 300mg  every 4 weeks - given today. Continue with zyrtec 10mg  daily - also for allergies.  Monitor symptoms.  Avoid the following potential triggers: alcohol, tight clothing, NSAIDs.    Seasonal and perennial allergic rhinoconjunctivitis 2020 skin testing showed: Positive to ragweed and dust mites, molds and cockroaches. Continue environmental control measures. May use over the counter antihistamines such as Zyrtec (cetirizine), Claritin (loratadine), Allegra (fexofenadine), or Xyzal (levocetirizine) daily as needed. Use Flonase (fluticasone) nasal spray 1 spray per nostril twice a day as needed for nasal congestion.   Follow up in 2 months or sooner if needed.  Drink plenty of  fluids. Water, juice, clear broth or warm lemon water are good choices. Avoid caffeine and alcohol, which can dehydrate you. Eat chicken soup. Chicken soup and other warm fluids can be soothing and loosen congestion. Rest. Adjust your room's temperature and humidity. Keep your room warm but not overheated. If the air is dry, a cool-mist humidifier or vaporizer can moisten the air and help ease congestion and coughing. Keep the humidifier clean to prevent the growth of bacteria and molds. Soothe your throat. Perform a saltwater gargle. Dissolve one-quarter to a half teaspoon of salt in a 4- to 8-ounce glass of warm water. This can relieve a sore or scratchy throat temporarily. Use saline nasal drops. To help relieve nasal congestion, try saline nasal drops. You can buy these drops over the counter, and they can help relieve symptoms ? even in children. Take over-the-counter cold and cough medications. For adults and children older than 5, over-the-counter decongestants, antihistamines and pain relievers might offer some symptom relief. However, they won't prevent a cold or shorten its duration.

## 2022-10-02 NOTE — Assessment & Plan Note (Signed)
Past history - Diagnosed with asthma 4 years ago.  2020 spirometry showed: possible restrictive disease and 21% improvement in FEV1 post bronchodilator treatment.   Interim history - coughing with post tussive emesis with current respiratory infection. Coughing much improved after duoneb tx in the office.  Steroid shot given today. Start prednisone taper as above.  Daily controller medication(s): Advair 228mcg 2 puffs twice a day with spacer and rinse mouth afterwards (PURPLE INHALER). Nebulizer machine given and demonstrated proper use. During respiratory infections/flares:  Start Pulmicort 0.5mg  nebulizer twice a day for 1-2 weeks until your breathing symptoms return to baseline.  Pretreat with albuterol 2 puffs or albuterol nebulizer.  If you need to use your albuterol nebulizer machine back to back within 15-30 minutes with no relief then please go to the ER/urgent care for further evaluation.  Get spirometry at next visit.

## 2022-10-02 NOTE — Assessment & Plan Note (Signed)
Past history - Rhinoconjunctivitis symptoms for the past 5 to 10 years mainly during the pollen seasons.  Using Allegra and Atrovent nasal spray with some benefit. 2020 skin testing showed: Positive to ragweed and dust mites, molds and cockroaches. Interim history - controlled.  Continue environmental control measures. May use over the counter antihistamines such as Zyrtec (cetirizine), Claritin (loratadine), Allegra (fexofenadine), or Xyzal (levocetirizine) daily as needed. Use Flonase (fluticasone) nasal spray 1 spray per nostril twice a day as needed for nasal congestion.

## 2022-10-02 NOTE — Assessment & Plan Note (Signed)
Feeling sick x 2 weeks with coughing. Negative covid-19 and flu test earlier this week. UC prescribed tessalon perles and zpak but not helping.  She most likely has some type of respiratory infection which is also flaring her asthma.  Steroid injection given today - Depo 80mg .  Take prednisone 40mg  daily x 2 days, 30mg  daily x 2 days, 20mg  daily x 2 days and 10mg  daily x 2 days. Monitor your blood sugar while taking the prednisone. May take tessalon perles as prescribed for the coughing as needed. Finish z-pak as prescribed.  See below for symptomatic management.

## 2022-10-02 NOTE — Progress Notes (Signed)
Follow Up Note  RE: Shelley Mccoy MRN: [id number] DOB: 1971/04/17 Date of Office Visit: 10/02/2022  Referring provider: Tylene Fantasia, * Primary care provider: Tylene Fantasia, MD  Chief Complaint: Nasal Congestion and Cough (Pt c/o sore throat x 2 weeks would like left ear checked.)  History of Present Illness: I had the pleasure of seeing Shelley Mccoy for a follow up visit at the [facility] on 10/02/2022. She is a 52 y.o. female, who is being followed for asthma, CIU on Xolair, allergic rhinoconjunctivitis. Her previous allergy office visit was on 06/08/2022 with Dr. Selena Batten. Today is a new complaint visit of not feeling well .  Patient has been having a cough for the past 2 weeks.  She is coughing up mucous which is brown in color at times. Today it was yellowish in the office.  No fevers.  She tested negative for Covid-19 and flu earlier this week which was negative.  She was started on tessalon perles and zpak however she is having a hard time keeping the medications down as she is coughing so much that it makes her throw up.  No recent prednisone.   Still taking Advair 2 puffs twice a day.  No nebulizer machine at home.   Chronic urticaria Stable with Xolair injections 300mg  every 4 weeks. Wants to get them today if possible.  Seasonal and perennial allergic rhinoconjunctivitis Controlled.   Assessment and Plan: Shelley Mccoy is a 52 y.o. female with: Respiratory infection Feeling sick x 2 weeks with coughing. Negative covid-19 and flu test earlier this week. UC prescribed tessalon perles and zpak but not helping.  She most likely has some type of respiratory infection which is also flaring her asthma.  Steroid injection given today - Depo 80mg .  Take prednisone 40mg  daily x 2 days, 30mg  daily x 2 days, 20mg  daily x 2 days and 10mg  daily x 2 days. Monitor your blood sugar while taking the prednisone. May take tessalon perles as  prescribed for the coughing as needed. Finish z-pak as prescribed.  See below for symptomatic management.   Moderate persistent asthma with (acute) exacerbation Past history - Diagnosed with asthma 4 years ago.  2020 spirometry showed: possible restrictive disease and 21% improvement in FEV1 post bronchodilator treatment.   Interim history - coughing with post tussive emesis with current respiratory infection. Coughing much improved after duoneb tx in the office.  Steroid shot given today. Start prednisone taper as above.  Daily controller medication(s): Advair 40 2 puffs twice a day with spacer and rinse mouth afterwards (PURPLE INHALER). Nebulizer machine given and demonstrated proper use. During respiratory infections/flares:  Start Pulmicort 0.5mg  nebulizer twice a day for 1-2 weeks until your breathing symptoms return to baseline.  Pretreat with albuterol 2 puffs or albuterol nebulizer.  If you need to use your albuterol nebulizer machine back to back within 15-30 minutes with no relief then please go to the ER/urgent care for further evaluation.  Get spirometry at next visit.   Chronic idiopathic urticaria Past history - Breaking out in rash/hives for the past 3 years.  Urticaria panel unremarkable. Unable to wean to every 5 weeks.  Interim history - doing well with no flares.  Xolair injections 300mg  every 4 weeks - given today. Continue with zyrtec 10mg  daily - also for allergies.  Monitor symptoms.  Avoid the following potential triggers: alcohol, tight clothing, NSAIDs.   Seasonal and perennial allergic rhinoconjunctivitis Past history - Rhinoconjunctivitis symptoms for the past  5 to 10 years mainly during the pollen seasons.  Using Allegra and Atrovent nasal spray with some benefit. 2020 skin testing showed: Positive to ragweed and dust mites, molds and cockroaches. Interim history - controlled.  Continue environmental control measures. May use over the counter  antihistamines such as Zyrtec (cetirizine), Claritin (loratadine), Allegra (fexofenadine), or Xyzal (levocetirizine) daily as needed. Use Flonase (fluticasone) nasal spray 1 spray per nostril twice a day as needed for nasal congestion.  Return in about 2 months (around 12/01/2022).  Meds ordered this encounter  Medications   methylPREDNISolone acetate (DEPO-MEDROL) injection 80 mg   albuterol (PROVENTIL) (2.5 MG/3ML) 0.083% nebulizer solution    Sig: Take 3 mLs (2.5 mg total) by nebulization every 4 (four) hours as needed for wheezing or shortness of breath (coughing fits).    Dispense:  90 mL    Refill:  1   budesonide (PULMICORT) 0.5 MG/2ML nebulizer solution    Sig: Take 2 mLs (0.5 mg total) by nebulization in the morning and at bedtime. Take for 1-2 weeks during asthma flare.    Dispense:  120 mL    Refill:  2   predniSONE (DELTASONE) 10 MG tablet    Sig: Take 4 tablets daily for 2 days-- THEN take 3 tablets daily for 2 days-- THEN take 2 tablets daily for 2 days-- THEN take 1 tablet daily for 2 days.    Dispense:  20 tablet    Refill:  0   Lab Orders  No laboratory test(s) ordered today    Diagnostics: None.   Medication List:  Current Outpatient Medications  Medication Sig Dispense Refill   albuterol (PROVENTIL) (2.5 MG/3ML) 0.083% nebulizer solution Take 3 mLs (2.5 mg total) by nebulization every 4 (four) hours as needed for wheezing or shortness of breath (coughing fits). 90 mL 1   albuterol (VENTOLIN HFA) 108 (90 Base) MCG/ACT inhaler Inhale 2 puffs into the lungs every 4 (four) hours. 13.4 g 3   Ascorbic Acid (VITAMIN C PO) Take 1 tablet by mouth daily.     budesonide (PULMICORT) 0.5 MG/2ML nebulizer solution Take 2 mLs (0.5 mg total) by nebulization in the morning and at bedtime. Take for 1-2 weeks during asthma flare. 120 mL 2   cetirizine (ZYRTEC) 10 MG tablet Take 1 tablet (10 mg total) by mouth daily. 90 tablet 3   cyclobenzaprine (FLEXERIL) 10 MG tablet Take 10 mg by  mouth as needed.     Dulaglutide (TRULICITY) 0.75 MG/0.5ML SOPN Inject 0.75 mg into the skin once a week. 2 mL 3   EPINEPHrine 0.3 mg/0.3 mL IJ SOAJ injection Inject 0.3 mg into the muscle as needed. 2 each 4   estradiol (ESTRACE VAGINAL) 0.1 MG/GM vaginal cream Place 1 Applicatorful (4grams) vaginally daily for 2 weeks, then decrease to 2 grams vaginally daily for 2 weeks, then 1 gram vaginally 2 to 3 times weekly. 42.5 g 2   famotidine (PEPCID) 20 MG tablet Take 1 tablet (20 mg total) by mouth 2 (two) times daily. 180 tablet 3   Ferrous Sulfate (IRON PO) Take 1 tablet by mouth daily.     fluticasone (FLONASE) 50 MCG/ACT nasal spray Place 2 sprays into both nostrils daily. 16 g 3   fluticasone-salmeterol (ADVAIR HFA) 230-21 MCG/ACT inhaler Inhale 2 puffs into the lungs 2 (two) times daily. 12 g 6   fluticasone-salmeterol (ADVAIR) 250-50 MCG/ACT AEPB Inhale 1 puff into the lungs 2 (two) times daily. 60 each 3   gabapentin (NEURONTIN) 300 MG capsule  Take 1 capsule (300 mg total) by mouth 3 (three) times daily. 270 capsule 3   gabapentin (NEURONTIN) 300 MG capsule Take 1 capsule (300 mg total) by mouth 3 (three) times daily. 270 capsule 3   glipiZIDE (GLUCOTROL) 10 MG tablet Take 10 mg by mouth daily.     glucose blood (FREESTYLE LITE) test strip Use 1 strip 2 (two) times daily. 150 strip 3   hydrochlorothiazide (HYDRODIURIL) 12.5 MG tablet Take 1 tablet (12.5 mg total) by mouth daily. 90 tablet 3   hydrochlorothiazide (HYDRODIURIL) 12.5 MG tablet Take 1 tablet (12.5 mg total) by mouth daily. 90 tablet 3   ibuprofen (ADVIL) 800 MG tablet Take 800 mg by mouth 3 (three) times daily.     insulin degludec (TRESIBA) 100 UNIT/ML FlexTouch Pen Inject 10 Units into the skin daily.     losartan (COZAAR) 50 MG tablet Take 1 tablet (50 mg total) by mouth daily. 90 tablet 3   losartan (COZAAR) 50 MG tablet Take 1 tablet (50 mg total) by mouth daily. 90 tablet 3   omalizumab (XOLAIR) 150 MG injection INJECT 300  MG SUBCUTANEOUSLY EVERY 4 WEEKS. 2 each 10   Omega-3 Fatty Acids (FISH OIL) 1000 MG CAPS Take by mouth.     pantoprazole (PROTONIX) 40 MG tablet Take by mouth.     predniSONE (DELTASONE) 10 MG tablet Take 4 tablets daily for 2 days-- THEN take 3 tablets daily for 2 days-- THEN take 2 tablets daily for 2 days-- THEN take 1 tablet daily for 2 days. 20 tablet 0   rosuvastatin (CRESTOR) 20 MG tablet Take 1 tablet (20 mg total) by mouth daily. 90 tablet 3   rosuvastatin (CRESTOR) 20 MG tablet Take 1 tablet (20 mg total) by mouth daily. 90 tablet 3   sertraline (ZOLOFT) 100 MG tablet Take 1 tablet (100 mg total) by mouth daily. 90 tablet 3   sertraline (ZOLOFT) 100 MG tablet Take 1 tablet (100 mg total) by mouth daily. 90 tablet 3   topiramate (TOPAMAX) 25 MG tablet      triamcinolone ointment (KENALOG) 0.1 % Apply 1 gram to affected area topically once daily. 30 g 4   valACYclovir (VALTREX) 1000 MG tablet Take 1 tablet (1,000 mg total) by mouth daily. 90 tablet 3   valACYclovir (VALTREX) 1000 MG tablet Take 1 tablet (1,000 mg total) by mouth daily. 90 tablet 3   Vitamin D, Ergocalciferol, (DRISDOL) 1.25 MG (50000 UNIT) CAPS capsule Take 1 capsule (50,000 Units total) by mouth once a week. 13 capsule 3   Vitamin D, Ergocalciferol, 50000 units CAPS Take 50,000 Units by mouth once a week. 13 capsule 3   Current Facility-Administered Medications  Medication Dose Route Frequency Provider Last Rate Last Admin   omalizumab Arvid Right) injection 300 mg  300 mg Subcutaneous Q28 days Valentina Shaggy, MD   300 mg at 10/02/22 1628   Allergies: Allergies  Allergen Reactions   Percocet [Oxycodone-Acetaminophen] Hives   I reviewed her past medical history, social history, family history, and environmental history and no significant changes have been reported from her previous visit.  Review of Systems  Constitutional:  Positive for fatigue. Negative for appetite change, chills, fever and unexpected weight  change.  HENT:  Positive for congestion and sore throat.   Eyes:  Negative for itching.  Respiratory:  Positive for cough, chest tightness, shortness of breath and wheezing.   Cardiovascular:  Negative for chest pain.  Gastrointestinal:  Negative for abdominal pain.  Genitourinary:  Negative for difficulty urinating.  Skin:  Negative for rash.  Allergic/Immunologic: Positive for environmental allergies. Negative for food allergies.    Objective: BP (!) 146/88 (BP Location: Right Arm, Patient Position: Sitting, Cuff Size: Large)   Pulse 97   Temp 98.2 F (36.8 C) (Temporal)   Resp 20   Ht 5' (1.524 m)   Wt 223 lb 11.2 oz (101.5 kg)   SpO2 97%   BMI 43.69 kg/m  Body mass index is 43.69 kg/m. Physical Exam Vitals and nursing note reviewed.  Constitutional:      Appearance: Normal appearance. She is well-developed.  HENT:     Head: Normocephalic and atraumatic.     Right Ear: Tympanic membrane and external ear normal.     Left Ear: Tympanic membrane and external ear normal.     Nose: Nose normal.  Eyes:     Conjunctiva/sclera: Conjunctivae normal.  Cardiovascular:     Rate and Rhythm: Normal rate and regular rhythm.     Heart sounds:     No friction rub. No gallop.  Pulmonary:     Effort: Pulmonary effort is normal.     Breath sounds: No wheezing or rales.     Comments: Coughing during exam. Musculoskeletal:     Cervical back: Neck supple.  Skin:    General: Skin is warm.     Findings: No rash.  Neurological:     Mental Status: She is alert and oriented to person, place, and time.  Psychiatric:        Behavior: Behavior normal.    Previous notes and tests were reviewed. The plan was reviewed with the patient/family, and all questions/concerned were addressed.  It was my pleasure to see Shelley Mccoy today and participate in her care. Please feel free to contact me with any questions or concerns.  Sincerely,  Rexene Alberts, DO Allergy & Immunology  [facility] office: [place] office: [phone]

## 2022-10-02 NOTE — Assessment & Plan Note (Signed)
Past history - Breaking out in rash/hives for the past 3 years.  Urticaria panel unremarkable. Unable to wean to every 5 weeks.  Interim history - doing well with no flares.  Xolair injections 300mg  every 4 weeks - given today. Continue with zyrtec 10mg  daily - also for allergies.  Monitor symptoms.  Avoid the following potential triggers: alcohol, tight clothing, NSAIDs.

## 2022-10-05 ENCOUNTER — Ambulatory Visit: Payer: Self-pay

## 2022-10-07 ENCOUNTER — Other Ambulatory Visit (HOSPITAL_COMMUNITY): Payer: Self-pay

## 2022-10-09 ENCOUNTER — Other Ambulatory Visit (HOSPITAL_COMMUNITY): Payer: Self-pay

## 2022-10-22 ENCOUNTER — Other Ambulatory Visit (HOSPITAL_COMMUNITY): Payer: Self-pay

## 2022-10-29 ENCOUNTER — Ambulatory Visit: Payer: Self-pay

## 2022-11-10 ENCOUNTER — Other Ambulatory Visit (HOSPITAL_COMMUNITY): Payer: Self-pay

## 2022-11-10 ENCOUNTER — Other Ambulatory Visit: Payer: Self-pay

## 2022-11-10 ENCOUNTER — Other Ambulatory Visit: Payer: Self-pay | Admitting: Obstetrics and Gynecology

## 2022-11-11 ENCOUNTER — Other Ambulatory Visit (HOSPITAL_COMMUNITY): Payer: Self-pay

## 2022-11-11 ENCOUNTER — Other Ambulatory Visit: Payer: Self-pay

## 2022-11-12 ENCOUNTER — Other Ambulatory Visit (HOSPITAL_COMMUNITY): Payer: Self-pay

## 2022-11-12 ENCOUNTER — Other Ambulatory Visit: Payer: Self-pay

## 2022-11-13 ENCOUNTER — Other Ambulatory Visit (HOSPITAL_COMMUNITY): Payer: Self-pay

## 2022-11-13 MED ORDER — EPINEPHRINE 0.3 MG/0.3ML IJ SOAJ
0.3000 mg | INTRAMUSCULAR | 4 refills | Status: DC | PRN
  Filled 2022-11-13: qty 2, 2d supply, fill #0

## 2022-11-18 ENCOUNTER — Other Ambulatory Visit (HOSPITAL_COMMUNITY): Payer: Self-pay

## 2022-11-19 ENCOUNTER — Ambulatory Visit: Payer: 59

## 2022-11-19 ENCOUNTER — Encounter: Payer: Self-pay | Admitting: Family Medicine

## 2022-11-19 ENCOUNTER — Encounter: Payer: Self-pay | Admitting: Allergy & Immunology

## 2022-11-19 ENCOUNTER — Telehealth: Payer: Self-pay

## 2022-11-19 ENCOUNTER — Other Ambulatory Visit (HOSPITAL_COMMUNITY): Payer: Self-pay

## 2022-11-19 ENCOUNTER — Ambulatory Visit (INDEPENDENT_AMBULATORY_CARE_PROVIDER_SITE_OTHER): Payer: 59 | Admitting: Family Medicine

## 2022-11-19 VITALS — BP 122/82 | HR 92 | Temp 99.0°F | Resp 15

## 2022-11-19 DIAGNOSIS — L508 Other urticaria: Secondary | ICD-10-CM | POA: Diagnosis not present

## 2022-11-19 DIAGNOSIS — R110 Nausea: Secondary | ICD-10-CM | POA: Diagnosis not present

## 2022-11-19 MED ORDER — ONDANSETRON HCL 4 MG PO TABS
4.0000 mg | ORAL_TABLET | Freq: Three times a day (TID) | ORAL | 0 refills | Status: DC | PRN
  Filled 2022-11-19: qty 20, 7d supply, fill #0

## 2022-11-19 NOTE — Telephone Encounter (Signed)
Called and spoke to patient and she expressed that she went to Urgent care and they informed her that she is feeling the way she is feeling due to the anesthesia from her oral procedure from yesterday. She states she is feeling slightly better. Patient was informed to call our office with any issues or concerns she may have, patient verbalized understanding.

## 2022-11-19 NOTE — Patient Instructions (Addendum)
Nausea Begin Zofran 4 mg once every 8 hours as needed for nausea Continue to hydrate as tolerated. Contact your dental provider and primary care provider regarding nausea Check your blood sugar when you get home Call the clinic or call 911 for any worsening of symptoms  Chronic urticaria Continue Xolair injections and have access to an epipen per protocol   Call the clinic if this treatment plan is not working well for you  Follow up in 1 week or sooner if needed.

## 2022-11-19 NOTE — Progress Notes (Signed)
[place] [address] Dept: [phone]  FOLLOW UP NOTE  Patient ID: Shelley Mccoy, female    DOB: [birth year]  Age: 52 y.o. MRN: [id number] Date of Office Visit: 11/19/2022  Assessment  Chief Complaint: Nausea  HPI Shelley Mccoy is a 52 year old female who presents to the clinic for a Xolair injection today.  She is noted in the waiting room to be extremely tired, borderline lethargic.  She was last seen in this clinic on 10/02/2022 by Dr. Maudie Mercury for evaluation of nasal congestion and cough.  A few days before that appointment, she had been started on azithromycin and reported at that time that she was experiencing nausea and vomiting.  She was treated with a steroid injection as well as prednisone taper at that time.  In the interim, she reports that she had to wisdom teeth pulled yesterday and has started on a penicillin antibiotic yesterday as well as ibuprofen for pain control.  Today she reports that she feels dizzy, lethargic, and nauseated.  She denies shortness of breath, cough, or wheeze with activity or rest.  She reports that she has not eaten over the last 2 days.  She denies recent fever, sweats, chills, or sick contacts.  She does take Trulicity and glipizide for type 2 diabetes. She does report some nausea, however, denies abdominal pain or diarrhea. She denies hives or itch. She has taken antibiotics containing penicillin previously with no adverse reaction. She is currently taking ibuprofen for pain and reports that she has taken ibuprofen previously without issues.   Patient reports that she does not have anyone who can come to the clinic to pick her up and take her home. She is interested in driving home to rest. She is not interested in going to the ED or urgent care at this time. She was given apple juice 10 oz consumed while in the clinic and 10 oz apple juice given for the ride home. Vital signs within normal limits upon discharge. Patient reports she will  call the clinic when she arrives home.   Patient answered her phone at 12:00 reporting that she had just arrived home a few minutes prior to that time. She reports that she is feeling the same as when she was at the clinic. She is not interested in going to the ED at this time, however, is going to check her FSBS and call back. About 15 minutes later she answered her phone and reports her FSBS is 89. She agrees to go the ED, however, she is not interested in a 911 call or ambulance ride and rather prefers to wait for her relative to pick her up and take her to the ED. She first reports that she will go in at 3:00 and then reports that her relative is currently en route to pick her up and take her to the ED.    Drug Allergies:  Allergies  Allergen Reactions   Percocet [Oxycodone-Acetaminophen] Hives    Physical Exam: BP 122/82   Pulse 92   Temp 99 F (37.2 C) (Temporal)   Resp 15   SpO2 99%    Physical Exam Constitutional:      Comments: Patient appears ill with slow actions and short answers. Answers all questions appropriately.   HENT:     Head: Normocephalic and atraumatic.     Right Ear: Tympanic membrane normal.     Left Ear: Tympanic membrane normal.     Mouth/Throat:  Pharynx: Oropharynx is clear.     Comments: Top molars removed bilateral upper jaw. Some redness at each site. No purulent drainage noted. No throat swelling.  Eyes:     Conjunctiva/sclera: Conjunctivae normal.  Cardiovascular:     Rate and Rhythm: Normal rate and regular rhythm.     Heart sounds: Normal heart sounds. No murmur heard. Pulmonary:     Effort: Pulmonary effort is normal.     Breath sounds: Normal breath sounds.     Comments: Lung sounds clear to auscultation Musculoskeletal:     Cervical back: Normal range of motion and neck supple.  Neurological:     Comments: Oriented X3 with slow answers. Movements lethargic. Closing her eyes to rest frequently  Psychiatric:        Mood and Affect:  Mood normal.        Behavior: Behavior normal.        Thought Content: Thought content normal.        Judgment: Judgment normal.       Assessment and Plan: 1. Nausea   2. Chronic urticaria     Meds ordered this encounter  Medications   ondansetron (ZOFRAN) 4 MG tablet    Sig: Take 1 tablet (4 mg total) by mouth every 8 (eight) hours as needed for nausea or vomiting.    Dispense:  20 tablet    Refill:  0    Patient Instructions  Nausea Begin Zofran 4 mg once every 8 hours as needed for nausea Continue to hydrate as tolerated. Contact your dental provider and primary care provider regarding nausea Check your blood sugar when you get home Call the clinic or call 911 for any worsening of symptoms  Chronic urticaria Continue Xolair injections and have access to an epipen per protocol   Call the clinic if this treatment plan is not working well for you  Follow up in 1 week or sooner if needed.  No follow-ups on file.    Thank you for the opportunity to care for this patient.  Please do not hesitate to contact me with questions.  Gareth Morgan, FNP [facility]

## 2022-11-19 NOTE — Telephone Encounter (Signed)
-----   Message from [place], MD sent at 11/19/2022  6:00 PM EST ----- Regarding: check in Before late clinic ends can someone please check on this pt Shelley Mccoy saw earlier today who did not look great coming for her injection.   See what her disposition is--- if family member arrived to take to ED or if she has been able to eat something and feeling better? I do believe she should call her dental provider to let them know how she is feeling after the dental procedure.

## 2022-11-19 NOTE — Telephone Encounter (Signed)
Called patient - DOB verified - patient has been contacted already.   Please refer to earlier telephone encounter.

## 2022-11-20 ENCOUNTER — Other Ambulatory Visit (HOSPITAL_COMMUNITY): Payer: Self-pay

## 2022-11-20 DIAGNOSIS — Z111 Encounter for screening for respiratory tuberculosis: Secondary | ICD-10-CM | POA: Diagnosis not present

## 2022-11-22 DIAGNOSIS — Z0279 Encounter for issue of other medical certificate: Secondary | ICD-10-CM | POA: Diagnosis not present

## 2022-11-23 ENCOUNTER — Ambulatory Visit (INDEPENDENT_AMBULATORY_CARE_PROVIDER_SITE_OTHER): Payer: 59 | Admitting: *Deleted

## 2022-11-23 ENCOUNTER — Other Ambulatory Visit (HOSPITAL_COMMUNITY): Payer: Self-pay

## 2022-11-23 DIAGNOSIS — L501 Idiopathic urticaria: Secondary | ICD-10-CM

## 2022-12-08 ENCOUNTER — Other Ambulatory Visit (HOSPITAL_COMMUNITY): Payer: Self-pay

## 2022-12-10 ENCOUNTER — Other Ambulatory Visit (HOSPITAL_COMMUNITY): Payer: Self-pay

## 2022-12-11 ENCOUNTER — Other Ambulatory Visit: Payer: Self-pay

## 2022-12-13 ENCOUNTER — Other Ambulatory Visit: Payer: Self-pay

## 2022-12-13 ENCOUNTER — Encounter (HOSPITAL_BASED_OUTPATIENT_CLINIC_OR_DEPARTMENT_OTHER): Payer: Self-pay | Admitting: Emergency Medicine

## 2022-12-13 ENCOUNTER — Emergency Department (HOSPITAL_BASED_OUTPATIENT_CLINIC_OR_DEPARTMENT_OTHER)
Admission: EM | Admit: 2022-12-13 | Discharge: 2022-12-13 | Disposition: A | Discharge status: Home / Self Care | Payer: 59 | Attending: Emergency Medicine | Admitting: Emergency Medicine

## 2022-12-13 DIAGNOSIS — E119 Type 2 diabetes mellitus without complications: Secondary | ICD-10-CM | POA: Diagnosis not present

## 2022-12-13 DIAGNOSIS — Z1152 Encounter for screening for COVID-19: Secondary | ICD-10-CM | POA: Diagnosis not present

## 2022-12-13 DIAGNOSIS — I10 Essential (primary) hypertension: Secondary | ICD-10-CM | POA: Insufficient documentation

## 2022-12-13 DIAGNOSIS — R070 Pain in throat: Secondary | ICD-10-CM | POA: Diagnosis present

## 2022-12-13 DIAGNOSIS — J069 Acute upper respiratory infection, unspecified: Secondary | ICD-10-CM | POA: Diagnosis not present

## 2022-12-13 DIAGNOSIS — Z794 Long term (current) use of insulin: Secondary | ICD-10-CM | POA: Diagnosis not present

## 2022-12-13 DIAGNOSIS — B9789 Other viral agents as the cause of diseases classified elsewhere: Secondary | ICD-10-CM | POA: Diagnosis not present

## 2022-12-13 DIAGNOSIS — Z7984 Long term (current) use of oral hypoglycemic drugs: Secondary | ICD-10-CM | POA: Insufficient documentation

## 2022-12-13 LAB — RESP PANEL BY RT-PCR (RSV, FLU A&B, COVID)  RVPGX2
Influenza A by PCR: NEGATIVE
Influenza B by PCR: NEGATIVE
Resp Syncytial Virus by PCR: NEGATIVE
SARS Coronavirus 2 by RT PCR: NEGATIVE

## 2022-12-13 MED ORDER — LIDOCAINE VISCOUS HCL 2 % MT SOLN: 15.0000 mL | Freq: Four times a day (QID) | OROMUCOSAL | 0 refills | Status: DC | PRN

## 2022-12-13 MED ORDER — LIDOCAINE VISCOUS HCL 2 % MT SOLN
15.0000 mL | Freq: Once | OROMUCOSAL | Status: AC
  Administered 2022-12-13: 15 mL via OROMUCOSAL
  Filled 2022-12-13: qty 15

## 2022-12-13 MED ORDER — IBUPROFEN 400 MG PO TABS
600.0000 mg | ORAL_TABLET | Freq: Once | ORAL | Status: AC
  Administered 2022-12-13: 600 mg via ORAL
  Filled 2022-12-13: qty 1

## 2022-12-13 NOTE — ED Triage Notes (Signed)
Pt is c/o ear pain, sore throat, and cough  Pt states she was tested for Covid on Friday and it was negative  Pt states she cannot eat due to pain in her throat  Pt also states she keeps losing her voice

## 2022-12-13 NOTE — ED Provider Notes (Signed)
[facility] Provider Note   CSN: [id number] Arrival date & time: 12/13/22  [id number]     History  Chief Complaint  Patient presents with   Sore Throat    Shelley Mccoy is a 52 y.o. female.  HPI 52 year old female with a history of hypertension and diabetes presents with sore throat.  She has been dealing with symptoms for 6 days.  She has had bilateral intermittent ear pain, sore throat and change in voice, and dry cough, especially at night.  No fevers, shortness of breath, headache.  No neck pain or swelling.  She has pain when swallowing but no difficulty swallowing or drooling.  She states she was tested for COVID at work 2 days ago and it was negative.  Home Medications Prior to Admission medications   Medication Sig Start Date End Date Taking? Authorizing Provider  lidocaine (XYLOCAINE) 2 % solution Use as directed 15 mLs in the mouth or throat every 6 (six) hours as needed for mouth pain. 12/13/22  Yes Sherwood Gambler, MD  albuterol (PROVENTIL) (2.5 MG/3ML) 0.083% nebulizer solution Take 3 mLs (2.5 mg total) by nebulization every 4 (four) hours as needed for wheezing or shortness of breath (coughing fits). 10/02/22   Garnet Sierras, DO  albuterol (VENTOLIN HFA) 108 (90 Base) MCG/ACT inhaler Inhale 2 puffs into the lungs every 4 (four) hours. 08/06/22     Ascorbic Acid (VITAMIN C PO) Take 1 tablet by mouth daily.    [provider]  budesonide (PULMICORT) 0.5 MG/2ML nebulizer solution Take 2 mLs (0.5 mg total) by nebulization in the morning and at bedtime. Take for 1-2 weeks during asthma flare. 10/02/22   Garnet Sierras, DO  cetirizine (ZYRTEC) 10 MG tablet Take 1 tablet (10 mg total) by mouth daily. 08/06/21     cyclobenzaprine (FLEXERIL) 10 MG tablet Take 10 mg by mouth as needed. 10/19/20   [provider]  Dulaglutide (TRULICITY) [id number] [id number] SOPN Inject 0.75 mg into the skin once a week. 08/06/22     EPINEPHrine 0.3 mg/0.3 mL  IJ SOAJ injection Inject 0.3 mg into the muscle as needed. 11/13/22     estradiol (ESTRACE VAGINAL) 0.1 MG/GM vaginal cream Place 1 Applicatorful (4grams) vaginally daily for 2 weeks, then decrease to 2 grams vaginally daily for 2 weeks, then 1 gram vaginally 2 to 3 times weekly. 11/10/21   Constant, Peggy, MD  famotidine (PEPCID) 20 MG tablet Take 1 tablet (20 mg total) by mouth 2 (two) times daily. 08/06/22     Ferrous Sulfate (IRON PO) Take 1 tablet by mouth daily.    [provider]  fluticasone (FLONASE) 50 MCG/ACT nasal spray Place 2 sprays into both nostrils daily. 04/17/22     fluticasone-salmeterol (ADVAIR HFA) 230-21 MCG/ACT inhaler Inhale 2 puffs into the lungs 2 (two) times daily. 12/31/21     fluticasone-salmeterol (ADVAIR) 250-50 MCG/ACT AEPB Inhale 1 puff into the lungs 2 (two) times daily. 09/15/22     gabapentin (NEURONTIN) 300 MG capsule Take 1 capsule (300 mg total) by mouth 3 (three) times daily. 08/06/21     gabapentin (NEURONTIN) 300 MG capsule Take 1 capsule (300 mg total) by mouth 3 (three) times daily. 08/06/22     glipiZIDE (GLUCOTROL) 10 MG tablet Take 10 mg by mouth daily. 07/26/20   [provider]  glucose blood (FREESTYLE LITE) test strip Use 1 strip 2 (two) times daily. 08/06/22     hydrochlorothiazide (HYDRODIURIL) 12.5 MG  tablet Take 1 tablet (12.5 mg total) by mouth daily. 12/31/21     hydrochlorothiazide (HYDRODIURIL) 12.5 MG tablet Take 1 tablet (12.5 mg total) by mouth daily. 08/06/22     ibuprofen (ADVIL) 800 MG tablet Take 800 mg by mouth 3 (three) times daily. 10/19/20   [provider]  insulin degludec (TRESIBA) 100 UNIT/ML FlexTouch Pen Inject 10 Units into the skin daily.    [provider]  losartan (COZAAR) 50 MG tablet Take 1 tablet (50 mg total) by mouth daily. 12/31/21     losartan (COZAAR) 50 MG tablet Take 1 tablet (50 mg total) by mouth daily. 08/06/22     omalizumab (XOLAIR) 150 MG injection INJECT 300 MG SUBCUTANEOUSLY EVERY  4 WEEKS. 07/22/22 07/22/23  Tresa Garter, MD  Omega-3 Fatty Acids (FISH OIL) 1000 MG CAPS Take by mouth.    [provider]  ondansetron (ZOFRAN) 4 MG tablet Take 1 tablet (4 mg total) by mouth every 8 (eight) hours as needed for nausea or vomiting. 11/19/22   Ambs, Kathrine Cords, FNP  pantoprazole (PROTONIX) 40 MG tablet Take by mouth.    [provider]  rosuvastatin (CRESTOR) 20 MG tablet Take 1 tablet (20 mg total) by mouth daily. 12/31/21     rosuvastatin (CRESTOR) 20 MG tablet Take 1 tablet (20 mg total) by mouth daily. 08/06/22     sertraline (ZOLOFT) 100 MG tablet Take 1 tablet (100 mg total) by mouth daily. 12/31/21     sertraline (ZOLOFT) 100 MG tablet Take 1 tablet (100 mg total) by mouth daily. 08/06/22     topiramate (TOPAMAX) 25 MG tablet  07/26/20   [provider]  triamcinolone ointment (KENALOG) 0.1 % Apply 1 gram to affected area topically once daily. 08/06/22     valACYclovir (VALTREX) 1000 MG tablet Take 1 tablet (1,000 mg total) by mouth daily. 04/16/22     valACYclovir (VALTREX) 1000 MG tablet Take 1 tablet (1,000 mg total) by mouth daily. 08/06/22     Vitamin D, Ergocalciferol, (DRISDOL) 1.25 MG (50000 UNIT) CAPS capsule Take 1 capsule (50,000 Units total) by mouth once a week. 12/31/21     Vitamin D, Ergocalciferol, 50000 units CAPS Take 50,000 Units by mouth once a week. 08/06/22         Allergies    Percocet [oxycodone-acetaminophen]    Review of Systems   Review of Systems  Constitutional:  Negative for fever.  HENT:  Positive for ear pain, sore throat and voice change. Negative for congestion.   Respiratory:  Positive for cough. Negative for shortness of breath.   Neurological:  Negative for headaches.    Physical Exam Updated Vital Signs BP (!) 164/95 (BP Location: Right Arm)   Pulse 77   Temp 97.7 F (36.5 C) (Oral)   Resp 20   Ht 5' (1.524 m)   Wt 102.1 kg   SpO2 98%   BMI 43.94 kg/m  Physical Exam Vitals and nursing note  reviewed.  Constitutional:      General: She is not in acute distress.    Appearance: She is well-developed. She is obese. She is not ill-appearing or diaphoretic.  HENT:     Head: Normocephalic and atraumatic.     Right Ear: Tympanic membrane normal.     Left Ear: Tympanic membrane normal.     Mouth/Throat:     Pharynx: Oropharynx is clear. Uvula midline.     Comments: Mildly hoarse voice but no hot-potato voice or stridor Cardiovascular:  Rate and Rhythm: Normal rate and regular rhythm.     Heart sounds: Normal heart sounds.  Pulmonary:     Effort: Pulmonary effort is normal.     Breath sounds: Normal breath sounds.  Skin:    General: Skin is warm and dry.  Neurological:     Mental Status: She is alert.     ED Results / Procedures / Treatments   Labs (all labs ordered are listed, but only abnormal results are displayed) Labs Reviewed  RESP PANEL BY RT-PCR (RSV, FLU A&B, COVID)  [id number]    EKG None  Radiology No results found.  Procedures Procedures    Medications Ordered in ED Medications  ibuprofen (ADVIL) tablet 600 mg (has no administration in time range)  lidocaine (XYLOCAINE) 2 % viscous mouth solution 15 mL (has no administration in time range)    ED Course/ Medical Decision Making/ A&P                             Medical Decision Making Amount and/or Complexity of Data Reviewed External Data Reviewed: notes. Labs: ordered.  Risk Prescription drug management.   Patient is well-appearing.  She has a mildly change in voice consistent with laryngitis but I have very low suspicion for deep space neck infection such as epiglottitis or abscess.  Chart review shows she has had multiple similar visits to her doctor for URI-like symptoms over the last several months.  My suspicion is this is a viral process.  She is afebrile, lungs are clear, no increased work of breathing, etc.  We briefly discussed the possibility of an x-ray but decided to hold off as  it seems like pneumonia would be unlikely.  We discussed over-the-counter supportive care.  She is already using antihistamines and Flonase, will make sure she is also using over-the-counter meds such as Tylenol and ibuprofen and will give viscous lidocaine.  Will swab her for COVID/flu/RSV though given her symptoms started 6 days ago no treatment for these viruses be indicated if they were positive.  I do not think a CT of the neck is warranted.  Will give her a dose of viscous lidocaine here and continue supportive care at home with follow-up with PCP as needed.  She is hypertensive here although she did not yet take her blood pressure meds.  Discharged home with return precautions.        Final Clinical Impression(s) / ED Diagnoses Final diagnoses:  Viral upper respiratory infection    Rx / DC Orders ED Discharge Orders          Ordered    lidocaine (XYLOCAINE) 2 % solution  Every 6 hours PRN        12/13/22 [id number]              Sherwood Gambler, MD 12/13/22 [id number]

## 2022-12-13 NOTE — ED Notes (Signed)
ED Provider at bedside. 

## 2022-12-13 NOTE — Discharge Instructions (Addendum)
You can follow up your Covid, Flu, and RSV test in Mychart. We are prescribing a lidocaine mouth solution to help with your sore throat.  However you should also take ibuprofen and Tylenol intermittently to help with pain and discomfort.  If you develop high fever, severe cough or cough with blood, trouble breathing, severe headache, neck pain/stiffness, vomiting, or any other new/concerning symptoms then return to the ER for evaluation

## 2022-12-14 ENCOUNTER — Other Ambulatory Visit (HOSPITAL_COMMUNITY): Payer: Self-pay

## 2022-12-14 MED ORDER — BENZONATATE 200 MG PO CAPS
200.0000 mg | ORAL_CAPSULE | Freq: Three times a day (TID) | ORAL | 3 refills | Status: DC
  Filled 2022-12-14: qty 30, 10d supply, fill #0

## 2022-12-21 ENCOUNTER — Ambulatory Visit (INDEPENDENT_AMBULATORY_CARE_PROVIDER_SITE_OTHER): Payer: No Typology Code available for payment source | Admitting: *Deleted

## 2022-12-21 DIAGNOSIS — L501 Idiopathic urticaria: Secondary | ICD-10-CM

## 2022-12-25 ENCOUNTER — Other Ambulatory Visit (HOSPITAL_COMMUNITY): Payer: Self-pay

## 2022-12-25 MED ORDER — CETIRIZINE HCL 10 MG PO TABS
10.0000 mg | ORAL_TABLET | Freq: Every day | ORAL | 3 refills | Status: DC
  Filled 2022-12-25: qty 90, 90d supply, fill #0

## 2022-12-25 MED ORDER — EPINEPHRINE 0.3 MG/0.3ML IJ SOAJ
INTRAMUSCULAR | 4 refills | Status: AC
  Filled 2022-12-25: qty 2, 2d supply, fill #0
  Filled 2023-04-28: qty 2, 1d supply, fill #0
  Filled 2023-05-24: qty 2, 1d supply, fill #1
  Filled 2023-06-21 (×2): qty 2, 1d supply, fill #2
  Filled 2023-07-26: qty 2, 2d supply, fill #3
  Filled 2023-09-23 – 2023-10-28 (×2): qty 2, 2d supply, fill #4

## 2023-01-01 ENCOUNTER — Other Ambulatory Visit (HOSPITAL_COMMUNITY): Payer: Self-pay

## 2023-01-01 DIAGNOSIS — E1165 Type 2 diabetes mellitus with hyperglycemia: Secondary | ICD-10-CM | POA: Diagnosis not present

## 2023-01-01 DIAGNOSIS — N930 Postcoital and contact bleeding: Secondary | ICD-10-CM | POA: Diagnosis not present

## 2023-01-01 DIAGNOSIS — E559 Vitamin D deficiency, unspecified: Secondary | ICD-10-CM | POA: Diagnosis not present

## 2023-01-01 DIAGNOSIS — I10 Essential (primary) hypertension: Secondary | ICD-10-CM | POA: Diagnosis not present

## 2023-01-01 DIAGNOSIS — E1169 Type 2 diabetes mellitus with other specified complication: Secondary | ICD-10-CM | POA: Diagnosis not present

## 2023-01-01 DIAGNOSIS — J309 Allergic rhinitis, unspecified: Secondary | ICD-10-CM | POA: Diagnosis not present

## 2023-01-01 DIAGNOSIS — E785 Hyperlipidemia, unspecified: Secondary | ICD-10-CM | POA: Diagnosis not present

## 2023-01-01 DIAGNOSIS — K219 Gastro-esophageal reflux disease without esophagitis: Secondary | ICD-10-CM | POA: Diagnosis not present

## 2023-01-01 DIAGNOSIS — F329 Major depressive disorder, single episode, unspecified: Secondary | ICD-10-CM | POA: Diagnosis not present

## 2023-01-01 DIAGNOSIS — J4520 Mild intermittent asthma, uncomplicated: Secondary | ICD-10-CM | POA: Diagnosis not present

## 2023-01-01 MED ORDER — LOSARTAN POTASSIUM 50 MG PO TABS
50.0000 mg | ORAL_TABLET | Freq: Every day | ORAL | 3 refills | Status: DC
  Filled 2023-01-01 – 2023-02-25 (×2): qty 90, 90d supply, fill #0

## 2023-01-01 MED ORDER — CETIRIZINE HCL 10 MG PO TABS
10.0000 mg | ORAL_TABLET | Freq: Every day | ORAL | 3 refills | Status: DC
  Filled 2023-01-01: qty 90, 90d supply, fill #0

## 2023-01-01 MED ORDER — FLUTICASONE PROPIONATE 50 MCG/ACT NA SUSP
2.0000 | Freq: Every day | NASAL | 3 refills | Status: AC
  Filled 2023-01-01 – 2023-05-28 (×2): qty 16, 30d supply, fill #0
  Filled 2023-06-21 (×2): qty 16, 30d supply, fill #1
  Filled 2023-07-26: qty 16, 30d supply, fill #2
  Filled 2023-09-23 – 2023-10-28 (×2): qty 16, 30d supply, fill #3

## 2023-01-01 MED ORDER — VALACYCLOVIR HCL 1 G PO TABS
1000.0000 mg | ORAL_TABLET | Freq: Every day | ORAL | 3 refills | Status: DC
  Filled 2023-04-07: qty 90, 90d supply, fill #0
  Filled 2023-07-26: qty 90, 90d supply, fill #1

## 2023-01-01 MED ORDER — HYDROCHLOROTHIAZIDE 12.5 MG PO TABS: 12.5000 mg | ORAL_TABLET | Freq: Every day | ORAL | 3 refills | Status: DC

## 2023-01-01 MED ORDER — SERTRALINE HCL 100 MG PO TABS
100.0000 mg | ORAL_TABLET | Freq: Every day | ORAL | 3 refills | Status: DC
  Filled 2023-01-01 – 2023-02-25 (×2): qty 90, 90d supply, fill #0

## 2023-01-01 MED ORDER — ALBUTEROL SULFATE HFA 108 (90 BASE) MCG/ACT IN AERS
2.0000 | INHALATION_SPRAY | RESPIRATORY_TRACT | 3 refills | Status: DC | PRN
  Filled 2023-01-01: qty 13.4, 34d supply, fill #0
  Filled 2023-04-07: qty 13.4, 18d supply, fill #0

## 2023-01-01 MED ORDER — VITAMIN D (ERGOCALCIFEROL) 50000 UNITS PO CAPS
1.0000 | ORAL_CAPSULE | ORAL | 3 refills | Status: DC
  Filled 2023-01-01 – 2023-05-28 (×3): qty 12, 84d supply, fill #0

## 2023-01-01 MED ORDER — ROSUVASTATIN CALCIUM 20 MG PO TABS
20.0000 mg | ORAL_TABLET | Freq: Every day | ORAL | 3 refills | Status: DC
  Filled 2023-02-25: qty 90, 90d supply, fill #0

## 2023-01-01 MED ORDER — FAMOTIDINE 20 MG PO TABS
20.0000 mg | ORAL_TABLET | Freq: Two times a day (BID) | ORAL | 3 refills | Status: DC
  Filled 2023-02-25: qty 180, 90d supply, fill #0

## 2023-01-01 MED ORDER — TRULICITY 0.75 MG/0.5ML ~~LOC~~ SOAJ
0.7500 mg | SUBCUTANEOUS | 3 refills | Status: DC
  Filled 2023-01-01 – 2023-06-21 (×3): qty 2, 28d supply, fill #0
  Filled 2023-07-26: qty 2, 28d supply, fill #1
  Filled 2023-09-23 – 2023-10-28 (×2): qty 2, 28d supply, fill #2
  Filled 2023-11-30 – 2023-12-27 (×2): qty 2, 28d supply, fill #3

## 2023-01-04 ENCOUNTER — Other Ambulatory Visit (HOSPITAL_COMMUNITY): Payer: Self-pay

## 2023-01-04 MED ORDER — DEXCOM G6 SENSOR MISC
11 refills | Status: DC
  Filled 2023-01-04: qty 3, 30d supply, fill #0
  Filled 2023-01-18 – 2023-02-01 (×2): qty 3, 30d supply, fill #1
  Filled 2023-02-23 – 2023-02-25 (×2): qty 3, 30d supply, fill #2
  Filled 2023-03-25: qty 3, 30d supply, fill #3
  Filled 2023-04-28: qty 3, 30d supply, fill #4
  Filled 2023-05-28: qty 3, 30d supply, fill #5
  Filled 2023-06-21 (×2): qty 3, 30d supply, fill #6
  Filled 2023-07-27: qty 3, 30d supply, fill #7
  Filled 2023-09-23 – 2023-10-11 (×2): qty 3, 30d supply, fill #8
  Filled 2023-11-05: qty 3, 30d supply, fill #9
  Filled 2023-12-27: qty 3, 30d supply, fill #10

## 2023-01-04 MED ORDER — ESTRADIOL 0.1 MG/GM VA CREA
1.0000 | TOPICAL_CREAM | Freq: Every day | VAGINAL | 1 refills | Status: DC
  Filled 2023-01-04: qty 42.5, 90d supply, fill #0
  Filled 2023-05-24: qty 42.5, 42d supply, fill #0
  Filled 2023-07-26: qty 42.5, 42d supply, fill #1

## 2023-01-04 MED ORDER — DEXCOM G6 RECEIVER DEVI
0 refills | Status: DC
  Filled 2023-01-04 – 2023-03-25 (×2): qty 1, 90d supply, fill #0

## 2023-01-04 MED ORDER — DEXCOM G6 TRANSMITTER MISC
3 refills | Status: DC
  Filled 2023-01-04 – 2023-01-06 (×2): qty 1, 90d supply, fill #0
  Filled 2023-03-25: qty 1, 90d supply, fill #1
  Filled 2023-06-22: qty 1, 90d supply, fill #2
  Filled 2023-09-23 – 2023-10-11 (×2): qty 1, 90d supply, fill #3

## 2023-01-05 ENCOUNTER — Other Ambulatory Visit (HOSPITAL_COMMUNITY): Payer: Self-pay

## 2023-01-06 ENCOUNTER — Other Ambulatory Visit (HOSPITAL_COMMUNITY): Payer: Self-pay

## 2023-01-07 ENCOUNTER — Other Ambulatory Visit (HOSPITAL_COMMUNITY): Payer: Self-pay

## 2023-01-08 ENCOUNTER — Other Ambulatory Visit: Payer: Self-pay

## 2023-01-08 ENCOUNTER — Other Ambulatory Visit (HOSPITAL_COMMUNITY): Payer: Self-pay

## 2023-01-11 ENCOUNTER — Other Ambulatory Visit (HOSPITAL_COMMUNITY): Payer: Self-pay

## 2023-01-11 ENCOUNTER — Other Ambulatory Visit: Payer: Self-pay

## 2023-01-12 ENCOUNTER — Other Ambulatory Visit (HOSPITAL_COMMUNITY): Payer: Self-pay

## 2023-01-12 ENCOUNTER — Telehealth: Payer: Self-pay | Admitting: Pharmacist

## 2023-01-12 ENCOUNTER — Other Ambulatory Visit: Payer: Self-pay

## 2023-01-12 ENCOUNTER — Other Ambulatory Visit: Payer: Self-pay | Admitting: *Deleted

## 2023-01-12 MED ORDER — OMALIZUMAB 150 MG/ML ~~LOC~~ SOSY
300.0000 mg | PREFILLED_SYRINGE | SUBCUTANEOUS | 11 refills | Status: DC
  Filled 2023-01-12: qty 2, 28d supply, fill #0

## 2023-01-12 NOTE — Telephone Encounter (Signed)
Called patient to schedule an appointment for the [facility]. I was unable to reach the patient so I left a HIPAA-compliant message requesting that the patient return my call.   Luke Van Ausdall, PharmD, BCACP, CPP Clinical Pharmacist [facility] [phone]  

## 2023-01-14 ENCOUNTER — Other Ambulatory Visit (HOSPITAL_COMMUNITY): Payer: Self-pay

## 2023-01-18 ENCOUNTER — Ambulatory Visit: Payer: 59

## 2023-01-18 ENCOUNTER — Other Ambulatory Visit (HOSPITAL_COMMUNITY): Payer: Self-pay

## 2023-01-21 ENCOUNTER — Other Ambulatory Visit (HOSPITAL_COMMUNITY): Payer: Self-pay

## 2023-01-23 DIAGNOSIS — J209 Acute bronchitis, unspecified: Secondary | ICD-10-CM | POA: Diagnosis not present

## 2023-01-23 DIAGNOSIS — D509 Iron deficiency anemia, unspecified: Secondary | ICD-10-CM | POA: Diagnosis not present

## 2023-01-23 DIAGNOSIS — Z794 Long term (current) use of insulin: Secondary | ICD-10-CM | POA: Diagnosis not present

## 2023-01-23 DIAGNOSIS — R059 Cough, unspecified: Secondary | ICD-10-CM | POA: Diagnosis not present

## 2023-01-23 DIAGNOSIS — E1165 Type 2 diabetes mellitus with hyperglycemia: Secondary | ICD-10-CM | POA: Diagnosis not present

## 2023-01-23 DIAGNOSIS — F419 Anxiety disorder, unspecified: Secondary | ICD-10-CM | POA: Diagnosis not present

## 2023-01-23 DIAGNOSIS — Z0389 Encounter for observation for other suspected diseases and conditions ruled out: Secondary | ICD-10-CM | POA: Diagnosis not present

## 2023-01-23 DIAGNOSIS — J40 Bronchitis, not specified as acute or chronic: Secondary | ICD-10-CM | POA: Diagnosis not present

## 2023-01-23 DIAGNOSIS — J45901 Unspecified asthma with (acute) exacerbation: Secondary | ICD-10-CM | POA: Diagnosis not present

## 2023-01-23 DIAGNOSIS — R0600 Dyspnea, unspecified: Secondary | ICD-10-CM | POA: Diagnosis not present

## 2023-01-24 DIAGNOSIS — R0602 Shortness of breath: Secondary | ICD-10-CM | POA: Diagnosis not present

## 2023-01-26 ENCOUNTER — Ambulatory Visit (INDEPENDENT_AMBULATORY_CARE_PROVIDER_SITE_OTHER): Payer: 59

## 2023-01-26 DIAGNOSIS — L501 Idiopathic urticaria: Secondary | ICD-10-CM

## 2023-01-28 ENCOUNTER — Telehealth: Payer: Self-pay | Admitting: Pharmacist

## 2023-01-28 NOTE — Telephone Encounter (Signed)
Called patient to schedule an appointment for the [facility]. I was unable to reach the patient so I left a HIPAA-compliant message requesting that the patient return my call.   Luke Van Ausdall, PharmD, BCACP, CPP Clinical Pharmacist [facility] [phone]  

## 2023-02-01 ENCOUNTER — Other Ambulatory Visit (HOSPITAL_COMMUNITY): Payer: Self-pay

## 2023-02-02 ENCOUNTER — Other Ambulatory Visit (HOSPITAL_COMMUNITY): Payer: Self-pay

## 2023-02-04 ENCOUNTER — Other Ambulatory Visit (HOSPITAL_COMMUNITY): Payer: Self-pay

## 2023-02-11 ENCOUNTER — Other Ambulatory Visit (HOSPITAL_COMMUNITY): Payer: Self-pay

## 2023-02-16 ENCOUNTER — Other Ambulatory Visit (HOSPITAL_COMMUNITY): Payer: Self-pay

## 2023-02-23 ENCOUNTER — Ambulatory Visit: Payer: 59

## 2023-02-23 ENCOUNTER — Other Ambulatory Visit (HOSPITAL_COMMUNITY): Payer: Self-pay

## 2023-02-23 ENCOUNTER — Ambulatory Visit: Payer: 59 | Admitting: Pharmacist

## 2023-02-23 ENCOUNTER — Other Ambulatory Visit: Payer: Self-pay

## 2023-02-23 DIAGNOSIS — Z79899 Other long term (current) drug therapy: Secondary | ICD-10-CM

## 2023-02-23 MED ORDER — OMALIZUMAB 150 MG/ML ~~LOC~~ SOSY
300.0000 mg | PREFILLED_SYRINGE | SUBCUTANEOUS | 11 refills | Status: DC
  Filled 2023-02-23: qty 2, 28d supply, fill #0
  Filled 2023-03-10: qty 2, 28d supply, fill #1
  Filled 2023-04-14: qty 2, 28d supply, fill #2
  Filled 2023-05-12: qty 2, 28d supply, fill #3
  Filled 2023-06-10: qty 2, 28d supply, fill #4
  Filled 2023-07-09: qty 2, 28d supply, fill #5
  Filled 2023-07-26 – 2023-08-04 (×3): qty 2, 28d supply, fill #6
  Filled 2023-09-02: qty 2, 28d supply, fill #7
  Filled 2023-10-01: qty 2, 28d supply, fill #8
  Filled 2023-10-28: qty 2, 28d supply, fill #9
  Filled 2023-11-26: qty 2, 28d supply, fill #10
  Filled 2023-12-27: qty 2, 28d supply, fill #11

## 2023-02-23 NOTE — Progress Notes (Signed)
   S: Patient presents for review of their specialty medication therapy.  Patient is currently taking Xolair for chronic urticaria. Patient is managed by Dr. Selena Batten for this.   Adherence: confirms  Efficacy: reports good results  Dosing: Give subcutaneously.  Can be dosed every 2 or 4 weeks based on baseline serum IgE levels and body weight. Chronic idiopathic urticaria: SubQ: 150 or 300 mg every 4 weeks. Dosing is not dependent on serum IgE (free or total) level or body weight.  Dose adjustments: Renal: no dose adjustments  Hepatic: no dose adjustments  Toxicity: Severe hypersensitivity reaction or anaphylaxis: Discontinue treatment. Fever, arthralgia, and rash: Discontinue treatment if this constellation of symptoms occurs.  Drug-drug interactions: none identified   Monitoring: CV effects: denied  Eosinophilia and vasculitis: denied  Fever/arthralgia/rash: denied  Hypersensitivity/Anaphylaxis: denied  Malignant neoplasms: denied  Pulmonary function tests (for asthma): denied    O:     Lab Results  Component Value Date   WBC 5.4 11/15/2021   HGB 11.5 (L) 11/15/2021   HCT 35.8 (L) 11/15/2021   MCV 74.3 (L) 11/15/2021   PLT 224 11/15/2021      Chemistry      Component Value Date/Time   NA 140 11/15/2021 1358   NA 139 03/23/2019 1601   K 3.7 11/15/2021 1358   CL 106 11/15/2021 1358   CO2 28 11/15/2021 1358   BUN 11 11/15/2021 1358   BUN 15 03/23/2019 1601   CREATININE 0.72 11/15/2021 1358      Component Value Date/Time   CALCIUM 8.8 (L) 11/15/2021 1358   ALKPHOS 112 11/15/2021 1358   AST 19 11/15/2021 1358   ALT 22 11/15/2021 1358   BILITOT 0.7 11/15/2021 1358   BILITOT 0.6 03/23/2019 1601       A/P: 1. Medication review: patient currently on Xolair for chronic urticaria. Reviewed the medication with the patient, including the following: Xolair, omalizumab, is a novel IgE blocker.  It appears to reduce rates of hospitalizations, ER visits and unscheduled  physician visits due asthma exacerbations when added to standard therapy.  Studies also show a reduction in steroid requirements and improvement in quality of life.  Patient educated on purpose, proper use and potential adverse effects of Xolair.  Following instruction patient verbalized understanding. Patient should always have an EpiPen readily available in the event of anaphylaxis. SubQ: For SubQ injection only; doses >150 mg should be divided over more than one injection site (eg, 225 mg or 300 mg administered as two injections, 375 mg administered as three injections); each injection site should be separated by ?1 inch. Do not inject into moles, scars, bruises, tender areas, or broken skin. Injections may take 5 to 10 seconds to administer (solution is slightly viscous). Administer only under direct medical supervision and observe patient for 2 hours after the first 3 injections and 30 minutes after subsequent injections Angela Nevin 2015) or in accordance with individual institution policies and procedures.No recommendations for any changes at this time.   Butch Penny, PharmD, P[place]y [place], CPP Clinical Pharmacist [facility] [phone]

## 2023-02-25 ENCOUNTER — Ambulatory Visit (INDEPENDENT_AMBULATORY_CARE_PROVIDER_SITE_OTHER): Payer: No Typology Code available for payment source

## 2023-02-25 ENCOUNTER — Other Ambulatory Visit (HOSPITAL_COMMUNITY): Payer: Self-pay

## 2023-02-25 DIAGNOSIS — L501 Idiopathic urticaria: Secondary | ICD-10-CM

## 2023-03-10 ENCOUNTER — Other Ambulatory Visit: Payer: Self-pay

## 2023-03-15 ENCOUNTER — Other Ambulatory Visit (HOSPITAL_COMMUNITY): Payer: Self-pay

## 2023-03-16 ENCOUNTER — Other Ambulatory Visit: Payer: Self-pay

## 2023-03-16 ENCOUNTER — Other Ambulatory Visit (HOSPITAL_COMMUNITY): Payer: Self-pay

## 2023-03-19 ENCOUNTER — Encounter (HOSPITAL_COMMUNITY): Payer: Self-pay | Admitting: Emergency Medicine

## 2023-03-19 ENCOUNTER — Other Ambulatory Visit: Payer: Self-pay

## 2023-03-19 ENCOUNTER — Emergency Department (HOSPITAL_COMMUNITY)
Admission: EM | Admit: 2023-03-19 | Discharge: 2023-03-20 | Disposition: A | Discharge status: Home / Self Care | Payer: 59 | Attending: Emergency Medicine | Admitting: Emergency Medicine

## 2023-03-19 DIAGNOSIS — R030 Elevated blood-pressure reading, without diagnosis of hypertension: Secondary | ICD-10-CM | POA: Diagnosis not present

## 2023-03-19 DIAGNOSIS — Z7984 Long term (current) use of oral hypoglycemic drugs: Secondary | ICD-10-CM | POA: Insufficient documentation

## 2023-03-19 DIAGNOSIS — M546 Pain in thoracic spine: Secondary | ICD-10-CM | POA: Diagnosis not present

## 2023-03-19 DIAGNOSIS — E119 Type 2 diabetes mellitus without complications: Secondary | ICD-10-CM | POA: Insufficient documentation

## 2023-03-19 DIAGNOSIS — M549 Dorsalgia, unspecified: Secondary | ICD-10-CM

## 2023-03-19 DIAGNOSIS — I10 Essential (primary) hypertension: Secondary | ICD-10-CM | POA: Diagnosis not present

## 2023-03-19 DIAGNOSIS — M47814 Spondylosis without myelopathy or radiculopathy, thoracic region: Secondary | ICD-10-CM | POA: Diagnosis not present

## 2023-03-19 DIAGNOSIS — M545 Low back pain, unspecified: Secondary | ICD-10-CM | POA: Diagnosis not present

## 2023-03-19 LAB — URINALYSIS, ROUTINE W REFLEX MICROSCOPIC
Bilirubin Urine: NEGATIVE
Glucose, UA: NEGATIVE mg/dL
Hgb urine dipstick: NEGATIVE
Ketones, ur: NEGATIVE mg/dL
Leukocytes,Ua: NEGATIVE
Nitrite: NEGATIVE
Protein, ur: NEGATIVE mg/dL
Specific Gravity, Urine: 1.012 (ref 1.005–1.030)
pH: 5 (ref 5.0–8.0)

## 2023-03-19 MED ORDER — ACETAMINOPHEN 325 MG PO TABS
650.0000 mg | ORAL_TABLET | Freq: Once | ORAL | Status: AC
  Administered 2023-03-20: 650 mg via ORAL
  Filled 2023-03-19: qty 2

## 2023-03-19 NOTE — ED Provider Notes (Incomplete)
[place] [facility] DEPARTMENT AT [facility] Provider Note   CSN: [id number] Arrival date & time: 03/19/23  2116     History {Add pertinent medical, surgical, social history, OB history to HPI:1} Chief Complaint  Patient presents with  . Back Pain   HPI Shelley Mccoy is a 52 y.o. female with type 2 diabetes, hypertension presenting for back pain and elevated blood pressure.  Back pain started 2 weeks ago.  Denies trauma.  Located in the midline thoracic spine and radiates rightward.  Denies urinary symptoms.  Twisting her torso makes it worse.  Denies saddle anesthesia, urinary or bowel incontinence and fever.  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  Vital were taken and she was told that her systolic blood pressure was in the 180s.  She denies visual disturbance, headache, chest pain and shortness of breath and abdominal pain.  Denies urinary changes.   Back Pain      Home Medications Prior to Admission medications   Medication Sig Start Date End Date Taking? Authorizing Provider  albuterol (PROVENTIL) (2.5 MG/3ML) 0.083% nebulizer solution Take 3 mLs (2.5 mg total) by nebulization every 4 (four) hours as needed for wheezing or shortness of breath (coughing fits). 10/02/22   Ellamae Sia, DO  albuterol (VENTOLIN HFA) 108 (90 Base) MCG/ACT inhaler Inhale 2 puffs into the lungs every 4 (four) hours. 08/06/22     albuterol (VENTOLIN HFA) 108 (90 Base) MCG/ACT inhaler Inhale 2 puffs into the lungs every 4 (four) hours. 01/01/23     Ascorbic Acid (VITAMIN C PO) Take 1 tablet by mouth daily.    [provider]  budesonide (PULMICORT) 0.5 MG/2ML nebulizer solution Take 2 mLs (0.5 mg total) by nebulization in the morning and at bedtime. Take for 1-2 weeks during asthma flare. 10/02/22   Ellamae Sia, DO  cetirizine (ZYRTEC) 10 MG tablet Take 1 tablet (10 mg total) by mouth daily. 08/06/21     cetirizine (ZYRTEC) 10 MG  tablet Take 1 tablet (10 mg total) by mouth daily. 12/25/22     cetirizine (ZYRTEC) 10 MG tablet Take 1 tablet (10 mg total) by mouth daily. 01/01/23     Continuous Blood Gluc Receiver (DEXCOM G6 RECEIVER) DEVI Use as directed 01/01/23     Continuous Glucose Sensor (DEXCOM G6 SENSOR) MISC Use as directed. (change every 10 days) 01/01/23     Continuous Blood Gluc Transmit (DEXCOM G6 TRANSMITTER) MISC Use as directed 01/01/23     cyclobenzaprine (FLEXERIL) 10 MG tablet Take 10 mg by mouth as needed. 10/19/20   [provider]  Dulaglutide (TRULICITY) 0.75 MG/0.5ML SOPN Inject 0.75 mg into the skin once a week. 08/06/22     Dulaglutide (TRULICITY) 0.75 MG/0.5ML SOPN Inject 0.75 mg into the skin once a week. 01/01/23     EPINEPHrine 0.3 mg/0.3 mL IJ SOAJ injection Inject 0.3 mg into the muscle as needed. 11/13/22     EPINEPHrine 0.3 mg/0.3 mL IJ SOAJ injection Use as directed as needed 12/25/22     estradiol (ESTRACE) 0.1 MG/GM vaginal cream Insert 1 g every day by vaginal route for 14 days, for vaginal irritation. 01/03/23     famotidine (PEPCID) 20 MG tablet Take 1 tablet (20 mg total) by mouth 2 (two) times daily. 08/06/22     famotidine (PEPCID) 20 MG tablet Take 1 tablet (20 mg total) by mouth 2 (two) times daily. 01/01/23  Ferrous Sulfate (IRON PO) Take 1 tablet by mouth daily.    [provider]  fluticasone (FLONASE) 50 MCG/ACT nasal spray Place 2 sprays into both nostrils daily. 01/01/23     fluticasone-salmeterol (ADVAIR HFA) 230-21 MCG/ACT inhaler Inhale 2 puffs into the lungs 2 (two) times daily. 12/31/21     fluticasone-salmeterol (ADVAIR) 250-50 MCG/ACT AEPB Inhale 1 puff into the lungs 2 (two) times daily. 09/15/22     gabapentin (NEURONTIN) 300 MG capsule Take 1 capsule (300 mg total) by mouth 3 (three) times daily. 08/06/21     gabapentin (NEURONTIN) 300 MG capsule Take 1 capsule (300 mg total) by mouth 3 (three) times daily. 08/06/22     glipiZIDE (GLUCOTROL) 10 MG tablet Take 10 mg by  mouth daily. 07/26/20   [provider]  glucose blood (FREESTYLE LITE) test strip Use 1 strip 2 (two) times daily. 08/06/22     hydrochlorothiazide (HYDRODIURIL) 12.5 MG tablet Take 1 tablet (12.5 mg total) by mouth daily. 12/31/21     hydrochlorothiazide (HYDRODIURIL) 12.5 MG tablet Take 1 tablet (12.5 mg total) by mouth daily. 08/06/22     hydrochlorothiazide (HYDRODIURIL) 12.5 MG tablet Take 1 tablet (12.5 mg total) by mouth daily. 01/01/23     ibuprofen (ADVIL) 800 MG tablet Take 800 mg by mouth 3 (three) times daily. 10/19/20   [provider]  insulin degludec (TRESIBA) 100 UNIT/ML FlexTouch Pen Inject 10 Units into the skin daily.    [provider]  lidocaine (XYLOCAINE) 2 % solution Use as directed 15 mLs in the mouth or throat every 6 (six) hours as needed for mouth pain. 12/13/22   Pricilla Loveless, MD  losartan (COZAAR) 50 MG tablet Take 1 tablet (50 mg total) by mouth daily. 12/31/21     losartan (COZAAR) 50 MG tablet Take 1 tablet (50 mg total) by mouth daily. 08/06/22     losartan (COZAAR) 50 MG tablet Take 1 tablet (50 mg total) by mouth daily. 01/01/23     omalizumab Geoffry Paradise) 150 MG/ML prefilled syringe Inject 300 mg into the skin every 28 (twenty-eight) days. 02/23/23   Quentin Angst, MD  Omega-3 Fatty Acids (FISH OIL) 1000 MG CAPS Take by mouth.    [provider]  ondansetron (ZOFRAN) 4 MG tablet Take 1 tablet (4 mg total) by mouth every 8 (eight) hours as needed for nausea or vomiting. 11/19/22   Ambs, Norvel Richards, FNP  pantoprazole (PROTONIX) 40 MG tablet Take by mouth.    [provider]  rosuvastatin (CRESTOR) 20 MG tablet Take 1 tablet (20 mg total) by mouth daily. 12/31/21     rosuvastatin (CRESTOR) 20 MG tablet Take 1 tablet (20 mg total) by mouth daily. 08/06/22     rosuvastatin (CRESTOR) 20 MG tablet Take 1 tablet (20 mg total) by mouth daily. 01/01/23     sertraline (ZOLOFT) 100 MG tablet Take 1 tablet (100 mg total) by mouth daily. 12/31/21      sertraline (ZOLOFT) 100 MG tablet Take 1 tablet (100 mg total) by mouth daily. 08/06/22     sertraline (ZOLOFT) 100 MG tablet Take 1 tablet (100 mg total) by mouth daily. 01/01/23     topiramate (TOPAMAX) 25 MG tablet  07/26/20   [provider]  triamcinolone ointment (KENALOG) 0.1 % Apply 1 gram to affected area topically once daily. 08/06/22     valACYclovir (VALTREX) 1000 MG tablet Take 1 tablet (1,000 mg total) by mouth daily. 04/16/22     valACYclovir (VALTREX)  1000 MG tablet Take 1 tablet (1,000 mg total) by mouth daily. 08/06/22     valACYclovir (VALTREX) 1000 MG tablet Take 1 tablet (1,000 mg total) by mouth daily. 01/01/23     Vitamin D, Ergocalciferol, (DRISDOL) 1.25 MG (50000 UNIT) CAPS capsule Take 1 capsule (50,000 Units total) by mouth once a week. 12/31/21     Vitamin D, Ergocalciferol, 50000 units CAPS Take 50,000 Units by mouth once a week. 08/06/22     Vitamin D, Ergocalciferol, 50000 units CAPS Take 1 capsule by mouth once a week. 01/01/23         Allergies    Percocet [oxycodone-acetaminophen]    Review of Systems   Review of Systems  Musculoskeletal:  Positive for back pain.    Physical Exam Updated Vital Signs BP (!) 174/98 (BP Location: Right Arm)   Pulse 65   Temp 98.2 F (36.8 C)   Resp 16   Ht 5' (1.524 m)   Wt 102 kg   SpO2 100%   BMI 43.92 kg/m  Physical Exam Constitutional:      Appearance: Normal appearance.  HENT:     Head: Normocephalic.     Nose: Nose normal.  Eyes:     Conjunctiva/sclera: Conjunctivae normal.  Cardiovascular:     Rate and Rhythm: Normal rate and regular rhythm.     Pulses: Normal pulses.  Pulmonary:     Effort: Pulmonary effort is normal.     Breath sounds: Normal breath sounds.  Musculoskeletal:     Thoracic back: Tenderness present. No swelling, edema, deformity, signs of trauma or bony tenderness. Normal range of motion.  Neurological:     Mental Status: She is alert.  Psychiatric:        Mood and Affect: Mood  normal.     ED Results / Procedures / Treatments   Labs (all labs ordered are listed, but only abnormal results are displayed) Labs Reviewed  URINALYSIS, ROUTINE W REFLEX MICROSCOPIC    EKG None  Radiology No results found.  Procedures Procedures  {Document cardiac monitor, telemetry assessment procedure when appropriate:1}  Medications Ordered in ED Medications  acetaminophen (TYLENOL) tablet 650 mg (has no administration in time range)    ED Course/ Medical Decision Making/ A&P   {   Click here for ABCD2, HEART and other calculatorsREFRESH Note before signing :1}                          Medical Decision Making Amount and/or Complexity of Data Reviewed Labs: ordered.  Risk OTC drugs.   ***  {Document critical care time when appropriate:1} {Document review of labs and clinical decision tools ie heart score, Chads2Vasc2 etc:1}  {Document your independent review of radiology images, and any outside records:1} {Document your discussion with family members, caretakers, and with consultants:1} {Document social determinants of health affecting pt's care:1} {Document your decision making why or why not admission, treatments were needed:1} Final Clinical Impression(s) / ED Diagnoses Final diagnoses:  None    Rx / DC Orders ED Discharge Orders     None

## 2023-03-19 NOTE — ED Provider Notes (Addendum)
[place] EMERGENCY DEPARTMENT AT [facility] Provider Note   CSN: [id number] Arrival date & time: 03/19/23  2116     History  Chief Complaint  Patient presents with   Back Pain   HPI Shelley Mccoy is a 52 y.o. female with type 2 diabetes, hypertension presenting for back pain and elevated blood pressure.  Back pain started 2 weeks ago.  Denies trauma.  Located in the midline thoracic spine and radiates rightward.  Denies urinary symptoms.  Twisting her torso makes it worse.  Denies saddle anesthesia, urinary or bowel incontinence and fever.  She works here on [employer] and was having back pain that was worse than usual. Vital were taken and she was told that her systolic blood pressure was in the 180s and advised to go to the ED.  She denies visual disturbance, headache, chest pain and shortness of breath and abdominal pain.  Denies urinary changes.  States she did take her HCTZ and losartan today as she normally does.   Back Pain      Home Medications Prior to Admission medications   Medication Sig Start Date End Date Taking? Authorizing Provider  albuterol (PROVENTIL) (2.5 MG/3ML) 0.083% nebulizer solution Take 3 mLs (2.5 mg total) by nebulization every 4 (four) hours as needed for wheezing or shortness of breath (coughing fits). 10/02/22   Ellamae Sia, DO  albuterol (VENTOLIN HFA) 108 (90 Base) MCG/ACT inhaler Inhale 2 puffs into the lungs every 4 (four) hours. 08/06/22     albuterol (VENTOLIN HFA) 108 (90 Base) MCG/ACT inhaler Inhale 2 puffs into the lungs every 4 (four) hours. 01/01/23     Ascorbic Acid (VITAMIN C PO) Take 1 tablet by mouth daily.    [provider]  budesonide (PULMICORT) 0.5 MG/2ML nebulizer solution Take 2 mLs (0.5 mg total) by nebulization in the morning and at bedtime. Take for 1-2 weeks during asthma flare. 10/02/22   Ellamae Sia, DO  cetirizine (ZYRTEC) 10 MG tablet Take 1 tablet (10 mg total) by mouth daily. 08/06/21     cetirizine (ZYRTEC)  10 MG tablet Take 1 tablet (10 mg total) by mouth daily. 12/25/22     cetirizine (ZYRTEC) 10 MG tablet Take 1 tablet (10 mg total) by mouth daily. 01/01/23     Continuous Blood Gluc Receiver (DEXCOM G6 RECEIVER) DEVI Use as directed 01/01/23     Continuous Glucose Sensor (DEXCOM G6 SENSOR) MISC Use as directed. (change every 10 days) 01/01/23     Continuous Blood Gluc Transmit (DEXCOM G6 TRANSMITTER) MISC Use as directed 01/01/23     cyclobenzaprine (FLEXERIL) 10 MG tablet Take 10 mg by mouth as needed. 10/19/20   [provider]  Dulaglutide (TRULICITY) 0.75 MG/0.5ML SOPN Inject 0.75 mg into the skin once a week. 08/06/22     Dulaglutide (TRULICITY) 0.75 MG/0.5ML SOPN Inject 0.75 mg into the skin once a week. 01/01/23     EPINEPHrine 0.3 mg/0.3 mL IJ SOAJ injection Inject 0.3 mg into the muscle as needed. 11/13/22     EPINEPHrine 0.3 mg/0.3 mL IJ SOAJ injection Use as directed as needed 12/25/22     estradiol (ESTRACE) 0.1 MG/GM vaginal cream Insert 1 g every day by vaginal route for 14 days, for vaginal irritation. 01/03/23     famotidine (PEPCID) 20 MG tablet Take 1 tablet (20 mg total) by mouth 2 (two) times daily. 08/06/22     famotidine (PEPCID) 20 MG tablet Take 1 tablet (20 mg total) by  mouth 2 (two) times daily. 01/01/23     Ferrous Sulfate (IRON PO) Take 1 tablet by mouth daily.    [provider]  fluticasone (FLONASE) 50 MCG/ACT nasal spray Place 2 sprays into both nostrils daily. 01/01/23     fluticasone-salmeterol (ADVAIR HFA) 230-21 MCG/ACT inhaler Inhale 2 puffs into the lungs 2 (two) times daily. 12/31/21     fluticasone-salmeterol (ADVAIR) 250-50 MCG/ACT AEPB Inhale 1 puff into the lungs 2 (two) times daily. 09/15/22     gabapentin (NEURONTIN) 300 MG capsule Take 1 capsule (300 mg total) by mouth 3 (three) times daily. 08/06/21     gabapentin (NEURONTIN) 300 MG capsule Take 1 capsule (300 mg total) by mouth 3 (three) times daily. 08/06/22     glipiZIDE (GLUCOTROL) 10 MG tablet Take 10 mg  by mouth daily. 07/26/20   [provider]  glucose blood (FREESTYLE LITE) test strip Use 1 strip 2 (two) times daily. 08/06/22     hydrochlorothiazide (HYDRODIURIL) 12.5 MG tablet Take 1 tablet (12.5 mg total) by mouth daily. 12/31/21     hydrochlorothiazide (HYDRODIURIL) 12.5 MG tablet Take 1 tablet (12.5 mg total) by mouth daily. 08/06/22     hydrochlorothiazide (HYDRODIURIL) 12.5 MG tablet Take 1 tablet (12.5 mg total) by mouth daily. 01/01/23     ibuprofen (ADVIL) 800 MG tablet Take 800 mg by mouth 3 (three) times daily. 10/19/20   [provider]  insulin degludec (TRESIBA) 100 UNIT/ML FlexTouch Pen Inject 10 Units into the skin daily.    [provider]  lidocaine (XYLOCAINE) 2 % solution Use as directed 15 mLs in the mouth or throat every 6 (six) hours as needed for mouth pain. 12/13/22   Pricilla Loveless, MD  losartan (COZAAR) 50 MG tablet Take 1 tablet (50 mg total) by mouth daily. 12/31/21     losartan (COZAAR) 50 MG tablet Take 1 tablet (50 mg total) by mouth daily. 08/06/22     losartan (COZAAR) 50 MG tablet Take 1 tablet (50 mg total) by mouth daily. 01/01/23     omalizumab Geoffry Paradise) 150 MG/ML prefilled syringe Inject 300 mg into the skin every 28 (twenty-eight) days. 02/23/23   Quentin Angst, MD  Omega-3 Fatty Acids (FISH OIL) 1000 MG CAPS Take by mouth.    [provider]  ondansetron (ZOFRAN) 4 MG tablet Take 1 tablet (4 mg total) by mouth every 8 (eight) hours as needed for nausea or vomiting. 11/19/22   Ambs, Norvel Richards, FNP  pantoprazole (PROTONIX) 40 MG tablet Take by mouth.    [provider]  rosuvastatin (CRESTOR) 20 MG tablet Take 1 tablet (20 mg total) by mouth daily. 12/31/21     rosuvastatin (CRESTOR) 20 MG tablet Take 1 tablet (20 mg total) by mouth daily. 08/06/22     rosuvastatin (CRESTOR) 20 MG tablet Take 1 tablet (20 mg total) by mouth daily. 01/01/23     sertraline (ZOLOFT) 100 MG tablet Take 1 tablet (100 mg total) by mouth daily. 12/31/21      sertraline (ZOLOFT) 100 MG tablet Take 1 tablet (100 mg total) by mouth daily. 08/06/22     sertraline (ZOLOFT) 100 MG tablet Take 1 tablet (100 mg total) by mouth daily. 01/01/23     topiramate (TOPAMAX) 25 MG tablet  07/26/20   [provider]  triamcinolone ointment (KENALOG) 0.1 % Apply 1 gram to affected area topically once daily. 08/06/22     valACYclovir (VALTREX) 1000 MG tablet Take 1 tablet (1,000 mg total)  by mouth daily. 04/16/22     valACYclovir (VALTREX) 1000 MG tablet Take 1 tablet (1,000 mg total) by mouth daily. 08/06/22     valACYclovir (VALTREX) 1000 MG tablet Take 1 tablet (1,000 mg total) by mouth daily. 01/01/23     Vitamin D, Ergocalciferol, (DRISDOL) 1.25 MG (50000 UNIT) CAPS capsule Take 1 capsule (50,000 Units total) by mouth once a week. 12/31/21     Vitamin D, Ergocalciferol, 50000 units CAPS Take 50,000 Units by mouth once a week. 08/06/22     Vitamin D, Ergocalciferol, 50000 units CAPS Take 1 capsule by mouth once a week. 01/01/23         Allergies    Percocet [oxycodone-acetaminophen]    Review of Systems   Review of Systems  Musculoskeletal:  Positive for back pain.    Physical Exam Updated Vital Signs BP (!) 142/64   Pulse (!) 56   Temp 98.2 F (36.8 C)   Resp 18   Ht 5' (1.524 m)   Wt 102 kg   SpO2 98%   BMI 43.92 kg/m  Physical Exam Constitutional:      Appearance: Normal appearance.  HENT:     Head: Normocephalic.     Nose: Nose normal.  Eyes:     Conjunctiva/sclera: Conjunctivae normal.  Cardiovascular:     Rate and Rhythm: Normal rate and regular rhythm.     Pulses: Normal pulses.  Pulmonary:     Effort: Pulmonary effort is normal.     Breath sounds: Normal breath sounds.  Musculoskeletal:       Arms:     Thoracic back: Tenderness present. No swelling, edema, deformity, signs of trauma or bony tenderness. Normal range of motion.  Neurological:     Mental Status: She is alert.  Psychiatric:        Mood and Affect: Mood normal.      ED Results / Procedures / Treatments   Labs (all labs ordered are listed, but only abnormal results are displayed) Labs Reviewed  URINALYSIS, ROUTINE W REFLEX MICROSCOPIC    EKG None  Radiology DG Thoracic Spine 2 View  Result Date: 03/20/2023 CLINICAL DATA:  Upper back pain EXAM: THORACIC SPINE 2 VIEWS COMPARISON:  None Available. FINDINGS: There is no evidence of thoracic spine fracture. Alignment is normal. Mild multilevel spondylosis. IMPRESSION: No acute fracture or traumatic listhesis. Electronically Signed   By: Minerva Fester M.D.   On: 03/20/2023 00:34    Procedures Procedures    Medications Ordered in ED Medications  acetaminophen (TYLENOL) tablet 650 mg (650 mg Oral Given 03/20/23 0033)    ED Course/ Medical Decision Making/ A&P                             Medical Decision Making Amount and/or Complexity of Data Reviewed Labs: ordered. Radiology: ordered.  Risk OTC drugs.   52 year old well-appearing female presenting for back pain and elevated blood pressure.  Exam was unremarkable but did reveal some paraspinal tenderness in the thoracic region.  No red flag symptoms of back pain.  Doubt cauda equina syndrome.  X-ray negative.  Treated with Tylenol.  Advised to continue conservative treatment and recommended NSAIDs for back pain relief.  Also recommended follow-up with her PCP.  Blood pressure is elevated for this encounter.  Did improve after pain medicine.  No symptoms to suggest hypertensive emergency or urgency at this time.  Recommend she take her blood pressure medication as prescribed  and follow-up with her doctor.  Vitals remained stable throughout encounter.  Discussed return precautions. Discharged home in good condition.        Final Clinical Impression(s) / ED Diagnoses Final diagnoses:  Back pain, unspecified back location, unspecified back pain laterality, unspecified chronicity  Elevated blood pressure reading    Rx / DC  Orders ED Discharge Orders     None         Gareth Eagle, PA-C 03/20/23 0049    Gareth Eagle, PA-C 03/20/23 0049    Maia Plan, MD 03/20/23 [phone]

## 2023-03-19 NOTE — ED Triage Notes (Signed)
Pt c/o back pain x 2 weeks and elevated BP today.

## 2023-03-20 ENCOUNTER — Emergency Department (HOSPITAL_COMMUNITY): Payer: 59

## 2023-03-20 DIAGNOSIS — M549 Dorsalgia, unspecified: Secondary | ICD-10-CM | POA: Diagnosis not present

## 2023-03-20 DIAGNOSIS — M47814 Spondylosis without myelopathy or radiculopathy, thoracic region: Secondary | ICD-10-CM | POA: Diagnosis not present

## 2023-03-20 NOTE — Discharge Instructions (Addendum)
Evaluation for your back pain was overall reassuring.  X-ray was negative.  Suspect it is a soft tissue injury.  Recommend conservative treatment at home and you can take ibuprofen or Aleve for pain relief.  Your blood pressure improved significantly on second check.  Recommend he continue taking her blood pressure medications as tolerated.  If you have new chest pain, headache, visual disturbance, shortness of breath or any other concerning symptom please return emergency department for further evaluation.

## 2023-03-25 ENCOUNTER — Other Ambulatory Visit (HOSPITAL_COMMUNITY): Payer: Self-pay

## 2023-03-26 ENCOUNTER — Ambulatory Visit (INDEPENDENT_AMBULATORY_CARE_PROVIDER_SITE_OTHER): Payer: No Typology Code available for payment source

## 2023-03-26 DIAGNOSIS — L501 Idiopathic urticaria: Secondary | ICD-10-CM | POA: Diagnosis not present

## 2023-04-07 ENCOUNTER — Other Ambulatory Visit (HOSPITAL_COMMUNITY): Payer: Self-pay

## 2023-04-14 ENCOUNTER — Other Ambulatory Visit (HOSPITAL_COMMUNITY): Payer: Self-pay

## 2023-04-16 ENCOUNTER — Other Ambulatory Visit (HOSPITAL_COMMUNITY): Payer: Self-pay

## 2023-04-20 ENCOUNTER — Other Ambulatory Visit (HOSPITAL_COMMUNITY): Payer: Self-pay

## 2023-04-20 DIAGNOSIS — E119 Type 2 diabetes mellitus without complications: Secondary | ICD-10-CM | POA: Diagnosis not present

## 2023-04-20 MED ORDER — FLUTICASONE PROPIONATE 50 MCG/ACT NA SUSP
2.0000 | Freq: Every day | NASAL | 3 refills | Status: DC
  Filled 2023-04-20: qty 16, 30d supply, fill #0
  Filled 2023-06-21: qty 16, 30d supply, fill #1

## 2023-04-20 MED ORDER — SERTRALINE HCL 100 MG PO TABS
100.0000 mg | ORAL_TABLET | Freq: Every day | ORAL | 3 refills | Status: DC
  Filled 2023-04-20: qty 90, 90d supply, fill #0

## 2023-04-20 MED ORDER — TRULICITY 0.75 MG/0.5ML ~~LOC~~ SOAJ
0.7500 mg | SUBCUTANEOUS | 3 refills | Status: DC
  Filled 2023-04-20: qty 2, 28d supply, fill #0

## 2023-04-20 MED ORDER — ROSUVASTATIN CALCIUM 20 MG PO TABS
20.0000 mg | ORAL_TABLET | Freq: Every day | ORAL | 3 refills | Status: DC
  Filled 2023-04-20: qty 90, 90d supply, fill #0

## 2023-04-20 MED ORDER — PREMARIN 0.625 MG/GM VA CREA
1.0000 | TOPICAL_CREAM | Freq: Every day | VAGINAL | 1 refills | Status: DC
  Filled 2023-04-20: qty 30, 30d supply, fill #0
  Filled 2023-05-28: qty 30, 30d supply, fill #1

## 2023-04-20 MED ORDER — TRIAMCINOLONE ACETONIDE 0.1 % EX OINT
1.0000 | TOPICAL_OINTMENT | Freq: Every day | CUTANEOUS | 4 refills | Status: DC
  Filled 2023-04-20: qty 30, 30d supply, fill #0
  Filled 2023-06-21: qty 30, 30d supply, fill #1

## 2023-04-20 MED ORDER — ALBUTEROL SULFATE HFA 108 (90 BASE) MCG/ACT IN AERS
2.0000 | INHALATION_SPRAY | RESPIRATORY_TRACT | 3 refills | Status: DC
  Filled 2023-04-20: qty 6.7, 17d supply, fill #0

## 2023-04-20 MED ORDER — VALACYCLOVIR HCL 1 G PO TABS
1000.0000 mg | ORAL_TABLET | Freq: Every day | ORAL | 3 refills | Status: DC
  Filled 2023-04-20: qty 90, 90d supply, fill #0

## 2023-04-20 MED ORDER — HYDROCHLOROTHIAZIDE 12.5 MG PO TABS
12.5000 mg | ORAL_TABLET | Freq: Every day | ORAL | 3 refills | Status: DC
  Filled 2023-04-20: qty 90, 90d supply, fill #0

## 2023-04-20 MED ORDER — CETIRIZINE HCL 10 MG PO TABS
10.0000 mg | ORAL_TABLET | Freq: Every day | ORAL | 3 refills | Status: DC
  Filled 2023-04-20: qty 100, 100d supply, fill #0
  Filled 2023-04-20: qty 90, 90d supply, fill #0
  Filled 2023-07-26: qty 100, 100d supply, fill #1

## 2023-04-20 MED ORDER — LOSARTAN POTASSIUM 50 MG PO TABS
50.0000 mg | ORAL_TABLET | Freq: Every day | ORAL | 3 refills | Status: DC
  Filled 2023-04-20: qty 90, 90d supply, fill #0

## 2023-04-20 MED ORDER — VITAMIN D (ERGOCALCIFEROL) 50000 UNITS PO CAPS
50000.0000 [IU] | ORAL_CAPSULE | ORAL | 3 refills | Status: DC
  Filled 2023-04-20: qty 12, 84d supply, fill #0
  Filled 2023-07-26: qty 12, 84d supply, fill #1

## 2023-04-23 ENCOUNTER — Ambulatory Visit (INDEPENDENT_AMBULATORY_CARE_PROVIDER_SITE_OTHER): Payer: 59 | Admitting: *Deleted

## 2023-04-23 DIAGNOSIS — L501 Idiopathic urticaria: Secondary | ICD-10-CM

## 2023-04-28 ENCOUNTER — Other Ambulatory Visit (HOSPITAL_COMMUNITY): Payer: Self-pay

## 2023-04-28 ENCOUNTER — Other Ambulatory Visit: Payer: Self-pay

## 2023-05-02 NOTE — Progress Notes (Unsigned)
Follow Up Note  RE: [name] MRN: [id number] DOB: 10/11/1970 Date of Office Visit: 05/03/2023  Referring provider: Tylene Fantasia, * Primary care provider: Tylene Fantasia, MD  Chief Complaint: No chief complaint on file.  History of Present Illness: I had the pleasure of seeing Shelley Mccoy for a follow up visit at the [facility] on 05/02/2023. She is a 52 y.o. female, who is being followed for CIU on Xolair, allergic rhinoconjunctivitis and asthma. Her previous allergy office visit was on 11/19/2022 with Thermon Leyland, FNP. Today is a regular follow up visit.  Nausea Begin Zofran 4 mg once every 8 hours as needed for nausea Continue to hydrate as tolerated. Contact your dental provider and primary care provider regarding nausea Check your blood sugar when you get home Call the clinic or call 911 for any worsening of symptoms   Chronic urticaria Continue Xolair injections and have access to an epipen per protocol    Moderate persistent asthma with (acute) exacerbation Past history - Diagnosed with asthma 4 years ago.  2020 spirometry showed: possible restrictive disease and 21% improvement in FEV1 post bronchodilator treatment.   Interim history - coughing with post tussive emesis with current respiratory infection. Coughing much improved after duoneb tx in the office.  Steroid shot given today. Start prednisone taper as above.  Daily controller medication(s): Advair 2 puffs twice a day with spacer and rinse mouth afterwards (PURPLE INHALER). Nebulizer machine given and demonstrated proper use. During respiratory infections/flares:  Start Pulmicort 0.5mg  nebulizer twice a day for 1-2 weeks until your breathing symptoms return to baseline.  Pretreat with albuterol 2 puffs or albuterol nebulizer.  If you need to use your albuterol nebulizer machine back to back within 15-30 minutes with no relief then please go to the ER/urgent care for  further evaluation.  Get spirometry at next visit.    Chronic idiopathic urticaria Past history - Breaking out in rash/hives for the past 3 years.  Urticaria panel unremarkable. Unable to wean to every 5 weeks.  Interim history - doing well with no flares.  Xolair injections 300mg  every 4 weeks - given today. Continue with zyrtec 10mg  daily - also for allergies.  Monitor symptoms.  Avoid the following potential triggers: alcohol, tight clothing, NSAIDs.    Seasonal and perennial allergic rhinoconjunctivitis Past history - Rhinoconjunctivitis symptoms for the past 5 to 10 years mainly during the pollen seasons.  Using Allegra and Atrovent nasal spray with some benefit. 2020 skin testing showed: Positive to ragweed and dust mites, molds and cockroaches. Interim history - controlled.  Continue environmental control measures. May use over the counter antihistamines such as Zyrtec (cetirizine), Claritin (loratadine), Allegra (fexofenadine), or Xyzal (levocetirizine) daily as needed. Use Flonase (fluticasone) nasal spray 1 spray per nostril twice a day as needed for nasal congestion.   Follow up in 1 week or sooner if needed.   No follow-ups on file.  Assessment and Plan: Shelley Mccoy is a 51 y.o. female with: No problem-specific Assessment & Plan notes found for this encounter.  No follow-ups on file.  No orders of the defined types were placed in this encounter.  Lab Orders  No laboratory test(s) ordered today    Diagnostics: Spirometry:  Tracings reviewed. Her effort: {Blank single:19197::"Good reproducible efforts.","It was hard to get consistent efforts and there is a question as to whether this reflects a maximal maneuver.","Poor effort, data can not be interpreted."} FVC: ***L FEV1: ***L, ***% predicted FEV1/FVC ratio: ***%  Interpretation: {Blank single:19197::"Spirometry consistent with mild obstructive disease","Spirometry consistent with moderate obstructive  disease","Spirometry consistent with severe obstructive disease","Spirometry consistent with possible restrictive disease","Spirometry consistent with mixed obstructive and restrictive disease","Spirometry uninterpretable due to technique","Spirometry consistent with normal pattern","No overt abnormalities noted given today's efforts"}.  Please see scanned spirometry results for details.  Skin Testing: {Blank single:19197::"Select foods","Environmental allergy panel","Environmental allergy panel and select foods","Food allergy panel","None","Deferred due to recent antihistamines use"}. *** Results discussed with patient/family.   Medication List:  Current Outpatient Medications  Medication Sig Dispense Refill   albuterol (PROVENTIL) (2.5 MG/3ML) 0.083% nebulizer solution Take 3 mLs (2.5 mg total) by nebulization every 4 (four) hours as needed for wheezing or shortness of breath (coughing fits). 90 mL 1   albuterol (VENTOLIN HFA) 108 (90 Base) MCG/ACT inhaler Inhale 2 puffs into the lungs every 4 (four) hours. 13.4 g 3   albuterol (VENTOLIN HFA) 108 (90 Base) MCG/ACT inhaler Inhale 2 puffs into the lungs every 4 (four) hours. 13.4 g 3   albuterol (VENTOLIN HFA) 108 (90 Base) MCG/ACT inhaler Inhale 2 puffs into the lungs every 4 (four) hours. 13.4 g 3   Ascorbic Acid (VITAMIN C PO) Take 1 tablet by mouth daily.     budesonide (PULMICORT) 0.5 MG/2ML nebulizer solution Take 2 mLs (0.5 mg total) by nebulization in the morning and at bedtime. Take for 1-2 weeks during asthma flare. 120 mL 2   cetirizine (ZYRTEC) 10 MG tablet Take 1 tablet (10 mg total) by mouth daily. 90 tablet 3   cetirizine (ZYRTEC) 10 MG tablet Take 1 tablet (10 mg total) by mouth daily. 90 tablet 3   cetirizine (ZYRTEC) 10 MG tablet Take 1 tablet (10 mg total) by mouth daily. 90 tablet 3   cetirizine (ZYRTEC) 10 MG tablet Take 1 tablet (10 mg total) by mouth daily. 100 tablet 3   conjugated estrogens (PREMARIN) vaginal cream Place  1 Applicatorful vaginally daily. 30 g 1   Continuous Glucose Receiver (DEXCOM G6 RECEIVER) DEVI Use as directed 1 each 0   Continuous Glucose Sensor (DEXCOM G6 SENSOR) MISC Use as directed. (change every 10 days) 3 each 11   Continuous Glucose Transmitter (DEXCOM G6 TRANSMITTER) MISC Use as directed for monitoring blood sugar 1 each 3   cyclobenzaprine (FLEXERIL) 10 MG tablet Take 10 mg by mouth as needed.     Dulaglutide (TRULICITY) 0.75 MG/0.5ML SOPN Inject 0.75 mg into the skin once a week. 2 mL 3   Dulaglutide (TRULICITY) 0.75 MG/0.5ML SOPN Inject 0.75 mg into the skin once a week. 2 mL 3   Dulaglutide (TRULICITY) 0.75 MG/0.5ML SOPN Inject 0.75 mg into the skin once a week. 2 mL 3   EPINEPHrine 0.3 mg/0.3 mL IJ SOAJ injection Inject 0.3 mg into the muscle as needed. 2 each 4   EPINEPHrine 0.3 mg/0.3 mL IJ SOAJ injection Use as directed as needed 2 each 4   estradiol (ESTRACE) 0.1 MG/GM vaginal cream Insert 1 g every day by vaginal route for 14 days, for vaginal irritation. 42.5 g 1   famotidine (PEPCID) 20 MG tablet Take 1 tablet (20 mg total) by mouth 2 (two) times daily. 180 tablet 3   famotidine (PEPCID) 20 MG tablet Take 1 tablet (20 mg total) by mouth 2 (two) times daily. 180 tablet 3   Ferrous Sulfate (IRON PO) Take 1 tablet by mouth daily.     fluticasone (FLONASE) 50 MCG/ACT nasal spray Place 2 sprays into both nostrils daily. 16 g 3   fluticasone (  FLONASE) 50 MCG/ACT nasal spray Place 2 sprays into both nostrils daily. 16 g 3   fluticasone-salmeterol (ADVAIR HFA) 230-21 MCG/ACT inhaler Inhale 2 puffs into the lungs 2 (two) times daily. 12 g 6   fluticasone-salmeterol (ADVAIR) 250-50 MCG/ACT AEPB Inhale 1 puff into the lungs 2 (two) times daily. 60 each 3   gabapentin (NEURONTIN) 300 MG capsule Take 1 capsule (300 mg total) by mouth 3 (three) times daily. 270 capsule 3   gabapentin (NEURONTIN) 300 MG capsule Take 1 capsule (300 mg total) by mouth 3 (three) times daily. 270 capsule 3    glipiZIDE (GLUCOTROL) 10 MG tablet Take 10 mg by mouth daily.     glucose blood (FREESTYLE LITE) test strip Use 1 strip 2 (two) times daily. 150 strip 3   hydrochlorothiazide (HYDRODIURIL) 12.5 MG tablet Take 1 tablet (12.5 mg total) by mouth daily. 90 tablet 3   hydrochlorothiazide (HYDRODIURIL) 12.5 MG tablet Take 1 tablet (12.5 mg total) by mouth daily. 90 tablet 3   hydrochlorothiazide (HYDRODIURIL) 12.5 MG tablet Take 1 tablet (12.5 mg total) by mouth daily. 90 tablet 3   hydrochlorothiazide (HYDRODIURIL) 12.5 MG tablet Take 1 tablet (12.5 mg total) by mouth daily. 90 tablet 3   ibuprofen (ADVIL) 800 MG tablet Take 800 mg by mouth 3 (three) times daily.     insulin degludec (TRESIBA) 100 UNIT/ML FlexTouch Pen Inject 10 Units into the skin daily.     lidocaine (XYLOCAINE) 2 % solution Use as directed 15 mLs in the mouth or throat every 6 (six) hours as needed for mouth pain. 100 mL 0   losartan (COZAAR) 50 MG tablet Take 1 tablet (50 mg total) by mouth daily. 90 tablet 3   losartan (COZAAR) 50 MG tablet Take 1 tablet (50 mg total) by mouth daily. 90 tablet 3   losartan (COZAAR) 50 MG tablet Take 1 tablet (50 mg total) by mouth daily. 90 tablet 3   losartan (COZAAR) 50 MG tablet Take 1 tablet (50 mg total) by mouth daily. 90 tablet 3   omalizumab (XOLAIR) 150 MG/ML prefilled syringe Inject 300 mg into the skin every 28 (twenty-eight) days. 2 mL 11   Omega-3 Fatty Acids (FISH OIL) 1000 MG CAPS Take by mouth.     ondansetron (ZOFRAN) 4 MG tablet Take 1 tablet (4 mg total) by mouth every 8 (eight) hours as needed for nausea or vomiting. 20 tablet 0   pantoprazole (PROTONIX) 40 MG tablet Take by mouth.     rosuvastatin (CRESTOR) 20 MG tablet Take 1 tablet (20 mg total) by mouth daily. 90 tablet 3   rosuvastatin (CRESTOR) 20 MG tablet Take 1 tablet (20 mg total) by mouth daily. 90 tablet 3   rosuvastatin (CRESTOR) 20 MG tablet Take 1 tablet (20 mg total) by mouth daily. 90 tablet 3   rosuvastatin  (CRESTOR) 20 MG tablet Take 1 tablet (20 mg total) by mouth daily. 90 tablet 3   sertraline (ZOLOFT) 100 MG tablet Take 1 tablet (100 mg total) by mouth daily. 90 tablet 3   sertraline (ZOLOFT) 100 MG tablet Take 1 tablet (100 mg total) by mouth daily. 90 tablet 3   sertraline (ZOLOFT) 100 MG tablet Take 1 tablet (100 mg total) by mouth daily. 90 tablet 3   sertraline (ZOLOFT) 100 MG tablet Take 1 tablet (100 mg total) by mouth daily. 90 tablet 3   topiramate (TOPAMAX) 25 MG tablet      triamcinolone ointment (KENALOG) 0.1 % Apply 1 gram  to affected area topically once daily. 30 g 4   triamcinolone ointment (KENALOG) 0.1 % Apply to affected area once daily. 30 g 4   valACYclovir (VALTREX) 1000 MG tablet Take 1 tablet (1,000 mg total) by mouth daily. 90 tablet 3   valACYclovir (VALTREX) 1000 MG tablet Take 1 tablet (1,000 mg total) by mouth daily. 90 tablet 3   valACYclovir (VALTREX) 1000 MG tablet Take 1 tablet (1,000 mg total) by mouth daily. 90 tablet 3   valACYclovir (VALTREX) 1000 MG tablet Take 1 tablet (1,000 mg total) by mouth daily. 90 tablet 3   Vitamin D, Ergocalciferol, (DRISDOL) 1.25 MG (50000 UNIT) CAPS capsule Take 1 capsule (50,000 Units total) by mouth once a week. 13 capsule 3   Vitamin D, Ergocalciferol, 50000 units CAPS Take 50,000 Units by mouth once a week. 13 capsule 3   Vitamin D, Ergocalciferol, 50000 units CAPS Take 1 capsule by mouth once a week. 13 capsule 3   Vitamin D, Ergocalciferol, 50000 units CAPS Take 1 capsule by mouth once a week. 13 capsule 3   Current Facility-Administered Medications  Medication Dose Route Frequency Provider Last Rate Last Admin   omalizumab Geoffry Paradise) injection 300 mg  300 mg Subcutaneous Q28 days Alfonse Spruce, MD   300 mg at 04/23/23 0940   Allergies: Allergies  Allergen Reactions   Percocet [Oxycodone-Acetaminophen] Hives   I reviewed her past medical history, social history, family history, and environmental history and no  significant changes have been reported from her previous visit.  Review of Systems  Constitutional:  Positive for fatigue. Negative for appetite change, chills, fever and unexpected weight change.  HENT:  Positive for congestion and sore throat.   Eyes:  Negative for itching.  Respiratory:  Positive for cough, chest tightness, shortness of breath and wheezing.   Cardiovascular:  Negative for chest pain.  Gastrointestinal:  Negative for abdominal pain.  Genitourinary:  Negative for difficulty urinating.  Skin:  Negative for rash.  Allergic/Immunologic: Positive for environmental allergies. Negative for food allergies.    Objective: There were no vitals taken for this visit. There is no height or weight on file to calculate BMI. Physical Exam Vitals and nursing note reviewed.  Constitutional:      Appearance: Normal appearance. She is well-developed.  HENT:     Head: Normocephalic and atraumatic.     Right Ear: Tympanic membrane and external ear normal.     Left Ear: Tympanic membrane and external ear normal.     Nose: Nose normal.  Eyes:     Conjunctiva/sclera: Conjunctivae normal.  Cardiovascular:     Rate and Rhythm: Normal rate and regular rhythm.     Heart sounds:     No friction rub. No gallop.  Pulmonary:     Effort: Pulmonary effort is normal.     Breath sounds: No wheezing or rales.     Comments: Coughing during exam. Musculoskeletal:     Cervical back: Neck supple.  Skin:    General: Skin is warm.     Findings: No rash.  Neurological:     Mental Status: She is alert and oriented to person, place, and time.  Psychiatric:        Behavior: Behavior normal.    Previous notes and tests were reviewed. The plan was reviewed with the patient/family, and all questions/concerned were addressed.  It was my pleasure to see Shelley Mccoy today and participate in her care. Please feel free to contact me with any questions or  concerns.  Sincerely,  Wyline Mood, DO Allergy &  Immunology  [facility] office: [phone] [facility] office: [phone]

## 2023-05-03 ENCOUNTER — Other Ambulatory Visit: Payer: Self-pay

## 2023-05-03 ENCOUNTER — Ambulatory Visit (INDEPENDENT_AMBULATORY_CARE_PROVIDER_SITE_OTHER): Payer: 59 | Admitting: Allergy

## 2023-05-03 ENCOUNTER — Encounter: Payer: Self-pay | Admitting: Allergy

## 2023-05-03 ENCOUNTER — Other Ambulatory Visit (HOSPITAL_COMMUNITY): Payer: Self-pay

## 2023-05-03 VITALS — BP 140/82 | HR 70 | Temp 98.1°F | Ht 60.0 in | Wt 228.6 lb

## 2023-05-03 DIAGNOSIS — R0609 Other forms of dyspnea: Secondary | ICD-10-CM

## 2023-05-03 DIAGNOSIS — Z91038 Other insect allergy status: Secondary | ICD-10-CM | POA: Diagnosis not present

## 2023-05-03 DIAGNOSIS — H1013 Acute atopic conjunctivitis, bilateral: Secondary | ICD-10-CM

## 2023-05-03 DIAGNOSIS — L501 Idiopathic urticaria: Secondary | ICD-10-CM

## 2023-05-03 DIAGNOSIS — J302 Other seasonal allergic rhinitis: Secondary | ICD-10-CM

## 2023-05-03 DIAGNOSIS — J301 Allergic rhinitis due to pollen: Secondary | ICD-10-CM

## 2023-05-03 DIAGNOSIS — J3089 Other allergic rhinitis: Secondary | ICD-10-CM

## 2023-05-03 DIAGNOSIS — J4540 Moderate persistent asthma, uncomplicated: Secondary | ICD-10-CM | POA: Diagnosis not present

## 2023-05-03 DIAGNOSIS — H1010 Acute atopic conjunctivitis, unspecified eye: Secondary | ICD-10-CM

## 2023-05-03 MED ORDER — TRELEGY ELLIPTA 200-62.5-25 MCG/ACT IN AEPB
1.0000 | INHALATION_SPRAY | Freq: Every day | RESPIRATORY_TRACT | 3 refills | Status: DC
  Filled 2023-05-03: qty 60, 30d supply, fill #0
  Filled 2023-05-28: qty 60, 30d supply, fill #1
  Filled 2023-06-21 (×2): qty 60, 30d supply, fill #2
  Filled 2023-07-26: qty 60, 30d supply, fill #3

## 2023-05-03 MED ORDER — ALBUTEROL SULFATE HFA 108 (90 BASE) MCG/ACT IN AERS
2.0000 | INHALATION_SPRAY | RESPIRATORY_TRACT | 1 refills | Status: DC | PRN
  Filled 2023-05-03: qty 6.7, 25d supply, fill #0
  Filled 2023-07-26: qty 6.7, 25d supply, fill #1

## 2023-05-03 MED ORDER — ALBUTEROL SULFATE (2.5 MG/3ML) 0.083% IN NEBU
2.5000 mg | INHALATION_SOLUTION | RESPIRATORY_TRACT | 1 refills | Status: DC | PRN
  Filled 2023-05-03: qty 75, 5d supply, fill #0
  Filled 2023-07-26: qty 75, 5d supply, fill #1

## 2023-05-03 NOTE — Patient Instructions (Addendum)
Asthma  Daily controller medication(s): Trelegy 200mg  1 puff once a day. Sample given and demonstrated proper use.  STOP PURPLE inhaler.  During respiratory infections/flares:  Start budesonide nebulizer twice a day for 1-2 weeks until your breathing symptoms return to baseline.  Pretreat with albuterol 2 puffs or albuterol nebulizer.  If you need to use your albuterol nebulizer machine back to back within 15-30 minutes with no relief then please go to the ER/urgent care for further evaluation.  May use albuterol rescue inhaler 2 puffs or nebulizer every 4 to 6 hours as needed for shortness of breath, chest tightness, coughing, and wheezing. May use albuterol rescue inhaler 2 puffs 5 to 15 minutes prior to strenuous physical activities. Monitor frequency of use - if you need to use it more than twice per week on a consistent basis let us know.  Breathing control goals:  Full participation in all desired activities (may need albuterol before activity) Albuterol use two times or less a week on average (not counting use with activity) Cough interfering with sleep two times or less a month Oral steroids no more than once a year No hospitalizations  Chronic urticaria Xolair injections 300mg  every 4 weeks.  Continue with zyrtec 10mg  daily - also for allergies.  Monitor symptoms.  Avoid the following potential triggers: alcohol, tight clothing, NSAIDs.    Seasonal and perennial allergic rhinoconjunctivitis 2020 skin testing showed: Positive to ragweed and dust mites, molds and cockroaches. Continue environmental control measures. May use over the counter antihistamines such as Zyrtec (cetirizine), Claritin (loratadine), Allegra (fexofenadine), or Xyzal (levocetirizine) daily as needed. Use Flonase (fluticasone) nasal spray 1 spray per nostril twice a day as needed for nasal congestion.   Make sure to see PCP regarding your shortness of breath.  Follow up in 2 months or sooner if needed.

## 2023-05-04 ENCOUNTER — Other Ambulatory Visit (HOSPITAL_COMMUNITY): Payer: Self-pay

## 2023-05-12 ENCOUNTER — Other Ambulatory Visit (HOSPITAL_COMMUNITY): Payer: Self-pay

## 2023-05-13 ENCOUNTER — Other Ambulatory Visit (HOSPITAL_COMMUNITY): Payer: Self-pay

## 2023-05-17 ENCOUNTER — Other Ambulatory Visit (HOSPITAL_COMMUNITY): Payer: Self-pay

## 2023-05-21 ENCOUNTER — Ambulatory Visit: Payer: 59

## 2023-05-24 ENCOUNTER — Other Ambulatory Visit (HOSPITAL_COMMUNITY): Payer: Self-pay

## 2023-05-24 ENCOUNTER — Other Ambulatory Visit: Payer: Self-pay

## 2023-05-24 ENCOUNTER — Ambulatory Visit (INDEPENDENT_AMBULATORY_CARE_PROVIDER_SITE_OTHER): Payer: 59 | Admitting: *Deleted

## 2023-05-24 DIAGNOSIS — L501 Idiopathic urticaria: Secondary | ICD-10-CM | POA: Diagnosis not present

## 2023-05-28 ENCOUNTER — Other Ambulatory Visit (HOSPITAL_COMMUNITY): Payer: Self-pay

## 2023-05-28 ENCOUNTER — Other Ambulatory Visit: Payer: Self-pay

## 2023-06-08 ENCOUNTER — Other Ambulatory Visit (HOSPITAL_COMMUNITY): Payer: Self-pay

## 2023-06-10 ENCOUNTER — Other Ambulatory Visit (HOSPITAL_COMMUNITY): Payer: Self-pay

## 2023-06-19 ENCOUNTER — Encounter (HOSPITAL_COMMUNITY): Payer: Self-pay

## 2023-06-21 ENCOUNTER — Other Ambulatory Visit: Payer: Self-pay | Admitting: Allergy

## 2023-06-21 ENCOUNTER — Ambulatory Visit (INDEPENDENT_AMBULATORY_CARE_PROVIDER_SITE_OTHER): Payer: 59 | Admitting: *Deleted

## 2023-06-21 ENCOUNTER — Other Ambulatory Visit (HOSPITAL_COMMUNITY): Payer: Self-pay

## 2023-06-21 DIAGNOSIS — L501 Idiopathic urticaria: Secondary | ICD-10-CM

## 2023-06-21 MED ORDER — BUDESONIDE 0.5 MG/2ML IN SUSP
0.5000 mg | Freq: Two times a day (BID) | RESPIRATORY_TRACT | 2 refills | Status: DC
  Filled 2023-06-21: qty 120, 30d supply, fill #0
  Filled 2023-07-26: qty 120, 30d supply, fill #1
  Filled 2023-09-23: qty 120, 30d supply, fill #2

## 2023-06-22 ENCOUNTER — Other Ambulatory Visit (HOSPITAL_COMMUNITY): Payer: Self-pay

## 2023-06-22 MED ORDER — TRULICITY 0.75 MG/0.5ML ~~LOC~~ SOAJ
0.7500 mg | SUBCUTANEOUS | 3 refills | Status: DC
  Filled 2023-06-22: qty 2, 28d supply, fill #0

## 2023-06-22 MED ORDER — PREMARIN 0.625 MG/GM VA CREA
1.0000 | TOPICAL_CREAM | Freq: Every day | VAGINAL | 1 refills | Status: AC
  Filled 2023-06-22: qty 30, 15d supply, fill #0
  Filled 2023-07-26: qty 30, 15d supply, fill #1

## 2023-06-28 ENCOUNTER — Emergency Department (HOSPITAL_COMMUNITY): Payer: 59

## 2023-06-28 ENCOUNTER — Other Ambulatory Visit: Payer: Self-pay

## 2023-06-28 ENCOUNTER — Encounter (HOSPITAL_COMMUNITY): Payer: Self-pay

## 2023-06-28 ENCOUNTER — Emergency Department (HOSPITAL_COMMUNITY)
Admission: EM | Admit: 2023-06-28 | Discharge: 2023-06-28 | Disposition: A | Discharge status: Home / Self Care | Payer: 59 | Attending: Emergency Medicine | Admitting: Emergency Medicine

## 2023-06-28 DIAGNOSIS — R4781 Slurred speech: Secondary | ICD-10-CM | POA: Insufficient documentation

## 2023-06-28 DIAGNOSIS — F449 Dissociative and conversion disorder, unspecified: Secondary | ICD-10-CM | POA: Diagnosis not present

## 2023-06-28 DIAGNOSIS — R2990 Unspecified symptoms and signs involving the nervous system: Secondary | ICD-10-CM

## 2023-06-28 DIAGNOSIS — R791 Abnormal coagulation profile: Secondary | ICD-10-CM | POA: Insufficient documentation

## 2023-06-28 DIAGNOSIS — R200 Anesthesia of skin: Secondary | ICD-10-CM | POA: Insufficient documentation

## 2023-06-28 DIAGNOSIS — R29810 Facial weakness: Secondary | ICD-10-CM | POA: Insufficient documentation

## 2023-06-28 LAB — CBC
HCT: 38.3 % (ref 36.0–46.0)
Hemoglobin: 12.1 g/dL (ref 12.0–15.0)
MCH: 23.1 pg — ABNORMAL LOW (ref 26.0–34.0)
MCHC: 31.6 g/dL (ref 30.0–36.0)
MCV: 73.1 fL — ABNORMAL LOW (ref 80.0–100.0)
Platelets: 315 10*3/uL (ref 150–400)
RBC: 5.24 MIL/uL — ABNORMAL HIGH (ref 3.87–5.11)
RDW: 16 % — ABNORMAL HIGH (ref 11.5–15.5)
WBC: 5.9 10*3/uL (ref 4.0–10.5)
nRBC: 0 % (ref 0.0–0.2)

## 2023-06-28 LAB — DIFFERENTIAL
Abs Immature Granulocytes: 0.01 10*3/uL (ref 0.00–0.07)
Basophils Absolute: 0 10*3/uL (ref 0.0–0.1)
Basophils Relative: 1 %
Eosinophils Absolute: 0.1 10*3/uL (ref 0.0–0.5)
Eosinophils Relative: 1 %
Immature Granulocytes: 0 %
Lymphocytes Relative: 53 %
Lymphs Abs: 3.1 10*3/uL (ref 0.7–4.0)
Monocytes Absolute: 0.3 10*3/uL (ref 0.1–1.0)
Monocytes Relative: 5 %
Neutro Abs: 2.4 10*3/uL (ref 1.7–7.7)
Neutrophils Relative %: 40 %

## 2023-06-28 LAB — COMPREHENSIVE METABOLIC PANEL
ALT: 20 U/L (ref 0–44)
AST: 19 U/L (ref 15–41)
Albumin: 3.7 g/dL (ref 3.5–5.0)
Alkaline Phosphatase: 126 U/L (ref 38–126)
Anion gap: 10 (ref 5–15)
BUN: 13 mg/dL (ref 6–20)
CO2: 23 mmol/L (ref 22–32)
Calcium: 9.2 mg/dL (ref 8.9–10.3)
Chloride: 106 mmol/L (ref 98–111)
Creatinine, Ser: 0.75 mg/dL (ref 0.44–1.00)
GFR, Estimated: >60 mL/min (ref >60)
Glucose, Bld: 105 mg/dL — ABNORMAL HIGH (ref 70–99)
Potassium: 4.3 mmol/L (ref 3.5–5.1)
Sodium: 139 mmol/L (ref 135–145)
Total Bilirubin: 0.9 mg/dL (ref 0.3–1.2)
Total Protein: 7.5 g/dL (ref 6.5–8.1)

## 2023-06-28 LAB — CBG MONITORING, ED: Glucose-Capillary: 102 mg/dL — ABNORMAL HIGH (ref 70–99)

## 2023-06-28 LAB — PROTIME-INR
INR: 1 (ref 0.8–1.2)
Prothrombin Time: 13.8 s (ref 11.4–15.2)

## 2023-06-28 LAB — APTT: aPTT: 28 s (ref 24–36)

## 2023-06-28 LAB — I-STAT CHEM 8, ED
BUN: 17 mg/dL (ref 6–20)
Calcium, Ion: 1.15 mmol/L (ref 1.15–1.40)
Chloride: 107 mmol/L (ref 98–111)
Creatinine, Ser: 0.7 mg/dL (ref 0.44–1.00)
Glucose, Bld: 101 mg/dL — ABNORMAL HIGH (ref 70–99)
HCT: 39 % (ref 36.0–46.0)
Hemoglobin: 13.3 g/dL (ref 12.0–15.0)
Potassium: 4.4 mmol/L (ref 3.5–5.1)
Sodium: 140 mmol/L (ref 135–145)
TCO2: 25 mmol/L (ref 22–32)

## 2023-06-28 LAB — ETHANOL: Alcohol, Ethyl (B): <10 mg/dL (ref <10)

## 2023-06-28 NOTE — ED Provider Triage Note (Signed)
Emergency Medicine Provider Triage Evaluation Note  Shelley Mccoy , a 52 y.o. female  was evaluated in triage.  Pt complains of left-sided facial droop, left-sided facial numbness, slurred speech.  Patient states that symptoms began insidiously around 4 PM this afternoon.  Denies history of stroke.  Denies any weakness or sensory deficits in the lower extremities, gait abnormality, visual disturbance.  Review of Systems  Positive: See above Negative:   Physical Exam  BP 108/60   Pulse 77   Temp 98.4 F (36.9 C)   Resp 18   SpO2 100%  Gen:   Awake, no distress   Resp:  Normal effort  MSK:   Moves extremities without difficulty  Other:  Slurring of speech present.  Left-sided facial droop with sparing of the forehead.  Decreased sensation subjectively on left side of face.    Medical Decision Making  Medically screening exam initiated at 5:53 PM.  Appropriate orders placed.  Shelley Mccoy was informed that the remainder of the evaluation will be completed by another provider, this initial triage assessment does not replace that evaluation, and the importance of remaining in the ED until their evaluation is complete.  Code stroke activated   Shelley Mccoy, [place] 06/28/23 1755

## 2023-06-28 NOTE — ED Notes (Signed)
Pt arrives to Room 6 from MRI.

## 2023-06-28 NOTE — ED Triage Notes (Signed)
Pt c/o left sided facial numbness, left facial droop an slurred speech. LKW 1600 today.

## 2023-06-28 NOTE — Discharge Instructions (Addendum)
Please follow-up with your primary care provider regarding your symptoms.  Your MRI imaging was overall reassuring and neurology was not concerned for stroke.  You do not have evidence of Bell's palsy on exam.

## 2023-06-28 NOTE — Consult Note (Signed)
Stroke Neurology Consultation Note  Consult Requested by: Dr Karene Fry  Reason for Consult: code stroke  Consult Date: 06/28/23   The history was obtained from the pt and ED staff and chart.  During history and examination, all items were able to obtain unless otherwise noted.  History of Present Illness:  Shelley Mccoy is a 52 y.o. African American female with PMH of HTN, DM, anemia, conversion disorder presented as code stroke. 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 She said she came in to work at [employer] today, feeling normal. And then all of sudden she started to have facial droop and slurry speech and left sided numbness. Time onset around 4pm. She was sent to ER, code stroke activated. On exam, pt had stuttered speech with mouth twisted to the right making left facial asymmetry. RUE giveaway weakness but distractable. RLE giveaway weakness also noted. CT no acute finding. Stat MRI done no acute infarct.   Pt had hx of conversion disorder. In 10/2017 she was admitted for right facial droop and dysarthria. tPA was given. However, she was diagnosed with conversion disorder on discharge. She was again presented to ED in 12/2017 with left sided weakness and stuttering speech but distractable, again suspect psychogenic and MRI negative. Presented to ED again in 11/2018 with facial droop and dysarthria but felt again psychogenic in nature. She was then followed with Dr. Terrace Arabia at [facility] for RLE weakness and gait difficulty, EEG negative, and felt to be functional.   LSN: 4pm tPA Given: No: functional component, MRI negative  Past Medical History:  Diagnosis Date   Anemia    Angioedema of lips 03/22/2019   Asthma    Bell's palsy    Chronic urticaria 03/22/2019   Conversion disorder    Diabetes mellitus without complication (HCC)    Functional gait abnormality    Heart murmur    History of uterine fibroid    HSV-2 (herpes simplex virus 2) infection    Hypertension    Seasonal allergies    Sleep apnea    no cpap     Past Surgical History:  Procedure Laterality Date   BREAST CYST EXCISION Left    CYSTECTOMY     hand and breast   LAPAROSCOPIC ABDOMINAL EXPLORATION     ORIF ANKLE FRACTURE Left 10/31/2020   Procedure: OPEN TREATMENT OF LEFT LATERAL MALLEOLUS, DELTOID LIGAMENT RECONSTRUCTION AND SYNDESMOSIS;  Surgeon: Terance Hart, MD;  Location: [facility];  Service: Orthopedics;  Laterality: Left;  LENGTH OF SURGERY: 90 MIN   TOTAL ABDOMINAL HYSTERECTOMY      Family History  Problem Relation Age of Onset   Diabetes Mother    Congestive Heart Failure Mother    Kidney failure Mother    Hypertension Mother    Asthma Mother    Prostate cancer Father    Hypertension Sister    Diabetes Maternal Grandmother    Congestive Heart Failure Maternal Grandmother    Kidney failure Maternal Grandmother    Alzheimer's disease Paternal Grandmother     Social History:  reports that she has never smoked. She has never used smokeless tobacco. She reports that she does not drink alcohol and does not use drugs.  Allergies:  Allergies  Allergen Reactions   Percocet [Oxycodone-Acetaminophen] Hives    Current Facility-Administered Medications on File Prior to Encounter  Medication Dose Route Frequency Provider Last Rate Last Admin   omalizumab Geoffry Paradise) injection 300 mg  300 mg Subcutaneous Q28 days Alfonse Spruce, MD  300 mg at 06/21/23 1354   Current Outpatient Medications on File Prior to Encounter  Medication Sig Dispense Refill   albuterol (PROVENTIL) (2.5 MG/3ML) 0.083% nebulizer solution Take 3 mLs (2.5 mg total) by nebulization every 4 (four) hours as needed for wheezing or shortness of breath (coughing fits). 75 mL 1   albuterol (VENTOLIN HFA) 108 (90 Base) MCG/ACT inhaler Inhale 2 puffs into the lungs every 4 (four) hours as needed for wheezing or shortness of breath (coughing fits). 6.7 g 1   Ascorbic Acid (VITAMIN C PO) Take 1 tablet by mouth daily.     budesonide  (PULMICORT) 0.5 MG/2ML nebulizer solution Inhale 2 mLs (0.5 mg) by nebulization in the morning and at bedtime. Take for 1-2 weeks during asthma flare. 120 mL 2   cetirizine (ZYRTEC) 10 MG tablet Take 1 tablet (10 mg total) by mouth daily. 90 tablet 3   cetirizine (ZYRTEC) 10 MG tablet Take 1 tablet (10 mg total) by mouth daily. 90 tablet 3   cetirizine (ZYRTEC) 10 MG tablet Take 1 tablet (10 mg total) by mouth daily. 90 tablet 3   cetirizine (ZYRTEC) 10 MG tablet Take 1 tablet (10 mg total) by mouth daily. 100 tablet 3   conjugated estrogens (PREMARIN) vaginal cream Place 1 Applicatorful vaginally daily. 30 g 1   Continuous Glucose Receiver (DEXCOM G6 RECEIVER) DEVI Use as directed 1 each 0   Continuous Glucose Sensor (DEXCOM G6 SENSOR) MISC Use as directed. (change every 10 days) 3 each 11   Continuous Glucose Transmitter (DEXCOM G6 TRANSMITTER) MISC Use as directed for monitoring blood sugar 1 each 3   cyclobenzaprine (FLEXERIL) 10 MG tablet Take 10 mg by mouth as needed.     Dulaglutide (TRULICITY) 0.75 MG/0.5ML SOPN Inject 0.75 mg into the skin once a week. 2 mL 3   Dulaglutide (TRULICITY) 0.75 MG/0.5ML SOPN Inject 0.75 mg into the skin once a week. 2 mL 3   Dulaglutide (TRULICITY) 0.75 MG/0.5ML SOPN Inject 0.75 mg into the skin once a week. 2 mL 3   EPINEPHrine 0.3 mg/0.3 mL IJ SOAJ injection Inject 0.3 mg into the muscle as needed. 2 each 4   EPINEPHrine 0.3 mg/0.3 mL IJ SOAJ injection Use as directed as needed 2 each 4   estradiol (ESTRACE) 0.1 MG/GM vaginal cream Insert 1 g every day by vaginal route for 14 days, for vaginal irritation. 42.5 g 1   famotidine (PEPCID) 20 MG tablet Take 1 tablet (20 mg total) by mouth 2 (two) times daily. 180 tablet 3   famotidine (PEPCID) 20 MG tablet Take 1 tablet (20 mg total) by mouth 2 (two) times daily. 180 tablet 3   Ferrous Sulfate (IRON PO) Take 1 tablet by mouth daily.     fluticasone (FLONASE) 50 MCG/ACT nasal spray Place 2 sprays into both  nostrils daily. 16 g 3   fluticasone (FLONASE) 50 MCG/ACT nasal spray Place 2 sprays into both nostrils daily. 16 g 3   Fluticasone-Umeclidin-Vilant (TRELEGY ELLIPTA) 200-62.5-25 MCG/ACT AEPB Inhale 1 puff into the lungs daily. Rinse mouth after each use. 60 each 3   gabapentin (NEURONTIN) 300 MG capsule Take 1 capsule (300 mg total) by mouth 3 (three) times daily. 270 capsule 3   gabapentin (NEURONTIN) 300 MG capsule Take 1 capsule (300 mg total) by mouth 3 (three) times daily. 270 capsule 3   glipiZIDE (GLUCOTROL) 10 MG tablet Take 10 mg by mouth daily.     glucose blood (FREESTYLE LITE) test strip Use 1  strip 2 (two) times daily. 150 strip 3   hydrochlorothiazide (HYDRODIURIL) 12.5 MG tablet Take 1 tablet (12.5 mg total) by mouth daily. 90 tablet 3   hydrochlorothiazide (HYDRODIURIL) 12.5 MG tablet Take 1 tablet (12.5 mg total) by mouth daily. 90 tablet 3   hydrochlorothiazide (HYDRODIURIL) 12.5 MG tablet Take 1 tablet (12.5 mg total) by mouth daily. 90 tablet 3   hydrochlorothiazide (HYDRODIURIL) 12.5 MG tablet Take 1 tablet (12.5 mg total) by mouth daily. 90 tablet 3   ibuprofen (ADVIL) 800 MG tablet Take 800 mg by mouth 3 (three) times daily.     insulin degludec (TRESIBA) 100 UNIT/ML FlexTouch Pen Inject 10 Units into the skin daily.     lidocaine (XYLOCAINE) 2 % solution Use as directed 15 mLs in the mouth or throat every 6 (six) hours as needed for mouth pain. 100 mL 0   losartan (COZAAR) 50 MG tablet Take 1 tablet (50 mg total) by mouth daily. 90 tablet 3   losartan (COZAAR) 50 MG tablet Take 1 tablet (50 mg total) by mouth daily. 90 tablet 3   losartan (COZAAR) 50 MG tablet Take 1 tablet (50 mg total) by mouth daily. 90 tablet 3   losartan (COZAAR) 50 MG tablet Take 1 tablet (50 mg total) by mouth daily. 90 tablet 3   omalizumab (XOLAIR) 150 MG/ML prefilled syringe Inject 300 mg into the skin every 28 (twenty-eight) days. 2 mL 11   Omega-3 Fatty Acids (FISH OIL) 1000 MG CAPS Take by  mouth.     ondansetron (ZOFRAN) 4 MG tablet Take 1 tablet (4 mg total) by mouth every 8 (eight) hours as needed for nausea or vomiting. 20 tablet 0   pantoprazole (PROTONIX) 40 MG tablet Take by mouth.     rosuvastatin (CRESTOR) 20 MG tablet Take 1 tablet (20 mg total) by mouth daily. 90 tablet 3   rosuvastatin (CRESTOR) 20 MG tablet Take 1 tablet (20 mg total) by mouth daily. 90 tablet 3   rosuvastatin (CRESTOR) 20 MG tablet Take 1 tablet (20 mg total) by mouth daily. 90 tablet 3   rosuvastatin (CRESTOR) 20 MG tablet Take 1 tablet (20 mg total) by mouth daily. 90 tablet 3   sertraline (ZOLOFT) 100 MG tablet Take 1 tablet (100 mg total) by mouth daily. 90 tablet 3   sertraline (ZOLOFT) 100 MG tablet Take 1 tablet (100 mg total) by mouth daily. 90 tablet 3   sertraline (ZOLOFT) 100 MG tablet Take 1 tablet (100 mg total) by mouth daily. 90 tablet 3   sertraline (ZOLOFT) 100 MG tablet Take 1 tablet (100 mg total) by mouth daily. 90 tablet 3   topiramate (TOPAMAX) 25 MG tablet      triamcinolone ointment (KENALOG) 0.1 % Apply 1 gram to affected area topically once daily. 30 g 4   triamcinolone ointment (KENALOG) 0.1 % Apply to affected area once daily. 30 g 4   valACYclovir (VALTREX) 1000 MG tablet Take 1 tablet (1,000 mg total) by mouth daily. 90 tablet 3   valACYclovir (VALTREX) 1000 MG tablet Take 1 tablet (1,000 mg total) by mouth daily. 90 tablet 3   valACYclovir (VALTREX) 1000 MG tablet Take 1 tablet (1,000 mg total) by mouth daily. 90 tablet 3   valACYclovir (VALTREX) 1000 MG tablet Take 1 tablet (1,000 mg total) by mouth daily. 90 tablet 3   Vitamin D, Ergocalciferol, (DRISDOL) 1.25 MG (50000 UNIT) CAPS capsule Take 1 capsule (50,000 Units total) by mouth once a week. 13 capsule  3   Vitamin D, Ergocalciferol, 50000 units CAPS Take 50,000 Units by mouth once a week. 13 capsule 3   Vitamin D, Ergocalciferol, 50000 units CAPS Take 1 capsule by mouth once a week. 13 capsule 3   Vitamin D,  Ergocalciferol, 50000 units CAPS Take 1 capsule by mouth once a week. 13 capsule 3    Review of Systems: A full ROS was attempted today and was able to be performed.  Systems assessed include - Constitutional, Eyes, HENT, Respiratory, Cardiovascular, Gastrointestinal, Genitourinary, Integument/breast, Hematologic/lymphatic, Musculoskeletal, Neurological, Behavioral/Psych, Endocrine, Allergic/Immunologic - with pertinent responses as per HPI.  Physical Examination: Temp:  [98.4 F (36.9 C)] 98.4 F (36.9 C) (09/30 1750) Pulse Rate:  [77] 77 (09/30 1750) Resp:  [18] 18 (09/30 1750) BP: (108)/(60) 108/60 (09/30 1750) SpO2:  [100 %] 100 % (09/30 1750) Weight:  [103.4 kg] 103.4 kg (09/30 1800)  General - well nourished, well developed, anxious and crying in CT.    Ophthalmologic - fundi not visualized due to noncooperation.    Cardiovascular - regular rhythm and rate  Neuro - awake, alert, eyes open, orientated to age, place, did not answer for month. No aphasia, moderate slurry speech, following all simple commands. No gaze palsy, tracking bilaterally, blinking to visual threat bilatearlly. Mouth twisted to right, with right mouth corner upward deviation. Tongue protrusion not cooperative. Bilateral UEs 5/5, no drift. RLE 5/5, LLE giveaway weakness. Sensation symmetrical bilaterally, b/l FTN intact, gait not tested.      Data Reviewed: CT HEAD CODE STROKE WO CONTRAST  Result Date: 06/28/2023 CLINICAL DATA:  Code stroke. Neuro deficit, acute, stroke suspected. Left-sided weakness. Left facial droop. EXAM: CT HEAD WITHOUT CONTRAST TECHNIQUE: Contiguous axial images were obtained from the base of the skull through the vertex without intravenous contrast. RADIATION DOSE REDUCTION: This exam was performed according to the departmental dose-optimization program which includes automated exposure control, adjustment of the mA and/or kV according to patient size and/or use of iterative reconstruction  technique. COMPARISON:  CT head without contrast 12/18/2018. MR head without contrast 01/10/2020 FINDINGS: Brain: No acute infarct, hemorrhage, or mass lesion is present. Insert normal D matter No significant white matter lesions are present. Deep brain nuclei are within normal limits. The ventricles are of normal size. No significant extraaxial fluid collection is present. The brainstem and cerebellum are within normal limits. Midline structures are within normal limits. Vascular: No hyperdense vessel or unexpected calcification. Skull: Calvarium is intact. No focal lytic or blastic lesions are present. No significant extracranial soft tissue lesion is present. Sinuses/Orbits: The paranasal sinuses and mastoid air cells are clear. The globes and orbits are within normal limits. ASPECTS [facility] Stroke Program Early CT Score) - Ganglionic level infarction (caudate, lentiform nuclei, internal capsule, insula, M1-M3 cortex): 7/7 - Supraganglionic infarction (M4-M6 cortex): 3/3 Total score (0-10 with 10 being normal): 10/10 IMPRESSION: 1. Negative CT of the head. 2. Aspects is 10/10. The above was relayed via text pager to Dr. Roda Shutters on 06/28/2023 at 18:12 . Electronically Signed   By: Marin Roberts M.D.   On: 06/28/2023 18:12    Assessment: 52 y.o. female with PMH of HTN, DM, anemia, conversion disorder presented as facial droop and slurry speech and left sided numbness. Time onset around 4pm. On exam, pt had stuttered speech with mouth twisted to the right making left facial asymmetry and right mouth corner upward deviation. RUE giveaway weakness but distractable. RLE giveaway weakness also noted. CT no acute finding. Stat MRI done no acute  infarct.   Pt symptoms concerning again for conversion disorder in the setting of hx of conversion disorder. Not TNK candidate given above concern. Will check with MRI brain.   Plan: MRI brain and MRA head If positive for stroke, will need further work up If negative,  can be discharged from ED from neuro standpoint if pt can ambulate in ER without difficulty. Otherwise, observation overnight If negative, pt will need psychology and psychiatry follow up as outpt  Discussed with Dr. Karene Fry ED physician We will follow  Thank you for this consultation and allowing [place] to participate in the care of this patient.  Marvel Plan, MD PhD Stroke Neurology 06/28/2023 7:30 PM

## 2023-06-28 NOTE — ED Provider Notes (Signed)
[place] EMERGENCY DEPARTMENT AT [facility] Provider Note   CSN: [id number] Arrival date & time: 06/28/23  1747     History  Chief Complaint  Patient presents with   Code Stroke    Shelley Mccoy is a 52 y.o. female.  HPI   52 year old female presenting to the emergency department as a code stroke.  She came to work at 3 PM today then experienced sudden onset facial droop with slurred speech and left-sided numbness.  Time of onset was around 4 PM.  She was sent to the emergency room as a code stroke.  Airway was cleared on patient arrival to the ER and the patient was taken to the CT scanner for code stroke imaging.  Neurology was bedside for evaluation.  Home Medications Prior to Admission medications   Medication Sig Start Date End Date Taking? Authorizing Provider  albuterol (PROVENTIL) (2.5 MG/3ML) 0.083% nebulizer solution Take 3 mLs (2.5 mg total) by nebulization every 4 (four) hours as needed for wheezing or shortness of breath (coughing fits). 05/03/23  Yes Ellamae Sia, DO  albuterol (VENTOLIN HFA) 108 (90 Base) MCG/ACT inhaler Inhale 2 puffs into the lungs every 4 (four) hours as needed for wheezing or shortness of breath (coughing fits). 05/03/23  Yes Ellamae Sia, DO  Ascorbic Acid (VITAMIN C PO) Take 1 tablet by mouth daily.   Yes [provider]  budesonide (PULMICORT) 0.5 MG/2ML nebulizer solution Inhale 2 mLs (0.5 mg) by nebulization in the morning and at bedtime. Take for 1-2 weeks during asthma flare. 06/21/23  Yes Ellamae Sia, DO  cetirizine (ZYRTEC) 10 MG tablet Take 1 tablet (10 mg total) by mouth daily. 04/20/23  Yes   conjugated estrogens (PREMARIN) vaginal cream Place 1 Applicatorful vaginally daily. Patient taking differently: Place 1 Application vaginally See admin instructions. Insert 1 Applicatorful into the vagina weekly for vaginal irritation 06/22/23  Yes   Continuous Glucose Receiver (DEXCOM G6 RECEIVER) DEVI Use as directed 01/01/23  Yes    Continuous Glucose Sensor (DEXCOM G6 SENSOR) MISC Use as directed. (change every 10 days) 01/01/23  Yes   Continuous Glucose Transmitter (DEXCOM G6 TRANSMITTER) MISC Use as directed for monitoring blood sugar 01/01/23  Yes   Dulaglutide (TRULICITY) 0.75 MG/0.5ML SOPN Inject 0.75 mg into the skin once a week. Patient taking differently: Inject 0.75 mg into the skin once a week. Sunday 01/01/23  Yes   EPINEPHrine 0.3 mg/0.3 mL IJ SOAJ injection Use as directed as needed 12/25/22  Yes   famotidine (PEPCID) 20 MG tablet Take 1 tablet (20 mg total) by mouth 2 (two) times daily. 08/06/22  Yes   Ferrous Sulfate (IRON PO) Take 1 tablet by mouth daily.   Yes [provider]  fluticasone (FLONASE) 50 MCG/ACT nasal spray Place 2 sprays into both nostrils daily. 01/01/23  Yes   Fluticasone-Umeclidin-Vilant (TRELEGY ELLIPTA) 200-62.5-25 MCG/ACT AEPB Inhale 1 puff into the lungs daily. Rinse mouth after each use. 05/03/23  Yes Ellamae Sia, DO  gabapentin (NEURONTIN) 300 MG capsule Take 1 capsule (300 mg total) by mouth 3 (three) times daily. 08/06/22  Yes   hydrochlorothiazide (HYDRODIURIL) 12.5 MG tablet Take 1 tablet (12.5 mg total) by mouth daily. 08/06/22  Yes   losartan (COZAAR) 50 MG tablet Take 1 tablet (50 mg total) by mouth daily. 08/06/22  Yes   omalizumab Geoffry Paradise) 150 MG/ML prefilled syringe Inject 300 mg into the skin every 28 (twenty-eight) days. 02/23/23  Yes Quentin Angst, MD  Omega-3 Fatty Acids (FISH OIL) 1000 MG CAPS Take 1,000 mg by mouth daily.   Yes [provider]  rosuvastatin (CRESTOR) 20 MG tablet Take 1 tablet (20 mg total) by mouth daily. 08/06/22  Yes   sertraline (ZOLOFT) 100 MG tablet Take 1 tablet (100 mg total) by mouth daily. 08/06/22  Yes   triamcinolone ointment (KENALOG) 0.1 % Apply 1 gram to affected area topically once daily. 08/06/22  Yes   valACYclovir (VALTREX) 1000 MG tablet Take 1 tablet (1,000 mg total) by mouth daily. 01/01/23  Yes   Vitamin D, Ergocalciferol,  50000 units CAPS Take 1 capsule by mouth once a week. 04/20/23  Yes   estradiol (ESTRACE) 0.1 MG/GM vaginal cream Insert 1 g every day by vaginal route for 14 days, for vaginal irritation. Patient not taking: Reported on 06/28/2023 01/03/23     glucose blood (FREESTYLE LITE) test strip Use 1 strip 2 (two) times daily. 08/06/22         Allergies    Percocet [oxycodone-acetaminophen]    Review of Systems   Review of Systems  Unable to perform ROS: Acuity of condition    Physical Exam Updated Vital Signs BP (!) 159/103   Pulse 75   Temp 98.4 F (36.9 C)   Resp 17   Wt 103.4 kg   SpO2 99%   BMI 44.52 kg/m  Physical Exam Vitals and nursing note reviewed.  Constitutional:      General: She is not in acute distress. HENT:     Head: Normocephalic and atraumatic.  Eyes:     Conjunctiva/sclera: Conjunctivae normal.     Pupils: Pupils are equal, round, and reactive to light.  Cardiovascular:     Rate and Rhythm: Normal rate and regular rhythm.  Pulmonary:     Effort: Pulmonary effort is normal. No respiratory distress.  Abdominal:     General: There is no distension.     Tenderness: There is no guarding.  Musculoskeletal:        General: No deformity or signs of injury.     Cervical back: Neck supple.  Skin:    Findings: No lesion or rash.  Neurological:     Mental Status: She is alert.     Comments: Deferred to neurology, actively evaluating the patient bedside     ED Results / Procedures / Treatments   Labs (all labs ordered are listed, but only abnormal results are displayed) Labs Reviewed  CBC - Abnormal; Notable for the following components:      Result Value   RBC 5.24 (*)    MCV 73.1 (*)    MCH 23.1 (*)    RDW 16.0 (*)    All other components within normal limits  COMPREHENSIVE METABOLIC PANEL - Abnormal; Notable for the following components:   Glucose, Bld 105 (*)    All other components within normal limits  I-STAT CHEM 8, ED - Abnormal; Notable for the  following components:   Glucose, Bld 101 (*)    All other components within normal limits  CBG MONITORING, ED - Abnormal; Notable for the following components:   Glucose-Capillary 102 (*)    All other components within normal limits  ETHANOL  PROTIME-INR  APTT  DIFFERENTIAL  RAPID URINE DRUG SCREEN, HOSP PERFORMED  URINALYSIS, ROUTINE W REFLEX MICROSCOPIC    EKG EKG Interpretation Date/Time:  Monday June 28 2023 18:59:51 EDT Ventricular Rate:  66 PR Interval:  251 QRS Duration:  94 QT Interval:  412 QTC Calculation:  432 R Axis:   -1  Text Interpretation: Sinus rhythm Prolonged PR interval T wave inversions in the lateral leads Confirmed by Ernie Avena ([id number]) on 06/28/2023 10:36:04 PM  Radiology MR ANGIO HEAD WO CONTRAST  Result Date: 06/28/2023 CLINICAL DATA:  Stroke follow-up EXAM: MRA HEAD WITHOUT CONTRAST TECHNIQUE: Angiographic images of the Circle of Willis were acquired using MRA technique without intravenous contrast. COMPARISON:  None Available. FINDINGS: Anterior circulation: The internal carotid, anterior cerebral and middle cerebral arteries are proximally patent. Posterior circulation: The basilar artery and V4 segments of the vertebral arteries are normal. Both posterior communicating arteries are patent. The posterior cerebral arteries are patent proximally. Other: Markedly motion degraded study. IMPRESSION: 1. Markedly motion degraded study. 2. No proximal large vessel occlusion. Electronically Signed   By: Deatra Robinson M.D.   On: 06/28/2023 22:19   MR BRAIN WO CONTRAST  Result Date: 06/28/2023 CLINICAL DATA:  Acute neurologic deficit EXAM: MRI HEAD WITHOUT CONTRAST TECHNIQUE: Multiplanar, multiecho pulse sequences of the brain and surrounding structures were obtained without intravenous contrast. COMPARISON:  None Available. FINDINGS: Brain: No acute infarct, mass effect or extra-axial collection. No acute or chronic hemorrhage. Normal white matter signal,  parenchymal volume and CSF spaces. The midline structures are normal. Vascular: Major flow voids are preserved. Skull and upper cervical spine: Normal calvarium and skull base. Visualized upper cervical spine and soft tissues are normal. Sinuses/Orbits:No paranasal sinus fluid levels or advanced mucosal thickening. No mastoid or middle ear effusion. Normal orbits. IMPRESSION: Normal brain MRI. Electronically Signed   By: Deatra Robinson M.D.   On: 06/28/2023 22:01   CT HEAD CODE STROKE WO CONTRAST  Result Date: 06/28/2023 CLINICAL DATA:  Code stroke. Neuro deficit, acute, stroke suspected. Left-sided weakness. Left facial droop. EXAM: CT HEAD WITHOUT CONTRAST TECHNIQUE: Contiguous axial images were obtained from the base of the skull through the vertex without intravenous contrast. RADIATION DOSE REDUCTION: This exam was performed according to the departmental dose-optimization program which includes automated exposure control, adjustment of the mA and/or kV according to patient size and/or use of iterative reconstruction technique. COMPARISON:  CT head without contrast 12/18/2018. MR head without contrast 01/10/2020 FINDINGS: Brain: No acute infarct, hemorrhage, or mass lesion is present. Insert normal D matter No significant white matter lesions are present. Deep brain nuclei are within normal limits. The ventricles are of normal size. No significant extraaxial fluid collection is present. The brainstem and cerebellum are within normal limits. Midline structures are within normal limits. Vascular: No hyperdense vessel or unexpected calcification. Skull: Calvarium is intact. No focal lytic or blastic lesions are present. No significant extracranial soft tissue lesion is present. Sinuses/Orbits: The paranasal sinuses and mastoid air cells are clear. The globes and orbits are within normal limits. ASPECTS [facility] Stroke Program Early CT Score) - Ganglionic level infarction (caudate, lentiform nuclei, internal  capsule, insula, M1-M3 cortex): 7/7 - Supraganglionic infarction (M4-M6 cortex): 3/3 Total score (0-10 with 10 being normal): 10/10 IMPRESSION: 1. Negative CT of the head. 2. Aspects is 10/10. The above was relayed via text pager to Dr. Roda Shutters on 06/28/2023 at 18:12 . Electronically Signed   By: Marin Roberts M.D.   On: 06/28/2023 18:12    Procedures Procedures    Medications Ordered in ED Medications - No data to display  ED Course/ Medical Decision Making/ A&P  Medical Decision Making  52 year old female presenting to the emergency department as a code stroke.  She came to work at 3 PM today then experienced sudden onset facial droop with slurred speech and left-sided numbness.  Time of onset was around 4 PM.  She was sent to the emergency room as a code stroke.  Airway was cleared on patient arrival to the ER and the patient was taken to the CT scanner for code stroke imaging.  Neurology was bedside for evaluation.  On arrival, the patient was vitally stable, initial stroke workup initiated to include CT imaging which was reassuring.  CBG was 102 on arrival.  Remaining laboratory evaluation was generally unremarkable.  Per neurology evaluation, Dr. Roda Shutters, suspect conversion disorder.  If MRI imaging of the brain and MRA of the head is normal, patient can be discharged with outpatient follow-up.  MRI brain: Normal  MRA head: IMPRESSION:  1. Markedly motion degraded study.  2. No proximal large vessel occlusion.   Neurology comfortable with plan for discharge with concern for conversion disorder, plan for outpatient follow-up with her PCP.  Neurology also recommended outpatient psychiatry referral.  On my repeat assessment, the patient question whether she could have Bell's palsy.  I evaluated the patient.  She inconsistently displays a facial droop on exam.  It spares the forehead there is no forehead involvement whatsoever.  She is able to symmetrically  raise both eyebrows.  I have very low concern for Bell's palsy at this time.  Patient with strong suspicion for conversion disorder per neurology.  Stable for discharge at this time.   Final Clinical Impression(s) / ED Diagnoses Final diagnoses:  Stroke-like symptoms  Conversion disorder    Rx / DC Orders ED Discharge Orders     None         Ernie Avena, MD 06/28/23 2243

## 2023-06-28 NOTE — ED Notes (Signed)
Code stroke activated per RN Alana/Triage request.

## 2023-07-04 NOTE — Progress Notes (Unsigned)
Follow Up Note  RE: Shelley Mccoy MRN: [id number] DOB: [birth year] Date of Office Visit: 07/05/2023  Referring provider: Tylene Fantasia, * Primary care provider: Tylene Fantasia, MD  Chief Complaint: No chief complaint on file.  History of Present Illness: I had the pleasure of seeing Shelley Mccoy for a follow up visit at the [facility] on 07/04/2023. She is a 52 y.o. female, who is being followed for asthma, CIU on Xolair, allergic rhinoconjunctivitis. Her previous allergy office visit was on 05/03/2023 with Dr. Selena Batten. Today is a regular follow up visit.  Discussed the use of AI scribe software for clinical note transcription with the patient, who gave verbal consent to proceed.  History of Present Illness            Not well controlled moderate persistent asthma Dyspnea on exertion Past history - 2020 spirometry showed: possible restrictive disease and 21% improvement in FEV1 post bronchodilator treatment.  Interim history - main complaint of DOE. Using albuterol daily in the mornings and some confusion as to when to use Advair. Today's spirometry showed possible restrictive disease with 10% improvement in FEV1 post bronchodilator treatment. Clinically feeling improved.  Follow up with PCP regarding DOE - rule out cardiac issues given medical history with DM, HTN and HLP. Daily controller medication(s): Trelegy 200mg  1 puff once a day. Sample given and demonstrated proper use.  STOP PURPLE inhaler. During respiratory infections/flares:  Start budesonide nebulizer twice a day for 1-2 weeks until your breathing symptoms return to baseline.  Pretreat with albuterol 2 puffs or albuterol nebulizer.  If you need to use your albuterol nebulizer machine back to back within 15-30 minutes with no relief then please go to the ER/urgent care for further evaluation.  May use albuterol rescue inhaler 2 puffs or nebulizer every 4 to 6 hours as needed for  shortness of breath, chest tightness, coughing, and wheezing. May use albuterol rescue inhaler 2 puffs 5 to 15 minutes prior to strenuous physical activities. Monitor frequency of use - if you need to use it more than twice per week on a consistent basis let us know.    Chronic idiopathic urticaria Past history - Breaking out in rash/hives for the past 3 years. Urticaria panel unremarkable. Unable to wean to every 5 weeks.  Interim history - controlled.  Xolair injections 300mg  every 4 weeks.  Continue with zyrtec 10mg  daily - also for allergies.  Monitor symptoms.  Avoid the following potential triggers: alcohol, tight clothing, NSAIDs.    Seasonal and perennial allergic rhinoconjunctivitis Seasonal allergic rhinitis due to pollen Allergic rhinitis due to dust mite Allergic rhinitis due to mold Allergy to cockroaches Past history - 2020 skin testing showed: Positive to ragweed and dust mites, molds and cockroaches. Continue environmental control measures. May use over the counter antihistamines such as Zyrtec (cetirizine), Claritin (loratadine), Allegra (fexofenadine), or Xyzal (levocetirizine) daily as needed. Use Flonase (fluticasone) nasal spray 1 spray per nostril twice a day as needed for nasal congestion.   Assessment and Plan: Shelley Mccoy is a 52 y.o. female with: *** Assessment and Plan              No follow-ups on file.  No orders of the defined types were placed in this encounter.  Lab Orders  No laboratory test(s) ordered today    Diagnostics: Spirometry:  Tracings reviewed. Her effort: {Blank single:19197::"Good reproducible efforts.","It was hard to get consistent efforts and there is a question as to whether this reflects  a maximal maneuver.","Poor effort, data can not be interpreted."} FVC: ***L FEV1: ***L, ***% predicted FEV1/FVC ratio: ***% Interpretation: {Blank single:19197::"Spirometry consistent with mild obstructive disease","Spirometry consistent  with moderate obstructive disease","Spirometry consistent with severe obstructive disease","Spirometry consistent with possible restrictive disease","Spirometry consistent with mixed obstructive and restrictive disease","Spirometry uninterpretable due to technique","Spirometry consistent with normal pattern","No overt abnormalities noted given today's efforts"}.  Please see scanned spirometry results for details.  Skin Testing: {Blank single:19197::"Select foods","Environmental allergy panel","Environmental allergy panel and select foods","Food allergy panel","None","Deferred due to recent antihistamines use"}. *** Results discussed with patient/family.   Medication List:  Current Outpatient Medications  Medication Sig Dispense Refill   albuterol (PROVENTIL) (2.5 MG/3ML) 0.083% nebulizer solution Take 3 mLs (2.5 mg total) by nebulization every 4 (four) hours as needed for wheezing or shortness of breath (coughing fits). 75 mL 1   albuterol (VENTOLIN HFA) 108 (90 Base) MCG/ACT inhaler Inhale 2 puffs into the lungs every 4 (four) hours as needed for wheezing or shortness of breath (coughing fits). 6.7 g 1   Ascorbic Acid (VITAMIN C PO) Take 1 tablet by mouth daily.     budesonide (PULMICORT) 0.5 MG/2ML nebulizer solution Inhale 2 mLs (0.5 mg) by nebulization in the morning and at bedtime. Take for 1-2 weeks during asthma flare. 120 mL 2   cetirizine (ZYRTEC) 10 MG tablet Take 1 tablet (10 mg total) by mouth daily. 100 tablet 3   conjugated estrogens (PREMARIN) vaginal cream Place 1 Applicatorful vaginally daily. (Patient taking differently: Place 1 Application vaginally See admin instructions. Insert 1 Applicatorful into the vagina weekly for vaginal irritation) 30 g 1   Continuous Glucose Receiver (DEXCOM G6 RECEIVER) DEVI Use as directed 1 each 0   Continuous Glucose Sensor (DEXCOM G6 SENSOR) MISC Use as directed. (change every 10 days) 3 each 11   Continuous Glucose Transmitter (DEXCOM G6  TRANSMITTER) MISC Use as directed for monitoring blood sugar 1 each 3   Dulaglutide (TRULICITY) 0.75 MG/0.5ML SOPN Inject 0.75 mg into the skin once a week. (Patient taking differently: Inject 0.75 mg into the skin once a week. Sunday) 2 mL 3   EPINEPHrine 0.3 mg/0.3 mL IJ SOAJ injection Use as directed as needed 2 each 4   estradiol (ESTRACE) 0.1 MG/GM vaginal cream Insert 1 g every day by vaginal route for 14 days, for vaginal irritation. (Patient not taking: Reported on 06/28/2023) 42.5 g 1   famotidine (PEPCID) 20 MG tablet Take 1 tablet (20 mg total) by mouth 2 (two) times daily. 180 tablet 3   Ferrous Sulfate (IRON PO) Take 1 tablet by mouth daily.     fluticasone (FLONASE) 50 MCG/ACT nasal spray Place 2 sprays into both nostrils daily. 16 g 3   Fluticasone-Umeclidin-Vilant (TRELEGY ELLIPTA) 200-62.5-25 MCG/ACT AEPB Inhale 1 puff into the lungs daily. Rinse mouth after each use. 60 each 3   gabapentin (NEURONTIN) 300 MG capsule Take 1 capsule (300 mg total) by mouth 3 (three) times daily. 270 capsule 3   glucose blood (FREESTYLE LITE) test strip Use 1 strip 2 (two) times daily. 150 strip 3   hydrochlorothiazide (HYDRODIURIL) 12.5 MG tablet Take 1 tablet (12.5 mg total) by mouth daily. 90 tablet 3   losartan (COZAAR) 50 MG tablet Take 1 tablet (50 mg total) by mouth daily. 90 tablet 3   omalizumab (XOLAIR) 150 MG/ML prefilled syringe Inject 300 mg into the skin every 28 (twenty-eight) days. 2 mL 11   Omega-3 Fatty Acids (FISH OIL) 1000 MG CAPS Take 1,000 mg by  mouth daily.     rosuvastatin (CRESTOR) 20 MG tablet Take 1 tablet (20 mg total) by mouth daily. 90 tablet 3   sertraline (ZOLOFT) 100 MG tablet Take 1 tablet (100 mg total) by mouth daily. 90 tablet 3   triamcinolone ointment (KENALOG) 0.1 % Apply 1 gram to affected area topically once daily. 30 g 4   valACYclovir (VALTREX) 1000 MG tablet Take 1 tablet (1,000 mg total) by mouth daily. 90 tablet 3   Vitamin D, Ergocalciferol, 50000 units  CAPS Take 1 capsule by mouth once a week. 13 capsule 3   Current Facility-Administered Medications  Medication Dose Route Frequency Provider Last Rate Last Admin   omalizumab Geoffry Paradise) injection 300 mg  300 mg Subcutaneous Q28 days Alfonse Spruce, MD   300 mg at 06/21/23 1354   Allergies: Allergies  Allergen Reactions   Percocet [Oxycodone-Acetaminophen] Hives   I reviewed her past medical history, social history, family history, and environmental history and no significant changes have been reported from her previous visit.  Review of Systems  Constitutional:  Negative for appetite change, chills, fever and unexpected weight change.  HENT:  Negative for congestion.   Eyes:  Negative for itching.  Respiratory:  Positive for cough and shortness of breath. Negative for chest tightness and wheezing.   Cardiovascular:  Negative for chest pain.  Gastrointestinal:  Negative for abdominal pain.  Genitourinary:  Negative for difficulty urinating.  Skin:  Negative for rash.  Allergic/Immunologic: Positive for environmental allergies. Negative for food allergies.    Objective: There were no vitals taken for this visit. There is no height or weight on file to calculate BMI. Physical Exam Vitals and nursing note reviewed.  Constitutional:      Appearance: Normal appearance. She is well-developed. She is obese.  HENT:     Head: Normocephalic and atraumatic.     Right Ear: Tympanic membrane and external ear normal.     Left Ear: Tympanic membrane and external ear normal.     Nose: Nose normal.     Mouth/Throat:     Mouth: Mucous membranes are moist.     Pharynx: Oropharynx is clear.  Eyes:     Conjunctiva/sclera: Conjunctivae normal.  Cardiovascular:     Rate and Rhythm: Normal rate and regular rhythm.     Heart sounds: Normal heart sounds. No murmur heard.    No friction rub. No gallop.  Pulmonary:     Effort: Pulmonary effort is normal.     Breath sounds: Normal breath  sounds. No wheezing, rhonchi or rales.  Musculoskeletal:     Cervical back: Neck supple.  Skin:    General: Skin is warm.     Findings: No rash.  Neurological:     Mental Status: She is alert and oriented to person, place, and time.  Psychiatric:        Behavior: Behavior normal.    Previous notes and tests were reviewed. The plan was reviewed with the patient/family, and all questions/concerned were addressed.  It was my pleasure to see Shelley Mccoy today and participate in her care. Please feel free to contact me with any questions or concerns.  Sincerely,  Wyline Mood, DO Allergy & Immunology  [facility] office: [phone] [facility] office: [phone]

## 2023-07-05 ENCOUNTER — Ambulatory Visit (INDEPENDENT_AMBULATORY_CARE_PROVIDER_SITE_OTHER): Payer: 59 | Admitting: Allergy

## 2023-07-05 ENCOUNTER — Encounter: Payer: Self-pay | Admitting: Allergy

## 2023-07-05 ENCOUNTER — Encounter: Payer: Self-pay | Admitting: Neurology

## 2023-07-05 VITALS — BP 122/70 | HR 82 | Temp 97.3°F

## 2023-07-05 DIAGNOSIS — L501 Idiopathic urticaria: Secondary | ICD-10-CM | POA: Diagnosis not present

## 2023-07-05 DIAGNOSIS — J301 Allergic rhinitis due to pollen: Secondary | ICD-10-CM | POA: Diagnosis not present

## 2023-07-05 DIAGNOSIS — J4540 Moderate persistent asthma, uncomplicated: Secondary | ICD-10-CM | POA: Diagnosis not present

## 2023-07-05 DIAGNOSIS — R030 Elevated blood-pressure reading, without diagnosis of hypertension: Secondary | ICD-10-CM

## 2023-07-05 DIAGNOSIS — H1013 Acute atopic conjunctivitis, bilateral: Secondary | ICD-10-CM

## 2023-07-05 DIAGNOSIS — Z91038 Other insect allergy status: Secondary | ICD-10-CM

## 2023-07-05 DIAGNOSIS — R4781 Slurred speech: Secondary | ICD-10-CM

## 2023-07-05 DIAGNOSIS — J3089 Other allergic rhinitis: Secondary | ICD-10-CM

## 2023-07-05 NOTE — Patient Instructions (Addendum)
Asthma  Daily controller medication(s): continue Trelegy 200mg  1 puff once a day. During respiratory infections/flares:  Start budesonide nebulizer twice a day for 1-2 weeks until your breathing symptoms return to baseline.  Pretreat with albuterol 2 puffs or albuterol nebulizer.  If you need to use your albuterol nebulizer machine back to back within 15-30 minutes with no relief then please go to the ER/urgent care for further evaluation.  May use albuterol rescue inhaler 2 puffs or nebulizer every 4 to 6 hours as needed for shortness of breath, chest tightness, coughing, and wheezing. May use albuterol rescue inhaler 2 puffs 5 to 15 minutes prior to strenuous physical activities. Monitor frequency of use - if you need to use it more than twice per week on a consistent basis let us know.  Breathing control goals:  Full participation in all desired activities (may need albuterol before activity) Albuterol use two times or less a week on average (not counting use with activity) Cough interfering with sleep two times or less a month Oral steroids no more than once a year No hospitalizations  Chronic urticaria Xolair injections 300mg  every 4 weeks.  Continue with zyrtec 10mg  daily - also for allergies.  Monitor symptoms.  Avoid the following potential triggers: alcohol, tight clothing, NSAIDs.    Seasonal and perennial allergic rhinoconjunctivitis 2020 skin testing showed: Positive to ragweed and dust mites, molds and cockroaches. Continue environmental control measures. May use over the counter antihistamines such as Zyrtec (cetirizine), Claritin (loratadine), Allegra (fexofenadine), or Xyzal (levocetirizine) daily as needed. Use Flonase (fluticasone) nasal spray 1 spray per nostril twice a day as needed for nasal congestion.   Elevated blood pressure  Blood pressure reading was high in our office today. Vitals:   07/05/23 1023 07/05/23 1110  BP: (!) 138/96 122/70  Please follow up with  PCP regarding this and slurred speech.   Make sure to keep neurology appointment.   Follow up in 2 months or sooner if needed.

## 2023-07-08 DIAGNOSIS — Z23 Encounter for immunization: Secondary | ICD-10-CM | POA: Diagnosis not present

## 2023-07-09 ENCOUNTER — Other Ambulatory Visit: Payer: Self-pay

## 2023-07-09 NOTE — Progress Notes (Signed)
Specialty Pharmacy Refill Coordination Note  Shelley Mccoy is a 52 y.o. female contacted today regarding refills of specialty medication(s) Omalizumab   Patient requested Courier to Provider Office   Delivery date: 07/15/23   Verified address: [facility], [address] [place], [place], [address]   Medication will be filled on 07/14/23.

## 2023-07-19 ENCOUNTER — Ambulatory Visit: Payer: 59

## 2023-07-19 ENCOUNTER — Ambulatory Visit: Payer: 59 | Admitting: *Deleted

## 2023-07-19 DIAGNOSIS — L501 Idiopathic urticaria: Secondary | ICD-10-CM

## 2023-07-26 ENCOUNTER — Other Ambulatory Visit: Payer: Self-pay

## 2023-07-26 ENCOUNTER — Other Ambulatory Visit (HOSPITAL_COMMUNITY): Payer: Self-pay

## 2023-07-27 ENCOUNTER — Other Ambulatory Visit (HOSPITAL_COMMUNITY): Payer: Self-pay

## 2023-07-27 ENCOUNTER — Other Ambulatory Visit: Payer: Self-pay

## 2023-07-27 MED ORDER — TRIAMCINOLONE ACETONIDE 0.1 % EX OINT
1.0000 | TOPICAL_OINTMENT | Freq: Every day | CUTANEOUS | 4 refills | Status: DC
  Filled 2023-07-27: qty 30, 30d supply, fill #0
  Filled 2023-09-23: qty 30, 30d supply, fill #1

## 2023-07-28 ENCOUNTER — Other Ambulatory Visit (HOSPITAL_COMMUNITY): Payer: Self-pay

## 2023-08-04 ENCOUNTER — Other Ambulatory Visit: Payer: Self-pay

## 2023-08-04 ENCOUNTER — Encounter (HOSPITAL_COMMUNITY): Payer: Self-pay

## 2023-08-04 NOTE — Progress Notes (Signed)
Specialty Pharmacy Refill Coordination Note  Shelley Mccoy is a 52 y.o. female contacted today regarding refills of specialty medication(s) Omalizumab   Patient requested Courier to Provider Office   Delivery date: 08/12/23   Verified address: [facility], [address] [place], [place], [address]   Medication will be filled on 08/11/23.

## 2023-08-04 NOTE — Progress Notes (Signed)
Specialty Pharmacy Ongoing Clinical Assessment Note  Shelley Mccoy is a 52 y.o. female who is being followed by the specialty pharmacy service for RxSp Allergy   Patient's specialty medication(s) reviewed today: Omalizumab   Missed doses in the last 4 weeks: 0   Patient/Caregiver did not have any additional questions or concerns.   Therapeutic benefit summary: Patient is achieving benefit   Adverse events/side effects summary: No adverse events/side effects   Patient's therapy is appropriate to: Continue    Goals Addressed             This Visit's Progress    Reduce signs and symptoms       Patient is on track. Patient will maintain adherence         Follow up:  6 months  Otto Herb Specialty Pharmacist

## 2023-08-06 ENCOUNTER — Ambulatory Visit: Payer: 59 | Admitting: Neurology

## 2023-08-16 ENCOUNTER — Ambulatory Visit: Payer: 59

## 2023-08-16 DIAGNOSIS — L501 Idiopathic urticaria: Secondary | ICD-10-CM | POA: Diagnosis not present

## 2023-08-23 ENCOUNTER — Other Ambulatory Visit (HOSPITAL_COMMUNITY): Payer: Self-pay

## 2023-08-23 MED ORDER — GABAPENTIN 300 MG PO CAPS
300.0000 mg | ORAL_CAPSULE | Freq: Three times a day (TID) | ORAL | 3 refills | Status: DC
  Filled 2023-08-23 – 2023-10-28 (×2): qty 270, 90d supply, fill #0
  Filled 2024-02-11: qty 270, 90d supply, fill #1

## 2023-08-23 MED ORDER — VITAMIN D (ERGOCALCIFEROL) 50000 UNITS PO CAPS
1.0000 | ORAL_CAPSULE | ORAL | 3 refills | Status: AC
  Filled 2023-08-23: qty 13, 91d supply, fill #0
  Filled 2023-10-28: qty 13, 90d supply, fill #0
  Filled 2024-02-11: qty 13, 90d supply, fill #1
  Filled 2024-05-10: qty 13, 90d supply, fill #2

## 2023-08-23 MED ORDER — VALACYCLOVIR HCL 1 G PO TABS
1000.0000 mg | ORAL_TABLET | Freq: Every day | ORAL | 3 refills | Status: DC
  Filled 2023-08-23 – 2023-10-28 (×2): qty 90, 90d supply, fill #0
  Filled 2024-02-11: qty 90, 90d supply, fill #1

## 2023-08-23 MED ORDER — ESTRADIOL 0.1 MG/GM VA CREA
1.0000 g | TOPICAL_CREAM | Freq: Every day | VAGINAL | 1 refills | Status: AC
  Filled 2023-08-23: qty 42.5, 14d supply, fill #0
  Filled 2023-09-23 – 2023-10-28 (×2): qty 42.5, 42d supply, fill #0
  Filled 2023-12-27: qty 42.5, 90d supply, fill #1

## 2023-08-23 MED ORDER — HYDROCHLOROTHIAZIDE 12.5 MG PO TABS
12.5000 mg | ORAL_TABLET | Freq: Every day | ORAL | 3 refills | Status: DC
  Filled 2023-08-23: qty 90, 90d supply, fill #0
  Filled 2023-11-30: qty 90, 90d supply, fill #1

## 2023-08-23 MED ORDER — TRIAMCINOLONE ACETONIDE 0.1 % EX OINT
1.0000 | TOPICAL_OINTMENT | Freq: Every day | CUTANEOUS | 4 refills | Status: AC
  Filled 2023-08-23: qty 30, 30d supply, fill #0
  Filled 2023-10-28: qty 30, 30d supply, fill #1
  Filled 2023-11-30: qty 30, 30d supply, fill #2
  Filled 2023-12-27: qty 30, 30d supply, fill #3
  Filled 2024-02-11 – 2024-04-19 (×2): qty 30, 30d supply, fill #4

## 2023-08-23 MED ORDER — ALBUTEROL SULFATE HFA 108 (90 BASE) MCG/ACT IN AERS
2.0000 | INHALATION_SPRAY | RESPIRATORY_TRACT | 3 refills | Status: DC
  Filled 2023-08-23: qty 13.4, 34d supply, fill #0
  Filled 2023-09-23 – 2023-10-28 (×2): qty 13.4, 34d supply, fill #1
  Filled 2023-11-30: qty 13.4, 34d supply, fill #2
  Filled 2024-02-11: qty 13.4, 34d supply, fill #3

## 2023-08-23 MED ORDER — FAMOTIDINE 20 MG PO TABS
20.0000 mg | ORAL_TABLET | Freq: Two times a day (BID) | ORAL | 3 refills | Status: AC
  Filled 2023-08-23: qty 180, 90d supply, fill #0
  Filled 2023-11-30: qty 180, 90d supply, fill #1
  Filled 2024-04-19: qty 180, 90d supply, fill #2

## 2023-08-23 MED ORDER — CETIRIZINE HCL 10 MG PO TABS
10.0000 mg | ORAL_TABLET | Freq: Every day | ORAL | 3 refills | Status: DC
Start: 2023-08-23 — End: 2024-02-16
  Filled 2023-08-23: qty 100, 100d supply, fill #0
  Filled 2024-02-11: qty 100, 100d supply, fill #1

## 2023-08-23 MED ORDER — FLUTICASONE-SALMETEROL 250-50 MCG/ACT IN AEPB
1.0000 | INHALATION_SPRAY | Freq: Two times a day (BID) | RESPIRATORY_TRACT | 0 refills | Status: DC
  Filled 2023-08-23: qty 60, 30d supply, fill #0

## 2023-08-25 ENCOUNTER — Other Ambulatory Visit (HOSPITAL_COMMUNITY): Payer: Self-pay

## 2023-09-01 ENCOUNTER — Ambulatory Visit: Payer: 59 | Admitting: Allergy

## 2023-09-01 DIAGNOSIS — J309 Allergic rhinitis, unspecified: Secondary | ICD-10-CM

## 2023-09-02 ENCOUNTER — Other Ambulatory Visit: Payer: Self-pay

## 2023-09-02 NOTE — Progress Notes (Signed)
Specialty Pharmacy Refill Coordination Note  Shelley Mccoy is a 52 y.o. female contacted today regarding refills of specialty medication(s) Omalizumab   Patient requested Courier to Provider Office   Delivery date: 09/09/23   Verified address: [facility], [address] [place], [place], [address]   Medication will be filled on 09/08/23.

## 2023-09-04 DIAGNOSIS — J028 Acute pharyngitis due to other specified organisms: Secondary | ICD-10-CM | POA: Diagnosis not present

## 2023-09-04 DIAGNOSIS — R070 Pain in throat: Secondary | ICD-10-CM | POA: Diagnosis not present

## 2023-09-04 DIAGNOSIS — H65193 Other acute nonsuppurative otitis media, bilateral: Secondary | ICD-10-CM | POA: Diagnosis not present

## 2023-09-04 DIAGNOSIS — H9201 Otalgia, right ear: Secondary | ICD-10-CM | POA: Diagnosis not present

## 2023-09-04 DIAGNOSIS — R0982 Postnasal drip: Secondary | ICD-10-CM | POA: Diagnosis not present

## 2023-09-06 DIAGNOSIS — I10 Essential (primary) hypertension: Secondary | ICD-10-CM | POA: Diagnosis not present

## 2023-09-06 DIAGNOSIS — G459 Transient cerebral ischemic attack, unspecified: Secondary | ICD-10-CM | POA: Diagnosis not present

## 2023-09-06 DIAGNOSIS — E119 Type 2 diabetes mellitus without complications: Secondary | ICD-10-CM | POA: Diagnosis not present

## 2023-09-06 DIAGNOSIS — E785 Hyperlipidemia, unspecified: Secondary | ICD-10-CM | POA: Diagnosis not present

## 2023-09-08 ENCOUNTER — Other Ambulatory Visit: Payer: Self-pay

## 2023-09-13 ENCOUNTER — Ambulatory Visit (INDEPENDENT_AMBULATORY_CARE_PROVIDER_SITE_OTHER): Payer: 59 | Admitting: *Deleted

## 2023-09-13 DIAGNOSIS — R4182 Altered mental status, unspecified: Secondary | ICD-10-CM | POA: Diagnosis not present

## 2023-09-13 DIAGNOSIS — L501 Idiopathic urticaria: Secondary | ICD-10-CM | POA: Diagnosis not present

## 2023-09-23 ENCOUNTER — Other Ambulatory Visit: Payer: Self-pay

## 2023-09-23 ENCOUNTER — Other Ambulatory Visit (HOSPITAL_COMMUNITY): Payer: Self-pay

## 2023-09-24 ENCOUNTER — Other Ambulatory Visit (HOSPITAL_COMMUNITY): Payer: Self-pay

## 2023-09-24 ENCOUNTER — Encounter (HOSPITAL_COMMUNITY): Payer: Self-pay

## 2023-09-24 MED ORDER — LOSARTAN POTASSIUM 50 MG PO TABS
50.0000 mg | ORAL_TABLET | Freq: Every day | ORAL | 3 refills | Status: DC
  Filled 2023-09-24 – 2023-10-28 (×2): qty 90, 90d supply, fill #0
  Filled 2024-02-11: qty 90, 90d supply, fill #1

## 2023-09-24 MED ORDER — SERTRALINE HCL 100 MG PO TABS
100.0000 mg | ORAL_TABLET | Freq: Every day | ORAL | 3 refills | Status: DC
  Filled 2023-09-24 – 2023-10-28 (×2): qty 90, 90d supply, fill #0
  Filled 2024-02-11: qty 90, 90d supply, fill #1

## 2023-09-24 MED ORDER — ROSUVASTATIN CALCIUM 20 MG PO TABS
20.0000 mg | ORAL_TABLET | Freq: Every day | ORAL | 3 refills | Status: DC
  Filled 2023-09-24 – 2023-10-28 (×3): qty 90, 90d supply, fill #0
  Filled 2024-02-11: qty 90, 90d supply, fill #1

## 2023-09-27 ENCOUNTER — Ambulatory Visit: Payer: 59 | Admitting: Allergy

## 2023-10-01 ENCOUNTER — Other Ambulatory Visit (HOSPITAL_COMMUNITY): Payer: Self-pay | Admitting: Pharmacy Technician

## 2023-10-01 ENCOUNTER — Other Ambulatory Visit (HOSPITAL_COMMUNITY): Payer: Self-pay

## 2023-10-01 NOTE — Progress Notes (Signed)
 Specialty Pharmacy Refill Coordination Note  Shelley Mccoy is a 53 y.o. female contacted today regarding refills of specialty medication(s) Omalizumab  (XOLAIR )   Patient requested Courier to Provider Office   Delivery date: 10/07/23   Verified address: [facility] [address]   Medication will be filled on 10/06/23.

## 2023-10-06 ENCOUNTER — Other Ambulatory Visit (HOSPITAL_COMMUNITY): Payer: Self-pay

## 2023-10-06 ENCOUNTER — Other Ambulatory Visit: Payer: Self-pay

## 2023-10-08 ENCOUNTER — Other Ambulatory Visit (HOSPITAL_COMMUNITY): Payer: Self-pay

## 2023-10-10 NOTE — Progress Notes (Signed)
 Follow Up Note  RE: Shelley Mccoy MRN: [id number] DOB: 1971/01/23 Date of Office Visit: 10/11/2023  Referring provider: Arleta Mom, * Primary care provider: Arleta Mom, MD  Chief Complaint: Follow-up and Asthma  History of Present Illness: I had the pleasure of seeing Shelley Mccoy for a follow up visit at the [facility] on 10/11/2023. She is a 53 y.o. female, who is being followed for CIU on Xolair , asthma, allergic rhinoconjunctivitis. Her previous allergy  office visit was on 07/05/2023 with Dr. Luke. Today is a regular follow up visit.  Discussed the use of AI scribe software for clinical note transcription with the patient, who gave verbal consent to proceed.  She has been taking Trelegy once daily, mostly before walking to work. Initially patient said that she has only needed to use her nebulizer and rescue inhaler once each, and has not required any emergency care or prednisone .   However after asking more patients she states that she noticed that her symptoms seem to worsen at night, particularly in her current living situation where the house is often cold. She has had to use her inhaler and breathing treatment on three occasions in the past week due to coughing and difficulty breathing. She is considering moving due to these issues.  The patient is also receiving treatment for hypertension, but her blood pressure was reported as high at 140/90 during the visit, despite taking her medication. She has an upcoming appointment with her primary care doctor. She also reported having another episode of speech difficulty and has an upcoming appointment with neurology.  The patient is also on Zyrtec  for hives, and reports no itching since starting the Xolair  injections. She occasionally uses a nasal spray, particularly during the fall season. She has not experienced any nosebleeds.    She has not had any changes in her medications since the last  visit.   Assessment and Plan: Shelley Mccoy is a 53 y.o. female with: Chronic idiopathic urticaria Past history - Breaking out in rash/hives for the past 3 years. Urticaria panel unremarkable. Unable to wean to every 5 weeks.  Interim history - controlled with no flares.  Xolair  injections 300mg  every 4 weeks - given today.  Continue with zyrtec  10mg  daily - also for allergies.  Monitor symptoms.  Avoid the following potential triggers: alcohol , tight clothing, NSAIDs.    Moderate persistent asthma without complication Past history - 2020 spirometry showed: possible restrictive disease and 21% improvement in FEV1 post bronchodilator treatment.  Interim history - Improved control with Trelegy. Occasional nocturnal symptoms possibly related to cold environment. No recent ER visits or courses of prednisone . Today's spirometry showed some restriction - worse than prior.  Daily controller medication(s): continue Trelegy 200mg  1 puff once a day and rinse mouth after each use.  During respiratory infections/flares:  Start budesonide  nebulizer twice a day for 1-2 weeks until your breathing symptoms return to baseline.  Pretreat with albuterol  2 puffs or albuterol  nebulizer.  If you need to use your albuterol  nebulizer machine back to back within 15-30 minutes with no relief then please go to the ER/urgent care for further evaluation.  May use albuterol  rescue inhaler 2 puffs or nebulizer every 4 to 6 hours as needed for shortness of breath, chest tightness, coughing, and wheezing. May use albuterol  rescue inhaler 2 puffs 5 to 15 minutes prior to strenuous physical activities. Monitor frequency of use - if you need to use it more than twice per week on a consistent  basis let us  know.    Seasonal allergic rhinitis due to pollen Allergic rhinitis due to dust mite Allergy  to cockroaches Allergic conjunctivitis of both eyes Past history - 2020 skin testing positive to ragweed, dust mites, molds and  cockroaches. Fresh cut grass is a trigger.  Interim history - fresh cut grass is a trigger.  Continue environmental control measures. May use over the counter antihistamines such as Zyrtec  (cetirizine ), Claritin  (loratadine ), Allegra (fexofenadine), or Xyzal (levocetirizine) daily as needed. Use Flonase  (fluticasone ) nasal spray 1-2 sprays per nostril once a day as needed for nasal congestion.  Nasal saline spray (i.e., Simply Saline) or nasal saline lavage (i.e., NeilMed) is recommended as needed and prior to medicated nasal sprays.   Elevated blood pressure reading Follow up with PCP and neurology.   Return in about 3 months (around 01/09/2024).  Meds ordered this encounter  Medications   Fluticasone -Umeclidin-Vilant (TRELEGY ELLIPTA ) 200-62.5-25 MCG/ACT AEPB    Sig: Inhale 1 puff into the lungs daily. Rinse mouth after each use.    Dispense:  60 each    Refill:  5   budesonide  (PULMICORT ) 0.5 MG/2ML nebulizer solution    Sig: Inhale 2 mLs (0.5 mg) by nebulization in the morning and at bedtime. Take for 1-2 weeks during asthma flare.    Dispense:  120 mL    Refill:  2   Lab Orders  No laboratory test(s) ordered today    Diagnostics: Spirometry:  Tracings reviewed. Her effort: Good reproducible efforts. FVC: 1.41L FEV1: 1.06L, 53% predicted FEV1/FVC ratio: 75% Interpretation: Spirometry consistent with possible restrictive disease.  Please see scanned spirometry results for details.  Results discussed with patient/family.  Medication List:  Current Outpatient Medications  Medication Sig Dispense Refill   albuterol  (PROVENTIL ) (2.5 MG/3ML) 0.083% nebulizer solution Take 3 mLs (2.5 mg total) by nebulization every 4 (four) hours as needed for wheezing or shortness of breath (coughing fits). 75 mL 1   albuterol  (VENTOLIN  HFA) 108 (90 Base) MCG/ACT inhaler Inhale 2 puffs into the lungs every 4 (four) hours. 13.4 g 3   Ascorbic Acid (VITAMIN C PO) Take 1 tablet by mouth daily.      cetirizine  (ZYRTEC ) 10 MG tablet Take 1 tablet (10 mg total) by mouth daily. 90 tablet 3   conjugated estrogens  (PREMARIN ) vaginal cream Place 1 Applicatorful vaginally daily. (Patient taking differently: Place 1 Application vaginally See admin instructions. Insert 1 Applicatorful into the vagina weekly for vaginal irritation) 30 g 1   Continuous Glucose Receiver (DEXCOM G6 RECEIVER) DEVI Use as directed 1 each 0   Continuous Glucose Sensor (DEXCOM G6 SENSOR) MISC Use as directed. (change every 10 days) 3 each 11   Continuous Glucose Transmitter (DEXCOM G6 TRANSMITTER) MISC Use as directed for monitoring blood sugar 1 each 3   Dulaglutide  (TRULICITY ) 0.75 MG/0.5ML SOPN Inject 0.75 mg into the skin once a week. (Patient taking differently: Inject 0.75 mg into the skin once a week. Sunday) 2 mL 3   EPINEPHrine  0.3 mg/0.3 mL IJ SOAJ injection Use as directed as needed 2 each 4   estradiol  (ESTRACE ) 0.1 MG/GM vaginal cream Insert 1 gram vaginally daily for 14 days, for vaginal irritation 42.5 g 1   famotidine  (PEPCID ) 20 MG tablet Take 1 tablet (20 mg total) by mouth 2 (two) times daily. 180 tablet 3   Ferrous Sulfate (IRON PO) Take 1 tablet by mouth daily.     fluticasone  (FLONASE ) 50 MCG/ACT nasal spray Place 2 sprays into both nostrils daily. 16  g 3   gabapentin  (NEURONTIN ) 300 MG capsule Take 1 capsule (300 mg total) by mouth 3 (three) times daily. 270 capsule 3   glucose blood (FREESTYLE LITE) test strip Use 1 strip 2 (two) times daily. 150 strip 3   hydrochlorothiazide  (HYDRODIURIL ) 12.5 MG tablet Take 1 tablet (12.5 mg total) by mouth daily. 90 tablet 3   losartan  (COZAAR ) 50 MG tablet Take 1 tablet (50 mg total) by mouth daily. 90 tablet 3   omalizumab  (XOLAIR ) 150 MG/ML prefilled syringe Inject 300 mg into the skin every 28 (twenty-eight) days. 2 mL 11   Omega-3 Fatty Acids (FISH OIL) 1000 MG CAPS Take 1,000 mg by mouth daily.     rosuvastatin  (CRESTOR ) 20 MG tablet Take 1 tablet (20 mg  total) by mouth daily. 90 tablet 3   sertraline  (ZOLOFT ) 100 MG tablet Take 1 tablet (100 mg total) by mouth daily. 90 tablet 3   triamcinolone  ointment (KENALOG ) 0.1 % Apply 1 Application topically daily to affected area. 30 g 4   valACYclovir  (VALTREX ) 1000 MG tablet Take 1 tablet (1,000 mg total) by mouth daily. 90 tablet 3   Vitamin D , Ergocalciferol , 50000 units CAPS Take 50,000 Units by mouth once a week. 13 capsule 3   budesonide  (PULMICORT ) 0.5 MG/2ML nebulizer solution Inhale 2 mLs (0.5 mg) by nebulization in the morning and at bedtime. Take for 1-2 weeks during asthma flare. 120 mL 2   Fluticasone -Umeclidin-Vilant (TRELEGY ELLIPTA ) 200-62.5-25 MCG/ACT AEPB Inhale 1 puff into the lungs daily. Rinse mouth after each use. 60 each 5   Current Facility-Administered Medications  Medication Dose Route Frequency Provider Last Rate Last Admin   omalizumab  (XOLAIR ) injection 300 mg  300 mg Subcutaneous Q28 days Iva Marty Saltness, MD   300 mg at 10/11/23 9176   Allergies: Allergies  Allergen Reactions   Percocet [Oxycodone -Acetaminophen ] Hives   I reviewed her past medical history, social history, family history, and environmental history and no significant changes have been reported from her previous visit.  Review of Systems  Constitutional:  Negative for appetite change, chills, fever and unexpected weight change.  HENT:  Negative for congestion.   Eyes:  Negative for itching.  Respiratory:  Positive for shortness of breath. Negative for cough, chest tightness and wheezing.   Cardiovascular:  Negative for chest pain.  Gastrointestinal:  Negative for abdominal pain.  Genitourinary:  Negative for difficulty urinating.  Skin:  Negative for rash.  Allergic/Immunologic: Positive for environmental allergies. Negative for food allergies.    Objective: BP (!) 140/90   Pulse 74   Temp 98 F (36.7 C)   Resp 12   Ht 5' (1.524 m)   Wt 236 lb 3.2 oz (107.1 kg)   SpO2 97%   BMI 46.13  kg/m  Body mass index is 46.13 kg/m. Physical Exam Vitals and nursing note reviewed.  Constitutional:      Appearance: Normal appearance. She is well-developed. She is obese.  HENT:     Head: Normocephalic and atraumatic.     Right Ear: Tympanic membrane and external ear normal.     Left Ear: Tympanic membrane and external ear normal.     Nose: Nose normal.     Mouth/Throat:     Mouth: Mucous membranes are moist.     Pharynx: Oropharynx is clear.  Eyes:     Conjunctiva/sclera: Conjunctivae normal.  Cardiovascular:     Rate and Rhythm: Normal rate and regular rhythm.     Heart sounds: Normal heart sounds.  No murmur heard.    No friction rub. No gallop.  Pulmonary:     Effort: Pulmonary effort is normal.     Breath sounds: Normal breath sounds. No wheezing, rhonchi or rales.  Musculoskeletal:     Cervical back: Neck supple.  Skin:    General: Skin is warm.     Findings: No rash.  Neurological:     Mental Status: She is alert and oriented to person, place, and time.  Psychiatric:        Behavior: Behavior normal.    Previous notes and tests were reviewed. The plan was reviewed with the patient/family, and all questions/concerned were addressed.  It was my pleasure to see Shelley Mccoy today and participate in her care. Please feel free to contact me with any questions or concerns.  Sincerely,  Orlan Cramp, DO Allergy  & Immunology  [facility]  [place] office: [phone] [facility] office: [phone]

## 2023-10-11 ENCOUNTER — Encounter: Payer: Self-pay | Admitting: Allergy

## 2023-10-11 ENCOUNTER — Other Ambulatory Visit (HOSPITAL_COMMUNITY): Payer: Self-pay

## 2023-10-11 ENCOUNTER — Ambulatory Visit: Payer: 59

## 2023-10-11 ENCOUNTER — Other Ambulatory Visit: Payer: Self-pay

## 2023-10-11 ENCOUNTER — Ambulatory Visit: Payer: 59 | Admitting: Allergy

## 2023-10-11 VITALS — BP 140/90 | HR 74 | Temp 98.0°F | Resp 12 | Ht 60.0 in | Wt 236.2 lb

## 2023-10-11 DIAGNOSIS — Z91038 Other insect allergy status: Secondary | ICD-10-CM

## 2023-10-11 DIAGNOSIS — H1013 Acute atopic conjunctivitis, bilateral: Secondary | ICD-10-CM

## 2023-10-11 DIAGNOSIS — L501 Idiopathic urticaria: Secondary | ICD-10-CM

## 2023-10-11 DIAGNOSIS — J4540 Moderate persistent asthma, uncomplicated: Secondary | ICD-10-CM | POA: Diagnosis not present

## 2023-10-11 DIAGNOSIS — J301 Allergic rhinitis due to pollen: Secondary | ICD-10-CM

## 2023-10-11 DIAGNOSIS — J3089 Other allergic rhinitis: Secondary | ICD-10-CM | POA: Diagnosis not present

## 2023-10-11 DIAGNOSIS — R030 Elevated blood-pressure reading, without diagnosis of hypertension: Secondary | ICD-10-CM

## 2023-10-11 MED ORDER — BUDESONIDE 0.5 MG/2ML IN SUSP
0.5000 mg | Freq: Two times a day (BID) | RESPIRATORY_TRACT | 2 refills | Status: DC
  Filled 2023-10-11 – 2023-10-28 (×2): qty 120, 30d supply, fill #0
  Filled 2023-11-30: qty 120, 30d supply, fill #1
  Filled 2023-12-27: qty 120, 30d supply, fill #2

## 2023-10-11 MED ORDER — TRELEGY ELLIPTA 200-62.5-25 MCG/ACT IN AEPB
1.0000 | INHALATION_SPRAY | Freq: Every day | RESPIRATORY_TRACT | 5 refills | Status: DC
  Filled 2023-10-11 – 2023-10-29 (×3): qty 60, 30d supply, fill #0
  Filled 2023-11-30: qty 60, 30d supply, fill #1
  Filled 2023-12-27: qty 60, 30d supply, fill #2
  Filled 2024-02-11: qty 60, 30d supply, fill #3

## 2023-10-11 NOTE — Patient Instructions (Addendum)
 Asthma  Daily controller medication(s): continue Trelegy 200mg  1 puff once a day and rinse mouth after each use.  During respiratory infections/flares:  Start budesonide  nebulizer twice a day for 1-2 weeks until your breathing symptoms return to baseline.  Pretreat with albuterol  2 puffs or albuterol  nebulizer.  If you need to use your albuterol  nebulizer machine back to back within 15-30 minutes with no relief then please go to the ER/urgent care for further evaluation.  May use albuterol  rescue inhaler 2 puffs or nebulizer every 4 to 6 hours as needed for shortness of breath, chest tightness, coughing, and wheezing. May use albuterol  rescue inhaler 2 puffs 5 to 15 minutes prior to strenuous physical activities. Monitor frequency of use - if you need to use it more than twice per week on a consistent basis let us  know.  Breathing control goals:  Full participation in all desired activities (may need albuterol  before activity) Albuterol  use two times or less a week on average (not counting use with activity) Cough interfering with sleep two times or less a month Oral steroids no more than once a year No hospitalizations  Chronic urticaria Xolair  injections 300mg  every 4 weeks - given today.  Continue with zyrtec  10mg  daily - also for allergies.  Monitor symptoms.  Avoid the following potential triggers: alcohol , tight clothing, NSAIDs.    Seasonal and perennial allergic rhinoconjunctivitis 2020 skin testing positive to ragweed and dust mites, molds and cockroaches. Continue environmental control measures. May use over the counter antihistamines such as Zyrtec  (cetirizine ), Claritin  (loratadine ), Allegra (fexofenadine), or Xyzal (levocetirizine) daily as needed. Use Flonase  (fluticasone ) nasal spray 1-2 sprays per nostril once a day as needed for nasal congestion.  Nasal saline spray (i.e., Simply Saline) or nasal saline lavage (i.e., NeilMed) is recommended as needed and prior to medicated  nasal sprays.  Elevated blood pressure  Blood pressure reading was high in our office today. Vitals:   10/11/23 0836  BP: (!) 140/90    Make sure to keep neurology appointment.   Follow up in 3 months or sooner if needed.

## 2023-10-21 ENCOUNTER — Other Ambulatory Visit (HOSPITAL_COMMUNITY): Payer: Self-pay

## 2023-10-25 DIAGNOSIS — E785 Hyperlipidemia, unspecified: Secondary | ICD-10-CM | POA: Diagnosis not present

## 2023-10-25 DIAGNOSIS — G459 Transient cerebral ischemic attack, unspecified: Secondary | ICD-10-CM | POA: Diagnosis not present

## 2023-10-25 DIAGNOSIS — I10 Essential (primary) hypertension: Secondary | ICD-10-CM | POA: Diagnosis not present

## 2023-10-25 DIAGNOSIS — E119 Type 2 diabetes mellitus without complications: Secondary | ICD-10-CM | POA: Diagnosis not present

## 2023-10-28 ENCOUNTER — Other Ambulatory Visit: Payer: Self-pay

## 2023-10-28 ENCOUNTER — Other Ambulatory Visit (HOSPITAL_COMMUNITY): Payer: Self-pay

## 2023-10-28 NOTE — Progress Notes (Signed)
Specialty Pharmacy Refill Coordination Note  RANI IDLER is a 53 y.o. female contacted today regarding refills of specialty medication(s) Omalizumab [place] [place])   Patient requested Courier to Provider Office   Delivery date: 11/05/23   Verified address: [facility] [address]   Medication will be filled on 11/04/23.

## 2023-10-29 ENCOUNTER — Other Ambulatory Visit (HOSPITAL_COMMUNITY): Payer: Self-pay

## 2023-11-01 DIAGNOSIS — R1084 Generalized abdominal pain: Secondary | ICD-10-CM | POA: Diagnosis not present

## 2023-11-01 DIAGNOSIS — M549 Dorsalgia, unspecified: Secondary | ICD-10-CM | POA: Diagnosis not present

## 2023-11-01 DIAGNOSIS — R109 Unspecified abdominal pain: Secondary | ICD-10-CM | POA: Diagnosis not present

## 2023-11-01 DIAGNOSIS — R112 Nausea with vomiting, unspecified: Secondary | ICD-10-CM | POA: Diagnosis not present

## 2023-11-01 DIAGNOSIS — K529 Noninfective gastroenteritis and colitis, unspecified: Secondary | ICD-10-CM | POA: Diagnosis not present

## 2023-11-01 DIAGNOSIS — I10 Essential (primary) hypertension: Secondary | ICD-10-CM | POA: Diagnosis not present

## 2023-11-01 DIAGNOSIS — Z20822 Contact with and (suspected) exposure to covid-19: Secondary | ICD-10-CM | POA: Diagnosis not present

## 2023-11-01 DIAGNOSIS — Z8673 Personal history of transient ischemic attack (TIA), and cerebral infarction without residual deficits: Secondary | ICD-10-CM | POA: Diagnosis not present

## 2023-11-01 DIAGNOSIS — E119 Type 2 diabetes mellitus without complications: Secondary | ICD-10-CM | POA: Diagnosis not present

## 2023-11-01 DIAGNOSIS — R10817 Generalized abdominal tenderness: Secondary | ICD-10-CM | POA: Diagnosis not present

## 2023-11-01 DIAGNOSIS — R1110 Vomiting, unspecified: Secondary | ICD-10-CM | POA: Diagnosis not present

## 2023-11-02 ENCOUNTER — Other Ambulatory Visit (HOSPITAL_COMMUNITY): Payer: Self-pay

## 2023-11-05 ENCOUNTER — Other Ambulatory Visit (HOSPITAL_COMMUNITY): Payer: Self-pay

## 2023-11-07 DIAGNOSIS — Z111 Encounter for screening for respiratory tuberculosis: Secondary | ICD-10-CM | POA: Diagnosis not present

## 2023-11-08 ENCOUNTER — Ambulatory Visit (INDEPENDENT_AMBULATORY_CARE_PROVIDER_SITE_OTHER): Payer: Commercial Managed Care - PPO | Admitting: *Deleted

## 2023-11-08 DIAGNOSIS — L501 Idiopathic urticaria: Secondary | ICD-10-CM

## 2023-11-10 DIAGNOSIS — Z111 Encounter for screening for respiratory tuberculosis: Secondary | ICD-10-CM | POA: Diagnosis not present

## 2023-11-19 DIAGNOSIS — Z800 Family history of malignant neoplasm of digestive organs: Secondary | ICD-10-CM | POA: Diagnosis not present

## 2023-11-19 DIAGNOSIS — K5900 Constipation, unspecified: Secondary | ICD-10-CM | POA: Diagnosis not present

## 2023-11-19 DIAGNOSIS — R1030 Lower abdominal pain, unspecified: Secondary | ICD-10-CM | POA: Diagnosis not present

## 2023-11-19 DIAGNOSIS — K219 Gastro-esophageal reflux disease without esophagitis: Secondary | ICD-10-CM | POA: Diagnosis not present

## 2023-11-22 DIAGNOSIS — E119 Type 2 diabetes mellitus without complications: Secondary | ICD-10-CM | POA: Diagnosis not present

## 2023-11-22 DIAGNOSIS — I10 Essential (primary) hypertension: Secondary | ICD-10-CM | POA: Diagnosis not present

## 2023-11-22 DIAGNOSIS — E785 Hyperlipidemia, unspecified: Secondary | ICD-10-CM | POA: Diagnosis not present

## 2023-11-22 DIAGNOSIS — G459 Transient cerebral ischemic attack, unspecified: Secondary | ICD-10-CM | POA: Diagnosis not present

## 2023-11-22 DIAGNOSIS — E663 Overweight: Secondary | ICD-10-CM | POA: Diagnosis not present

## 2023-11-26 ENCOUNTER — Other Ambulatory Visit: Payer: Self-pay

## 2023-11-26 NOTE — Progress Notes (Signed)
 Specialty Pharmacy Refill Coordination Note  Shelley Mccoy is a 53 y.o. female contacted today regarding refills of specialty medication(s) Omalizumab [place] [place])   Patient requested Courier to Provider Office   Delivery date: 12/02/23   Verified address: [facility] [address]   Medication will be filled on 12/01/23.

## 2023-11-30 ENCOUNTER — Other Ambulatory Visit (HOSPITAL_COMMUNITY): Payer: Self-pay

## 2023-11-30 ENCOUNTER — Other Ambulatory Visit: Payer: Self-pay

## 2023-12-01 ENCOUNTER — Other Ambulatory Visit (HOSPITAL_COMMUNITY): Payer: Self-pay

## 2023-12-02 ENCOUNTER — Encounter (HOSPITAL_COMMUNITY): Payer: Self-pay

## 2023-12-02 ENCOUNTER — Other Ambulatory Visit (HOSPITAL_COMMUNITY): Payer: Self-pay

## 2023-12-06 ENCOUNTER — Ambulatory Visit: Payer: Commercial Managed Care - PPO

## 2023-12-09 ENCOUNTER — Ambulatory Visit (INDEPENDENT_AMBULATORY_CARE_PROVIDER_SITE_OTHER)

## 2023-12-09 DIAGNOSIS — L501 Idiopathic urticaria: Secondary | ICD-10-CM | POA: Diagnosis not present

## 2023-12-13 ENCOUNTER — Other Ambulatory Visit (HOSPITAL_COMMUNITY): Payer: Self-pay

## 2023-12-15 ENCOUNTER — Emergency Department (HOSPITAL_COMMUNITY)

## 2023-12-15 ENCOUNTER — Other Ambulatory Visit (HOSPITAL_COMMUNITY): Payer: Self-pay

## 2023-12-15 ENCOUNTER — Emergency Department (HOSPITAL_COMMUNITY)
Admission: EM | Admit: 2023-12-15 | Discharge: 2023-12-15 | Disposition: A | Discharge status: Home / Self Care | Attending: Emergency Medicine | Admitting: Emergency Medicine

## 2023-12-15 ENCOUNTER — Encounter (HOSPITAL_COMMUNITY): Payer: Self-pay | Admitting: Emergency Medicine

## 2023-12-15 ENCOUNTER — Other Ambulatory Visit: Payer: Self-pay

## 2023-12-15 DIAGNOSIS — R160 Hepatomegaly, not elsewhere classified: Secondary | ICD-10-CM | POA: Diagnosis not present

## 2023-12-15 DIAGNOSIS — S2222XA Fracture of body of sternum, initial encounter for closed fracture: Secondary | ICD-10-CM | POA: Insufficient documentation

## 2023-12-15 DIAGNOSIS — Y92410 Unspecified street and highway as the place of occurrence of the external cause: Secondary | ICD-10-CM | POA: Diagnosis not present

## 2023-12-15 DIAGNOSIS — S32048A Other fracture of fourth lumbar vertebra, initial encounter for closed fracture: Secondary | ICD-10-CM | POA: Diagnosis not present

## 2023-12-15 DIAGNOSIS — I700 Atherosclerosis of aorta: Secondary | ICD-10-CM | POA: Diagnosis not present

## 2023-12-15 DIAGNOSIS — S32009A Unspecified fracture of unspecified lumbar vertebra, initial encounter for closed fracture: Secondary | ICD-10-CM | POA: Diagnosis not present

## 2023-12-15 DIAGNOSIS — Z90710 Acquired absence of both cervix and uterus: Secondary | ICD-10-CM | POA: Diagnosis not present

## 2023-12-15 DIAGNOSIS — S0990XA Unspecified injury of head, initial encounter: Secondary | ICD-10-CM | POA: Diagnosis not present

## 2023-12-15 DIAGNOSIS — M545 Low back pain, unspecified: Secondary | ICD-10-CM | POA: Diagnosis present

## 2023-12-15 DIAGNOSIS — R0789 Other chest pain: Secondary | ICD-10-CM | POA: Diagnosis not present

## 2023-12-15 DIAGNOSIS — Z23 Encounter for immunization: Secondary | ICD-10-CM | POA: Insufficient documentation

## 2023-12-15 DIAGNOSIS — S0001XA Abrasion of scalp, initial encounter: Secondary | ICD-10-CM | POA: Insufficient documentation

## 2023-12-15 DIAGNOSIS — K760 Fatty (change of) liver, not elsewhere classified: Secondary | ICD-10-CM | POA: Diagnosis not present

## 2023-12-15 DIAGNOSIS — S2220XA Unspecified fracture of sternum, initial encounter for closed fracture: Secondary | ICD-10-CM

## 2023-12-15 DIAGNOSIS — R079 Chest pain, unspecified: Secondary | ICD-10-CM | POA: Diagnosis not present

## 2023-12-15 DIAGNOSIS — S199XXA Unspecified injury of neck, initial encounter: Secondary | ICD-10-CM | POA: Diagnosis not present

## 2023-12-15 DIAGNOSIS — S32049A Unspecified fracture of fourth lumbar vertebra, initial encounter for closed fracture: Secondary | ICD-10-CM | POA: Diagnosis not present

## 2023-12-15 DIAGNOSIS — Z041 Encounter for examination and observation following transport accident: Secondary | ICD-10-CM | POA: Diagnosis not present

## 2023-12-15 LAB — CBC
HCT: 38 % (ref 36.0–46.0)
Hemoglobin: 12.2 g/dL (ref 12.0–15.0)
MCH: 24 pg — ABNORMAL LOW (ref 26.0–34.0)
MCHC: 32.1 g/dL (ref 30.0–36.0)
MCV: 74.8 fL — ABNORMAL LOW (ref 80.0–100.0)
Platelets: 269 10*3/uL (ref 150–400)
RBC: 5.08 MIL/uL (ref 3.87–5.11)
RDW: 15.8 % — ABNORMAL HIGH (ref 11.5–15.5)
WBC: 6 10*3/uL (ref 4.0–10.5)
nRBC: 0 % (ref 0.0–0.2)

## 2023-12-15 LAB — COMPREHENSIVE METABOLIC PANEL
ALT: 27 U/L (ref 0–44)
AST: 29 U/L (ref 15–41)
Albumin: 3.7 g/dL (ref 3.5–5.0)
Alkaline Phosphatase: 103 U/L (ref 38–126)
Anion gap: 9 (ref 5–15)
BUN: 6 mg/dL (ref 6–20)
CO2: 26 mmol/L (ref 22–32)
Calcium: 9.2 mg/dL (ref 8.9–10.3)
Chloride: 102 mmol/L (ref 98–111)
Creatinine, Ser: 0.75 mg/dL (ref 0.44–1.00)
GFR, Estimated: >60 mL/min (ref >60)
Glucose, Bld: 138 mg/dL — ABNORMAL HIGH (ref 70–99)
Potassium: 4.4 mmol/L (ref 3.5–5.1)
Sodium: 137 mmol/L (ref 135–145)
Total Bilirubin: 1.2 mg/dL (ref 0.0–1.2)
Total Protein: 7.3 g/dL (ref 6.5–8.1)

## 2023-12-15 MED ORDER — FENTANYL CITRATE PF 50 MCG/ML IJ SOSY
50.0000 ug | PREFILLED_SYRINGE | Freq: Once | INTRAMUSCULAR | Status: AC
  Administered 2023-12-15: 50 ug via INTRAVENOUS
  Filled 2023-12-15: qty 1

## 2023-12-15 MED ORDER — LIDOCAINE 5 % EX PTCH
1.0000 | MEDICATED_PATCH | CUTANEOUS | Status: DC
  Administered 2023-12-15: 1 via TRANSDERMAL
  Filled 2023-12-15: qty 1

## 2023-12-15 MED ORDER — CYCLOBENZAPRINE HCL 10 MG PO TABS
10.0000 mg | ORAL_TABLET | Freq: Two times a day (BID) | ORAL | 0 refills | Status: AC | PRN
  Filled 2023-12-15: qty 20, 10d supply, fill #0

## 2023-12-15 MED ORDER — IOHEXOL 350 MG/ML SOLN
75.0000 mL | Freq: Once | INTRAVENOUS | Status: AC | PRN
  Administered 2023-12-15: 75 mL via INTRAVENOUS

## 2023-12-15 MED ORDER — TRAMADOL HCL 50 MG PO TABS
50.0000 mg | ORAL_TABLET | Freq: Four times a day (QID) | ORAL | 0 refills | Status: DC | PRN
  Filled 2023-12-15: qty 15, 4d supply, fill #0

## 2023-12-15 MED ORDER — LIDOCAINE 5 % EX PTCH
1.0000 | MEDICATED_PATCH | CUTANEOUS | 0 refills | Status: AC
  Filled 2023-12-15: qty 30, 30d supply, fill #0

## 2023-12-15 MED ORDER — TETANUS-DIPHTH-ACELL PERTUSSIS 5-2.5-18.5 LF-MCG/0.5 IM SUSY
0.5000 mL | PREFILLED_SYRINGE | Freq: Once | INTRAMUSCULAR | Status: AC
  Administered 2023-12-15: 0.5 mL via INTRAMUSCULAR
  Filled 2023-12-15: qty 0.5

## 2023-12-15 NOTE — Discharge Instructions (Signed)
 Recommend 1000 mg of Tylenol every 6 hours as needed for pain.  Lidocaine patches as prescribed.  I have prescribed you a muscle relaxant called Flexeril which is sedating and tramadol which is a narcotic pain medicine which is also sedating.  Do not mix with alcohol drugs or dangerous activities including driving.  Follow-up with your primary care doctor.  Use incentive spirometer to help prevent pneumonia.  Please return if symptoms worsen or if you develop fever cough sputum production.

## 2023-12-15 NOTE — ED Notes (Signed)
 Patient verbalized discharge instructions and given opportunity to ask questions. Patient ambulated to ED lobby.
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
## 2023-12-23 ENCOUNTER — Other Ambulatory Visit: Payer: Self-pay

## 2023-12-27 ENCOUNTER — Other Ambulatory Visit: Payer: Self-pay

## 2023-12-27 ENCOUNTER — Other Ambulatory Visit (HOSPITAL_COMMUNITY): Payer: Self-pay

## 2023-12-27 ENCOUNTER — Encounter (HOSPITAL_COMMUNITY): Payer: Self-pay

## 2023-12-27 DIAGNOSIS — E119 Type 2 diabetes mellitus without complications: Secondary | ICD-10-CM | POA: Diagnosis not present

## 2023-12-27 DIAGNOSIS — Z113 Encounter for screening for infections with a predominantly sexual mode of transmission: Secondary | ICD-10-CM | POA: Diagnosis not present

## 2023-12-27 DIAGNOSIS — Z1159 Encounter for screening for other viral diseases: Secondary | ICD-10-CM | POA: Diagnosis not present

## 2023-12-27 MED ORDER — HYDROCHLOROTHIAZIDE 12.5 MG PO TABS
12.5000 mg | ORAL_TABLET | Freq: Every day | ORAL | 3 refills | Status: AC
  Filled 2023-12-27 – 2024-03-25 (×3): qty 90, 90d supply, fill #0
  Filled 2024-06-20: qty 90, 90d supply, fill #1
  Filled 2024-10-09: qty 90, 90d supply, fill #2

## 2023-12-27 MED ORDER — VALACYCLOVIR HCL 1 G PO TABS
1000.0000 mg | ORAL_TABLET | Freq: Every day | ORAL | 3 refills | Status: AC
  Filled 2023-12-27 – 2024-03-25 (×3): qty 90, 90d supply, fill #0
  Filled 2024-06-20: qty 90, 90d supply, fill #1
  Filled 2024-10-09: qty 90, 90d supply, fill #2
  Filled 2024-10-18: qty 90, 90d supply, fill #0

## 2023-12-27 MED ORDER — LOSARTAN POTASSIUM 50 MG PO TABS
50.0000 mg | ORAL_TABLET | Freq: Every day | ORAL | 3 refills | Status: AC
  Filled 2023-12-27 – 2024-03-25 (×3): qty 90, 90d supply, fill #0
  Filled 2024-06-20: qty 90, 90d supply, fill #1
  Filled 2024-10-09: qty 90, 90d supply, fill #2

## 2023-12-27 MED ORDER — ALBUTEROL SULFATE HFA 108 (90 BASE) MCG/ACT IN AERS
2.0000 | INHALATION_SPRAY | RESPIRATORY_TRACT | 3 refills | Status: AC
  Filled 2023-12-27: qty 13.4, 34d supply, fill #0
  Filled 2024-01-20 – 2024-03-25 (×2): qty 13.4, 34d supply, fill #1
  Filled 2024-04-25: qty 13.4, 34d supply, fill #2
  Filled 2024-06-04: qty 13.4, 34d supply, fill #3

## 2023-12-27 MED ORDER — ROSUVASTATIN CALCIUM 20 MG PO TABS
20.0000 mg | ORAL_TABLET | Freq: Every day | ORAL | 3 refills | Status: AC
  Filled 2023-12-27 – 2024-04-19 (×3): qty 90, 90d supply, fill #0
  Filled 2024-08-07: qty 90, 90d supply, fill #1

## 2023-12-27 MED ORDER — TRULICITY 0.75 MG/0.5ML ~~LOC~~ SOAJ
0.7500 mg | SUBCUTANEOUS | 3 refills | Status: AC
  Filled 2023-12-27: qty 2, 4d supply, fill #0
  Filled 2024-01-20 – 2024-04-19 (×3): qty 2, 28d supply, fill #0
  Filled 2024-05-16: qty 2, 28d supply, fill #1
  Filled 2024-06-15 – 2024-06-27 (×2): qty 2, 28d supply, fill #2
  Filled 2024-08-07: qty 2, 28d supply, fill #3

## 2023-12-27 MED ORDER — GABAPENTIN 300 MG PO CAPS
300.0000 mg | ORAL_CAPSULE | Freq: Three times a day (TID) | ORAL | 3 refills | Status: AC
  Filled 2023-12-27 – 2024-04-19 (×3): qty 270, 90d supply, fill #0
  Filled 2024-08-07: qty 270, 90d supply, fill #1
  Filled 2024-10-18: qty 270, 90d supply, fill #0

## 2023-12-27 MED ORDER — SERTRALINE HCL 100 MG PO TABS
100.0000 mg | ORAL_TABLET | Freq: Every day | ORAL | 3 refills | Status: AC
  Filled 2023-12-27 – 2024-03-25 (×3): qty 90, 90d supply, fill #0
  Filled 2024-06-20: qty 90, 90d supply, fill #1
  Filled 2024-10-09: qty 90, 90d supply, fill #2

## 2023-12-27 NOTE — Progress Notes (Signed)
 Specialty Pharmacy Refill Coordination Note  Shelley Mccoy is a 53 y.o. female contacted today regarding refills of specialty medication(s) Omalizumab Geoffry Paradise)   Patient requested Courier to Provider Office   Delivery date: 12/30/23   Verified address: [facility] [address] [place] [address]   Medication will be filled on 12/29/23.

## 2023-12-28 ENCOUNTER — Other Ambulatory Visit: Payer: Self-pay

## 2024-01-03 ENCOUNTER — Other Ambulatory Visit (HOSPITAL_COMMUNITY): Payer: Self-pay

## 2024-01-03 DIAGNOSIS — G459 Transient cerebral ischemic attack, unspecified: Secondary | ICD-10-CM | POA: Diagnosis not present

## 2024-01-04 ENCOUNTER — Other Ambulatory Visit (HOSPITAL_COMMUNITY): Payer: Self-pay

## 2024-01-06 ENCOUNTER — Other Ambulatory Visit (HOSPITAL_COMMUNITY): Payer: Self-pay

## 2024-01-06 ENCOUNTER — Ambulatory Visit

## 2024-01-06 DIAGNOSIS — G459 Transient cerebral ischemic attack, unspecified: Secondary | ICD-10-CM | POA: Diagnosis not present

## 2024-01-07 ENCOUNTER — Other Ambulatory Visit (HOSPITAL_COMMUNITY): Payer: Self-pay

## 2024-01-07 ENCOUNTER — Ambulatory Visit (INDEPENDENT_AMBULATORY_CARE_PROVIDER_SITE_OTHER)

## 2024-01-07 DIAGNOSIS — L501 Idiopathic urticaria: Secondary | ICD-10-CM

## 2024-01-10 ENCOUNTER — Ambulatory Visit: Payer: Commercial Managed Care - PPO | Admitting: Allergy

## 2024-01-11 ENCOUNTER — Encounter (HOSPITAL_COMMUNITY): Payer: Self-pay

## 2024-01-11 ENCOUNTER — Other Ambulatory Visit (HOSPITAL_COMMUNITY): Payer: Self-pay

## 2024-01-13 ENCOUNTER — Other Ambulatory Visit (HOSPITAL_COMMUNITY): Payer: Self-pay

## 2024-01-17 DIAGNOSIS — K635 Polyp of colon: Secondary | ICD-10-CM | POA: Diagnosis not present

## 2024-01-17 DIAGNOSIS — R1030 Lower abdominal pain, unspecified: Secondary | ICD-10-CM | POA: Diagnosis not present

## 2024-01-17 DIAGNOSIS — K449 Diaphragmatic hernia without obstruction or gangrene: Secondary | ICD-10-CM | POA: Diagnosis not present

## 2024-01-17 DIAGNOSIS — K5900 Constipation, unspecified: Secondary | ICD-10-CM | POA: Diagnosis not present

## 2024-01-17 DIAGNOSIS — K5730 Diverticulosis of large intestine without perforation or abscess without bleeding: Secondary | ICD-10-CM | POA: Diagnosis not present

## 2024-01-17 DIAGNOSIS — K219 Gastro-esophageal reflux disease without esophagitis: Secondary | ICD-10-CM | POA: Diagnosis not present

## 2024-01-17 DIAGNOSIS — K621 Rectal polyp: Secondary | ICD-10-CM | POA: Diagnosis not present

## 2024-01-17 DIAGNOSIS — K648 Other hemorrhoids: Secondary | ICD-10-CM | POA: Diagnosis not present

## 2024-01-17 DIAGNOSIS — K760 Fatty (change of) liver, not elsewhere classified: Secondary | ICD-10-CM | POA: Diagnosis not present

## 2024-01-17 DIAGNOSIS — K2950 Unspecified chronic gastritis without bleeding: Secondary | ICD-10-CM | POA: Diagnosis not present

## 2024-01-17 DIAGNOSIS — Z800 Family history of malignant neoplasm of digestive organs: Secondary | ICD-10-CM | POA: Diagnosis not present

## 2024-01-19 ENCOUNTER — Other Ambulatory Visit: Payer: Self-pay

## 2024-01-19 DIAGNOSIS — Z1231 Encounter for screening mammogram for malignant neoplasm of breast: Secondary | ICD-10-CM

## 2024-01-20 ENCOUNTER — Other Ambulatory Visit: Payer: Self-pay | Admitting: Internal Medicine

## 2024-01-20 ENCOUNTER — Encounter (HOSPITAL_COMMUNITY): Payer: Self-pay

## 2024-01-20 ENCOUNTER — Other Ambulatory Visit (HOSPITAL_COMMUNITY): Payer: Self-pay

## 2024-01-20 ENCOUNTER — Other Ambulatory Visit: Payer: Self-pay

## 2024-01-21 ENCOUNTER — Other Ambulatory Visit: Payer: Self-pay

## 2024-01-24 ENCOUNTER — Encounter (HOSPITAL_COMMUNITY): Payer: Self-pay

## 2024-01-24 ENCOUNTER — Other Ambulatory Visit (HOSPITAL_COMMUNITY): Payer: Self-pay

## 2024-01-24 DIAGNOSIS — I10 Essential (primary) hypertension: Secondary | ICD-10-CM | POA: Diagnosis not present

## 2024-01-24 DIAGNOSIS — G459 Transient cerebral ischemic attack, unspecified: Secondary | ICD-10-CM | POA: Diagnosis not present

## 2024-01-25 ENCOUNTER — Encounter

## 2024-01-25 DIAGNOSIS — Z1231 Encounter for screening mammogram for malignant neoplasm of breast: Secondary | ICD-10-CM

## 2024-01-26 ENCOUNTER — Other Ambulatory Visit: Payer: Self-pay

## 2024-01-26 DIAGNOSIS — K620 Anal polyp: Secondary | ICD-10-CM | POA: Diagnosis not present

## 2024-01-26 NOTE — Progress Notes (Signed)
 Specialty Pharmacy Refill Coordination Note  Shelley Mccoy is a 53 y.o. female contacted today regarding refills of specialty medication(s) Omalizumab  (XOLAIR )   Patient requested Courier to Provider Office   Delivery date: 01/31/24   Verified address: [facility] [place] - [address] [place], [place]   Medication will be filled on 01/28/2024. This fill date is pending response to refill request from provider. Patient is aware and if they have not received fill by intended date they must follow up with pharmacy.

## 2024-01-26 NOTE — Progress Notes (Signed)
 Specialty Pharmacy Ongoing Clinical Assessment Note  Shelley Mccoy is a 53 y.o. female who is being followed by the specialty pharmacy service for RxSp Allergy    Patient's specialty medication(s) reviewed today: Omalizumab  (XOLAIR )   Missed doses in the last 4 weeks: 0   Patient/Caregiver did not have any additional questions or concerns.   Therapeutic benefit summary: Patient is achieving benefit   Adverse events/side effects summary: No adverse events/side effects   Patient's therapy is appropriate to: Continue    Goals Addressed             This Visit's Progress    Reduce signs and symptoms   On track    Patient is on track. Patient will maintain adherence         Follow up:  6 months  [facility] Specialty Pharmacist

## 2024-01-27 ENCOUNTER — Other Ambulatory Visit: Payer: Self-pay | Admitting: Family Medicine

## 2024-01-27 ENCOUNTER — Ambulatory Visit: Admitting: Pharmacist

## 2024-01-27 ENCOUNTER — Other Ambulatory Visit (HOSPITAL_COMMUNITY): Payer: Self-pay

## 2024-01-27 ENCOUNTER — Other Ambulatory Visit: Payer: Self-pay

## 2024-01-27 ENCOUNTER — Encounter: Payer: Self-pay | Admitting: Pharmacist

## 2024-01-27 DIAGNOSIS — Z79899 Other long term (current) drug therapy: Secondary | ICD-10-CM

## 2024-01-27 MED ORDER — OMALIZUMAB 150 MG/ML ~~LOC~~ SOSY
300.0000 mg | PREFILLED_SYRINGE | SUBCUTANEOUS | 11 refills | Status: DC
  Filled 2024-01-27: qty 2, 28d supply, fill #0

## 2024-01-27 MED ORDER — OMALIZUMAB 150 MG/ML ~~LOC~~ SOSY
300.0000 mg | PREFILLED_SYRINGE | SUBCUTANEOUS | 11 refills | Status: AC
  Filled 2024-01-27: qty 2, 28d supply, fill #0
  Filled 2024-02-25: qty 2, 28d supply, fill #1
  Filled 2024-03-30: qty 2, 28d supply, fill #2
  Filled 2024-05-01: qty 2, 28d supply, fill #3
  Filled 2024-05-24: qty 2, 28d supply, fill #4
  Filled 2024-06-26: qty 2, 28d supply, fill #5
  Filled 2024-07-31: qty 2, 28d supply, fill #6
  Filled 2024-08-22: qty 2, 28d supply, fill #7
  Filled 2024-09-19: qty 2, 28d supply, fill #8
  Filled 2024-10-18: qty 2, 28d supply, fill #9

## 2024-01-27 NOTE — Progress Notes (Signed)
   S: Patient presents for review of their specialty medication therapy.  Patient is currently taking Xolair  for chronic urticaria. Patient is managed by Dr. Burdette Carolin for this.   Adherence: confirms  Efficacy: reports good results  Dosing: Give subcutaneously.  Can be dosed every 2 or 4 weeks based on baseline serum IgE levels and body weight. Chronic idiopathic urticaria: SubQ: 150 or 300 mg every 4 weeks. Dosing is not dependent on serum IgE (free or total) level or body weight.  Dose adjustments: Renal: no dose adjustments  Hepatic: no dose adjustments  Toxicity: Severe hypersensitivity reaction or anaphylaxis: Discontinue treatment. Fever, arthralgia, and rash: Discontinue treatment if this constellation of symptoms occurs.  Drug-drug interactions: none identified   Monitoring: CV effects: denied  Eosinophilia and vasculitis: denied  Fever/arthralgia/rash: denied  Hypersensitivity/Anaphylaxis: denied  Malignant neoplasms: denied  Pulmonary function tests (for asthma): denied    O:     Lab Results  Component Value Date   WBC 6.0 12/15/2023   HGB 12.2 12/15/2023   HCT 38.0 12/15/2023   MCV 74.8 (L) 12/15/2023   PLT 269 12/15/2023      Chemistry      Component Value Date/Time   NA 137 12/15/2023 0831   NA 139 03/23/2019 1601   K 4.4 12/15/2023 0831   CL 102 12/15/2023 0831   CO2 26 12/15/2023 0831   BUN 6 12/15/2023 0831   BUN 15 03/23/2019 1601   CREATININE 0.75 12/15/2023 0831      Component Value Date/Time   CALCIUM  9.2 12/15/2023 0831   ALKPHOS 103 12/15/2023 0831   AST 29 12/15/2023 0831   ALT 27 12/15/2023 0831   BILITOT 1.2 12/15/2023 0831   BILITOT 0.6 03/23/2019 1601       A/P: 1. Medication review: patient currently on Xolair  for chronic urticaria. Reviewed the medication with the patient, including the following: Xolair , omalizumab , is a novel IgE blocker.  It appears to reduce rates of hospitalizations, ER visits and unscheduled physician  visits due asthma exacerbations when added to standard therapy.  Studies also show a reduction in steroid requirements and improvement in quality of life.  Patient educated on purpose, proper use and potential adverse effects of Xolair .  Following instruction patient verbalized understanding. Patient should always have an EpiPen  readily available in the event of anaphylaxis. SubQ: For SubQ injection only; doses >150 mg should be divided over more than one injection site (eg, 225 mg or 300 mg administered as two injections, 375 mg administered as three injections); each injection site should be separated by >=1 inch. Do not inject into moles, scars, bruises, tender areas, or broken skin. Injections may take 5 to 10 seconds to administer (solution is slightly viscous). Administer only under direct medical supervision and observe patient for 2 hours after the first 3 injections and 30 minutes after subsequent injections Ary [place] 2015) or in accordance with individual institution policies and procedures.No recommendations for any changes at this time.   Marene Shape, PharmD, Becky Bowels, CPP Clinical Pharmacist [facility] [phone]

## 2024-01-28 ENCOUNTER — Encounter (HOSPITAL_COMMUNITY): Payer: Self-pay

## 2024-01-28 ENCOUNTER — Other Ambulatory Visit: Payer: Self-pay

## 2024-01-28 ENCOUNTER — Other Ambulatory Visit (HOSPITAL_COMMUNITY): Payer: Self-pay

## 2024-01-30 ENCOUNTER — Other Ambulatory Visit (HOSPITAL_COMMUNITY): Payer: Self-pay

## 2024-01-31 ENCOUNTER — Other Ambulatory Visit (HOSPITAL_COMMUNITY): Payer: Self-pay

## 2024-01-31 MED ORDER — DEXCOM G6 TRANSMITTER MISC
3 refills | Status: DC
  Filled 2024-01-31: qty 1, 90d supply, fill #0

## 2024-01-31 MED ORDER — DEXCOM G6 SENSOR MISC
11 refills | Status: DC
Start: 2024-01-30 — End: 2024-02-03
  Filled 2024-01-31: qty 3, 30d supply, fill #0

## 2024-01-31 MED ORDER — DEXCOM G6 RECEIVER DEVI
0 refills | Status: DC
  Filled 2024-01-31: qty 1, 30d supply, fill #0

## 2024-02-01 ENCOUNTER — Other Ambulatory Visit (HOSPITAL_COMMUNITY): Payer: Self-pay

## 2024-02-02 ENCOUNTER — Other Ambulatory Visit (HOSPITAL_COMMUNITY): Payer: Self-pay

## 2024-02-02 MED ORDER — DEXCOM G7 SENSOR MISC
0 refills | Status: DC
Start: 2024-02-02 — End: 2024-02-02
  Filled 2024-02-02: qty 3, 30d supply, fill #0

## 2024-02-03 ENCOUNTER — Other Ambulatory Visit (HOSPITAL_COMMUNITY): Payer: Self-pay

## 2024-02-03 MED ORDER — DEXCOM G7 SENSOR MISC
6 refills | Status: AC
Start: 2024-02-03 — End: ?
  Filled 2024-02-09: qty 1, 10d supply, fill #0
  Filled 2024-02-11: qty 3, 30d supply, fill #0
  Filled 2024-02-11: qty 1, 10d supply, fill #0
  Filled 2024-03-25: qty 3, 30d supply, fill #1
  Filled 2024-04-25: qty 3, 30d supply, fill #2
  Filled 2024-04-28: qty 1, 10d supply, fill #2

## 2024-02-03 MED ORDER — DEXCOM G7 SENSOR MISC
0 refills | Status: DC
Start: 2024-02-03 — End: 2024-02-03
  Filled 2024-02-03 (×2): qty 1, 10d supply, fill #0

## 2024-02-03 MED ORDER — DEXCOM G7 RECEIVER DEVI
0 refills | Status: DC
  Filled 2024-02-03 (×2): qty 1, 90d supply, fill #0

## 2024-02-03 MED ORDER — DEXCOM G7 RECEIVER DEVI: 6 refills | Status: DC

## 2024-02-04 ENCOUNTER — Ambulatory Visit

## 2024-02-09 ENCOUNTER — Ambulatory Visit

## 2024-02-09 ENCOUNTER — Other Ambulatory Visit (HOSPITAL_COMMUNITY): Payer: Self-pay

## 2024-02-09 DIAGNOSIS — L501 Idiopathic urticaria: Secondary | ICD-10-CM

## 2024-02-11 ENCOUNTER — Other Ambulatory Visit: Payer: Self-pay | Admitting: Allergy

## 2024-02-11 ENCOUNTER — Other Ambulatory Visit: Payer: Self-pay

## 2024-02-11 ENCOUNTER — Other Ambulatory Visit (HOSPITAL_COMMUNITY): Payer: Self-pay

## 2024-02-14 ENCOUNTER — Ambulatory Visit: Admitting: Allergy

## 2024-02-15 ENCOUNTER — Other Ambulatory Visit (HOSPITAL_COMMUNITY): Payer: Self-pay

## 2024-02-15 ENCOUNTER — Other Ambulatory Visit: Payer: Self-pay

## 2024-02-15 MED ORDER — BUDESONIDE 0.5 MG/2ML IN SUSP
0.5000 mg | Freq: Two times a day (BID) | RESPIRATORY_TRACT | 0 refills | Status: DC
  Filled 2024-02-15 – 2024-04-19 (×2): qty 120, 30d supply, fill #0

## 2024-02-15 NOTE — Progress Notes (Signed)
 Follow Up Note  RE: Shelley Mccoy MRN: [id number] DOB: Jul 18, 1971 Date of Office Visit: 02/16/2024  Referring provider: Claven Cumming, * Primary care provider: Claven Cumming, MD  Chief Complaint: Asthma (3 mth f/u - ) and Urticaria (3 mth f/u- doing good)  History of Present Illness: I had the pleasure of seeing Shelley Mccoy for a follow up visit at the [facility] on 02/16/2024. She is a 53 y.o. female, who is being followed for CIU on Xolair , asthma, allergic rhinoconjunctivitis. Her previous allergy  office visit was on 10/11/2023 with Dr. Burdette Carolin. Today is a regular follow up visit.  Discussed the use of AI scribe software for clinical note transcription with the patient, who gave verbal consent to proceed.    Her chronic urticaria is well-controlled with no recent breakouts. She receives Xolair  injections every four weeks and takes Zyrtec  daily. She has not experienced itching recently.   Her asthma is well-managed with Trelegy, which she uses one puff daily before work. She finds it effective and has not needed her nebulizer frequently, only using it when exposed to grass cutting. She has not required emergency care or prednisone  recently and feels her inhaler regimen is effective.  She mentions a past visit to a neurologist, which led to a referral to cardiology. She feels back to normal but is uncertain about when symptoms might recur. No fever, chills, and reports normal eating and bathroom habits.  She monitors her blood pressure at home, noting improvement when taking medication at night.   She has a cyst on her right neck, which does not bother her.     Assessment and Plan: Shelley Mccoy is a 53 y.o. female with: Chronic idiopathic urticaria Past history - Breaking out in rash/hives for the past 3 years. Urticaria panel unremarkable. Unable to wean to every 5 weeks.  Interim history - no flares.  Xolair  injections 300mg  every 4 weeks.  Continue  with zyrtec  10mg  daily - also for allergies.  Monitor symptoms.  Avoid the following potential triggers: alcohol, tight clothing, NSAIDs.    Moderate persistent asthma without complication Past history - 2020 spirometry showed: possible restrictive disease and 21% improvement in FEV1 post bronchodilator treatment.  Interim history - well controlled.  Today's spirometry showed some restriction. Daily controller medication(s): continue Trelegy 200mg  1 puff once a day and rinse mouth after each use.  During respiratory infections/flares:  Start budesonide  nebulizer twice a day for 1-2 weeks until your breathing symptoms return to baseline.  Pretreat with albuterol  2 puffs or albuterol  nebulizer.  If you need to use your albuterol  nebulizer machine back to back within 15-30 minutes with no relief then please go to the ER/urgent care for further evaluation.  May use albuterol  rescue inhaler 2 puffs or nebulizer every 4 to 6 hours as needed for shortness of breath, chest tightness, coughing, and wheezing. May use albuterol  rescue inhaler 2 puffs 5 to 15 minutes prior to strenuous physical activities. Monitor frequency of use - if you need to use it more than twice per week on a consistent basis let us  know.    Seasonal allergic rhinitis due to pollen Allergic rhinitis due to dust mite Allergy  to cockroaches Allergic conjunctivitis of both eyes Past history - 2020 skin testing positive to ragweed, dust mites, molds and cockroaches. Fresh cut grass is a trigger.  Interim history - fresh cut grass is a trigger.  Continue environmental control measures. May use over the counter antihistamines such as Zyrtec  (  cetirizine ), Claritin  (loratadine ), Allegra (fexofenadine), or Xyzal (levocetirizine) daily as needed. Use Flonase  (fluticasone ) nasal spray 1-2 sprays per nostril once a day as needed for nasal congestion.  Nasal saline spray (i.e., Simply Saline) or nasal saline lavage (i.e., NeilMed) is  recommended as needed and prior to medicated nasal sprays.  Cyst on neck Cyst on right neck. No discomfort reported. Recommend dermatology evaluation for removal.   Return in about 6 months (around 08/18/2024).  Meds ordered this encounter  Medications   Fluticasone -Umeclidin-Vilant (TRELEGY ELLIPTA ) 200-62.5-25 MCG/ACT AEPB    Sig: Inhale 1 puff into the lungs daily. Rinse mouth after each use.    Dispense:  60 each    Refill:  5   cetirizine  (ZYRTEC ) 10 MG tablet    Sig: Take 1 tablet (10 mg total) by mouth daily.    Dispense:  90 tablet    Refill:  3   Lab Orders  No laboratory test(s) ordered today    Diagnostics: Spirometry:  Tracings reviewed. Her effort: Good reproducible efforts. FVC: 1.71L FEV1: 1.33L, 66% predicted FEV1/FVC ratio: 78% Interpretation: Spirometry consistent with possible restrictive disease.  Please see scanned spirometry results for details. Results discussed with patient/family.   Medication List:  Current Outpatient Medications  Medication Sig Dispense Refill   albuterol  (PROVENTIL ) (2.5 MG/3ML) 0.083% nebulizer solution Take 3 mLs (2.5 mg total) by nebulization every 4 (four) hours as needed for wheezing or shortness of breath (coughing fits). 75 mL 1   albuterol  (VENTOLIN  HFA) 108 (90 Base) MCG/ACT inhaler Inhale 2 puffs into the lungs every 4 (four) hours. 13.4 g 3   Ascorbic Acid (VITAMIN C PO) Take 1 tablet by mouth daily.     budesonide  (PULMICORT ) 0.5 MG/2ML nebulizer solution Inhale 2 mLs (0.5 mg) by nebulization in the morning and at bedtime. Take for 1-2 weeks during asthma flare. 120 mL 0   conjugated estrogens  (PREMARIN ) vaginal cream Place 1 Applicatorful vaginally daily. (Patient taking differently: Place 1 Application vaginally See admin instructions. Insert 1 Applicatorful into the vagina weekly for vaginal irritation) 30 g 1   Continuous Glucose Sensor (DEXCOM G7 SENSOR) MISC Use as directed to continuously monitor blood sugar,  changing the sensor every 10 days. 1 each 6   cyclobenzaprine  (FLEXERIL ) 10 MG tablet Take 1 tablet (10 mg total) by mouth 2 (two) times daily as needed for muscle spasms. 20 tablet 0   Dulaglutide  (TRULICITY ) 0.75 MG/0.5ML SOAJ Inject 0.75 mg into the skin once a week. 2 mL 3   EPINEPHrine  0.3 mg/0.3 mL IJ SOAJ injection Use as directed as needed 2 each 4   estradiol  (ESTRACE ) 0.1 MG/GM vaginal cream Insert 1 gram vaginally daily for 14 days, for vaginal irritation 42.5 g 1   famotidine  (PEPCID ) 20 MG tablet Take 1 tablet (20 mg total) by mouth 2 (two) times daily. 180 tablet 3   Ferrous Sulfate (IRON PO) Take 1 tablet by mouth daily.     fluticasone  (FLONASE ) 50 MCG/ACT nasal spray Place 2 sprays into both nostrils daily. 16 g 3   gabapentin  (NEURONTIN ) 300 MG capsule Take 1 capsule (300 mg total) by mouth 3 (three) times daily. 270 capsule 3   glucose blood (FREESTYLE LITE) test strip Use 1 strip 2 (two) times daily. 150 strip 3   hydrochlorothiazide  (HYDRODIURIL ) 12.5 MG tablet Take 1 tablet (12.5 mg total) by mouth daily. 90 tablet 3   lidocaine  (LIDODERM ) 5 % Place 1 patch onto the skin daily. Remove & Discard patch within 12  hours or as directed by MD 30 patch 0   losartan  (COZAAR ) 50 MG tablet Take 1 tablet (50 mg total) by mouth daily. 90 tablet 3   omalizumab  (XOLAIR ) 150 MG/ML prefilled syringe Inject 300 mg into the skin every 28 (twenty-eight) days. 2 mL 11   Omega-3 Fatty Acids (FISH OIL) 1000 MG CAPS Take 1,000 mg by mouth daily.     rosuvastatin  (CRESTOR ) 20 MG tablet Take 1 tablet (20 mg total) by mouth daily. 90 tablet 3   sertraline  (ZOLOFT ) 100 MG tablet Take 1 tablet (100 mg total) by mouth daily. 90 tablet 3   triamcinolone  ointment (KENALOG ) 0.1 % Apply 1 Application topically daily to affected area. 30 g 4   valACYclovir  (VALTREX ) 1000 MG tablet Take 1 tablet (1,000 mg total) by mouth daily. 90 tablet 3   Vitamin D , Ergocalciferol , 50000 units CAPS Take 1 capsule by mouth  once a week. 13 capsule 3   cetirizine  (ZYRTEC ) 10 MG tablet Take 1 tablet (10 mg total) by mouth daily. 90 tablet 3   Fluticasone -Umeclidin-Vilant (TRELEGY ELLIPTA ) 200-62.5-25 MCG/ACT AEPB Inhale 1 puff into the lungs daily. Rinse mouth after each use. 60 each 5   Current Facility-Administered Medications  Medication Dose Route Frequency Provider Last Rate Last Admin   omalizumab  (XOLAIR ) injection 300 mg  300 mg Subcutaneous Q28 days [place] Chuck, MD   300 mg at 02/09/24 1325   Allergies: Allergies  Allergen Reactions   Percocet [Oxycodone -Acetaminophen ] Hives   I reviewed her past medical history, social history, family history, and environmental history and no significant changes have been reported from her previous visit.  Review of Systems  Constitutional:  Negative for appetite change, chills, fever and unexpected weight change.  HENT:  Negative for congestion.   Eyes:  Negative for itching.  Respiratory:  Negative for cough, chest tightness, shortness of breath and wheezing.   Cardiovascular:  Negative for chest pain.  Gastrointestinal:  Negative for abdominal pain.  Genitourinary:  Negative for difficulty urinating.  Skin:  Negative for rash.  Allergic/Immunologic: Positive for environmental allergies. Negative for food allergies.    Objective: BP 132/82 (BP Location: Left Arm, Patient Position: Sitting, Cuff Size: Large)   Pulse 66   Temp 97.7 F (36.5 C) (Temporal)   Resp 16   Ht 5' (1.524 m)   Wt 232 lb 11.2 oz (105.6 kg)   SpO2 98%   BMI 45.45 kg/m  Body mass index is 45.45 kg/m. Physical Exam Vitals and nursing note reviewed.  Constitutional:      Appearance: Normal appearance. She is well-developed. She is obese.  HENT:     Head: Normocephalic and atraumatic.     Right Ear: Tympanic membrane and external ear normal.     Left Ear: Tympanic membrane and external ear normal.     Nose: Nose normal.     Mouth/Throat:     Mouth: Mucous membranes  are moist.     Pharynx: Oropharynx is clear.  Eyes:     Conjunctiva/sclera: Conjunctivae normal.  Cardiovascular:     Rate and Rhythm: Normal rate and regular rhythm.     Heart sounds: Normal heart sounds. No murmur heard.    No friction rub. No gallop.  Pulmonary:     Effort: Pulmonary effort is normal.     Breath sounds: Normal breath sounds. No wheezing, rhonchi or rales.  Musculoskeletal:     Cervical back: Neck supple.  Skin:    General: Skin is warm.  Findings: No rash.     Comments: Right neck - dime sized cyst noted.  Neurological:     Mental Status: She is alert and oriented to person, place, and time.  Psychiatric:        Behavior: Behavior normal.    Previous notes and tests were reviewed. The plan was reviewed with the patient/family, and all questions/concerned were addressed.  It was my pleasure to see Clarece today and participate in her care. Please feel free to contact me with any questions or concerns.  Sincerely,  Eudelia Hero, DO Allergy  & Immunology  [facility]  [place] office: [phone] [facility] office: [phone]

## 2024-02-16 ENCOUNTER — Other Ambulatory Visit (HOSPITAL_COMMUNITY): Payer: Self-pay

## 2024-02-16 ENCOUNTER — Encounter: Payer: Self-pay | Admitting: Allergy

## 2024-02-16 ENCOUNTER — Other Ambulatory Visit: Payer: Self-pay

## 2024-02-16 ENCOUNTER — Ambulatory Visit (INDEPENDENT_AMBULATORY_CARE_PROVIDER_SITE_OTHER): Admitting: Allergy

## 2024-02-16 ENCOUNTER — Encounter (HOSPITAL_COMMUNITY): Payer: Self-pay

## 2024-02-16 VITALS — BP 132/82 | HR 66 | Temp 97.7°F | Resp 16 | Ht 60.0 in | Wt 232.7 lb

## 2024-02-16 DIAGNOSIS — J4540 Moderate persistent asthma, uncomplicated: Secondary | ICD-10-CM | POA: Diagnosis not present

## 2024-02-16 DIAGNOSIS — J301 Allergic rhinitis due to pollen: Secondary | ICD-10-CM

## 2024-02-16 DIAGNOSIS — L729 Follicular cyst of the skin and subcutaneous tissue, unspecified: Secondary | ICD-10-CM | POA: Diagnosis not present

## 2024-02-16 DIAGNOSIS — Z91038 Other insect allergy status: Secondary | ICD-10-CM

## 2024-02-16 DIAGNOSIS — L501 Idiopathic urticaria: Secondary | ICD-10-CM

## 2024-02-16 DIAGNOSIS — J3089 Other allergic rhinitis: Secondary | ICD-10-CM

## 2024-02-16 DIAGNOSIS — H1013 Acute atopic conjunctivitis, bilateral: Secondary | ICD-10-CM

## 2024-02-16 MED ORDER — TRELEGY ELLIPTA 200-62.5-25 MCG/ACT IN AEPB
1.0000 | INHALATION_SPRAY | Freq: Every day | RESPIRATORY_TRACT | 5 refills | Status: DC
  Filled 2024-02-16 – 2024-04-19 (×2): qty 60, 30d supply, fill #0
  Filled 2024-05-16: qty 60, 30d supply, fill #1
  Filled 2024-06-15 – 2024-06-27 (×2): qty 60, 30d supply, fill #2

## 2024-02-16 MED ORDER — CETIRIZINE HCL 10 MG PO TABS
10.0000 mg | ORAL_TABLET | Freq: Every day | ORAL | 3 refills | Status: DC
  Filled 2024-02-16: qty 100, 100d supply, fill #0
  Filled 2024-04-19: qty 90, 90d supply, fill #0

## 2024-02-16 NOTE — Patient Instructions (Addendum)
 Asthma  Normal breathing test.  Daily controller medication(s): continue Trelegy 200mg  1 puff once a day and rinse mouth after each use.  During respiratory infections/flares:  Start budesonide  nebulizer twice a day for 1-2 weeks until your breathing symptoms return to baseline.  Pretreat with albuterol  2 puffs or albuterol  nebulizer.  If you need to use your albuterol  nebulizer machine back to back within 15-30 minutes with no relief then please go to the ER/urgent care for further evaluation.  May use albuterol  rescue inhaler 2 puffs or nebulizer every 4 to 6 hours as needed for shortness of breath, chest tightness, coughing, and wheezing. May use albuterol  rescue inhaler 2 puffs 5 to 15 minutes prior to strenuous physical activities. Monitor frequency of use - if you need to use it more than twice per week on a consistent basis let us  know.  Breathing control goals:  Full participation in all desired activities (may need albuterol  before activity) Albuterol  use two times or less a week on average (not counting use with activity) Cough interfering with sleep two times or less a month Oral steroids no more than once a year No hospitalizations  Chronic urticaria Xolair  injections 300mg  every 4 weeks.  Continue with zyrtec  10mg  daily - also for allergies.  Monitor symptoms.  Avoid the following potential triggers: alcohol, tight clothing, NSAIDs.    Seasonal and perennial allergic rhinoconjunctivitis 2020 skin testing positive to ragweed and dust mites, molds and cockroaches. Continue environmental control measures. May use over the counter antihistamines such as Zyrtec  (cetirizine ), Claritin  (loratadine ), Allegra (fexofenadine), or Xyzal (levocetirizine) daily as needed. Use Flonase  (fluticasone ) nasal spray 1-2 sprays per nostril once a day as needed for nasal congestion.  Nasal saline spray (i.e., Simply Saline) or nasal saline lavage (i.e., NeilMed) is recommended as needed and prior to  medicated nasal sprays.  Make sure to keep neurology appointment.   Follow up in 6 months or sooner if needed.

## 2024-02-23 ENCOUNTER — Other Ambulatory Visit (HOSPITAL_COMMUNITY): Payer: Self-pay

## 2024-02-25 ENCOUNTER — Other Ambulatory Visit: Payer: Self-pay

## 2024-02-25 ENCOUNTER — Other Ambulatory Visit (HOSPITAL_COMMUNITY): Payer: Self-pay

## 2024-02-25 NOTE — Progress Notes (Signed)
 Specialty Pharmacy Refill Coordination Note  MELYNA HURON is a 53 y.o. female contacted today regarding refills of specialty medication(s) Omalizumab  (XOLAIR )   Patient requested Courier to Provider Office   Delivery date: 03/09/24   Verified address: [facility] - [address] [place], [place]   Medication will be filled on 03/08/24.

## 2024-02-28 ENCOUNTER — Other Ambulatory Visit (HOSPITAL_COMMUNITY): Payer: Self-pay

## 2024-03-02 ENCOUNTER — Other Ambulatory Visit (HOSPITAL_COMMUNITY): Payer: Self-pay

## 2024-03-02 DIAGNOSIS — Z79899 Other long term (current) drug therapy: Secondary | ICD-10-CM | POA: Diagnosis not present

## 2024-03-02 DIAGNOSIS — E119 Type 2 diabetes mellitus without complications: Secondary | ICD-10-CM | POA: Diagnosis not present

## 2024-03-02 DIAGNOSIS — Z794 Long term (current) use of insulin: Secondary | ICD-10-CM | POA: Diagnosis not present

## 2024-03-02 DIAGNOSIS — K620 Anal polyp: Secondary | ICD-10-CM | POA: Diagnosis not present

## 2024-03-02 MED ORDER — TRAMADOL HCL 50 MG PO TABS
50.0000 mg | ORAL_TABLET | Freq: Four times a day (QID) | ORAL | 0 refills | Status: AC | PRN
  Filled 2024-03-02: qty 5, 2d supply, fill #0

## 2024-03-02 MED ORDER — IBUPROFEN 600 MG PO TABS
600.0000 mg | ORAL_TABLET | Freq: Four times a day (QID) | ORAL | 0 refills | Status: AC | PRN
  Filled 2024-03-02: qty 30, 8d supply, fill #0

## 2024-03-03 DIAGNOSIS — I10 Essential (primary) hypertension: Secondary | ICD-10-CM | POA: Diagnosis not present

## 2024-03-03 DIAGNOSIS — E119 Type 2 diabetes mellitus without complications: Secondary | ICD-10-CM | POA: Diagnosis not present

## 2024-03-03 DIAGNOSIS — G459 Transient cerebral ischemic attack, unspecified: Secondary | ICD-10-CM | POA: Diagnosis not present

## 2024-03-03 DIAGNOSIS — Z6841 Body Mass Index (BMI) 40.0 and over, adult: Secondary | ICD-10-CM | POA: Diagnosis not present

## 2024-03-03 DIAGNOSIS — E785 Hyperlipidemia, unspecified: Secondary | ICD-10-CM | POA: Diagnosis not present

## 2024-03-03 DIAGNOSIS — E66813 Obesity, class 3: Secondary | ICD-10-CM | POA: Diagnosis not present

## 2024-03-13 ENCOUNTER — Ambulatory Visit (INDEPENDENT_AMBULATORY_CARE_PROVIDER_SITE_OTHER)

## 2024-03-13 DIAGNOSIS — L501 Idiopathic urticaria: Secondary | ICD-10-CM | POA: Diagnosis not present

## 2024-03-14 ENCOUNTER — Other Ambulatory Visit (HOSPITAL_COMMUNITY): Payer: Self-pay

## 2024-03-14 ENCOUNTER — Other Ambulatory Visit: Payer: Self-pay

## 2024-03-27 ENCOUNTER — Other Ambulatory Visit (HOSPITAL_COMMUNITY): Payer: Self-pay

## 2024-03-27 ENCOUNTER — Other Ambulatory Visit: Payer: Self-pay

## 2024-03-30 ENCOUNTER — Other Ambulatory Visit: Payer: Self-pay

## 2024-03-30 NOTE — Progress Notes (Signed)
 Specialty Pharmacy Refill Coordination Note  Shelley Mccoy is a 53 y.o. female assessed today regarding refills of clinic administered specialty medication(s) Omalizumab  (XOLAIR )   Clinic requested Courier to Provider Office   Delivery date: 04/05/24   Verified address: [facility] - [address], [place]   Medication will be filled on 07.08.25.

## 2024-04-03 ENCOUNTER — Other Ambulatory Visit: Payer: Self-pay

## 2024-04-03 DIAGNOSIS — Z1231 Encounter for screening mammogram for malignant neoplasm of breast: Secondary | ICD-10-CM

## 2024-04-10 ENCOUNTER — Ambulatory Visit

## 2024-04-10 DIAGNOSIS — L501 Idiopathic urticaria: Secondary | ICD-10-CM

## 2024-04-12 ENCOUNTER — Encounter

## 2024-04-12 DIAGNOSIS — Z1231 Encounter for screening mammogram for malignant neoplasm of breast: Secondary | ICD-10-CM

## 2024-04-19 ENCOUNTER — Other Ambulatory Visit (HOSPITAL_COMMUNITY): Payer: Self-pay

## 2024-04-19 ENCOUNTER — Other Ambulatory Visit: Payer: Self-pay

## 2024-04-24 ENCOUNTER — Other Ambulatory Visit (HOSPITAL_COMMUNITY): Payer: Self-pay

## 2024-04-25 ENCOUNTER — Other Ambulatory Visit (HOSPITAL_COMMUNITY): Payer: Self-pay

## 2024-04-25 ENCOUNTER — Other Ambulatory Visit: Payer: Self-pay

## 2024-04-25 ENCOUNTER — Encounter (HOSPITAL_COMMUNITY): Payer: Self-pay

## 2024-04-28 ENCOUNTER — Other Ambulatory Visit (HOSPITAL_COMMUNITY): Payer: Self-pay

## 2024-04-30 ENCOUNTER — Other Ambulatory Visit (HOSPITAL_COMMUNITY): Payer: Self-pay

## 2024-04-30 MED ORDER — DEXCOM G7 SENSOR MISC
6 refills | Status: AC
  Filled 2024-04-30 – 2024-05-01 (×2): qty 3, 30d supply, fill #0
  Filled 2024-05-01: qty 1, 90d supply, fill #0
  Filled 2024-05-02 – 2024-05-08 (×2): qty 3, 30d supply, fill #0
  Filled 2024-06-04: qty 3, 30d supply, fill #1
  Filled 2024-07-04: qty 3, 30d supply, fill #2
  Filled 2024-07-04: qty 1, 10d supply, fill #2
  Filled 2024-08-07: qty 3, 30d supply, fill #2
  Filled 2024-08-24: qty 1, 10d supply, fill #2
  Filled 2024-08-29: qty 3, 30d supply, fill #2
  Filled 2024-09-06: qty 3, 30d supply, fill #0
  Filled 2024-09-06: qty 3, 30d supply, fill #2
  Filled 2024-09-08 – 2024-10-09 (×3): qty 3, 30d supply, fill #0
  Filled ????-??-??: fill #0

## 2024-04-30 MED ORDER — DEXCOM G7 RECEIVER DEVI
6 refills | Status: AC
  Filled 2024-04-30: qty 1, 90d supply, fill #0
  Filled 2024-07-03 – 2024-10-09 (×8): qty 1, 90d supply, fill #1

## 2024-05-01 ENCOUNTER — Other Ambulatory Visit: Payer: Self-pay

## 2024-05-01 ENCOUNTER — Other Ambulatory Visit (HOSPITAL_COMMUNITY): Payer: Self-pay

## 2024-05-01 NOTE — Progress Notes (Signed)
 Specialty Pharmacy Refill Coordination Note  Shelley Mccoy is a 53 y.o. female assessed today regarding refills of clinic administered specialty medication(s) Omalizumab  (XOLAIR )   Clinic requested Courier to Provider Office   Delivery date: 05/04/24   Verified address: [facility] - [address] [place], [place]   Medication will be filled on 05/03/24.

## 2024-05-02 ENCOUNTER — Other Ambulatory Visit: Payer: Self-pay

## 2024-05-02 ENCOUNTER — Encounter (HOSPITAL_COMMUNITY): Payer: Self-pay

## 2024-05-02 ENCOUNTER — Other Ambulatory Visit (HOSPITAL_COMMUNITY): Payer: Self-pay

## 2024-05-08 ENCOUNTER — Ambulatory Visit

## 2024-05-08 ENCOUNTER — Other Ambulatory Visit (HOSPITAL_COMMUNITY): Payer: Self-pay

## 2024-05-10 ENCOUNTER — Other Ambulatory Visit (HOSPITAL_COMMUNITY): Payer: Self-pay

## 2024-05-12 ENCOUNTER — Ambulatory Visit (INDEPENDENT_AMBULATORY_CARE_PROVIDER_SITE_OTHER): Admitting: *Deleted

## 2024-05-12 DIAGNOSIS — L501 Idiopathic urticaria: Secondary | ICD-10-CM | POA: Diagnosis not present

## 2024-05-15 ENCOUNTER — Encounter (HOSPITAL_COMMUNITY): Payer: Self-pay

## 2024-05-15 ENCOUNTER — Other Ambulatory Visit (HOSPITAL_COMMUNITY): Payer: Self-pay

## 2024-05-16 ENCOUNTER — Other Ambulatory Visit: Payer: Self-pay | Admitting: Allergy

## 2024-05-16 ENCOUNTER — Other Ambulatory Visit: Payer: Self-pay

## 2024-05-16 ENCOUNTER — Other Ambulatory Visit (HOSPITAL_COMMUNITY): Payer: Self-pay

## 2024-05-16 ENCOUNTER — Encounter (HOSPITAL_COMMUNITY): Payer: Self-pay

## 2024-05-16 MED ORDER — BUDESONIDE 0.5 MG/2ML IN SUSP
0.5000 mg | Freq: Two times a day (BID) | RESPIRATORY_TRACT | 0 refills | Status: DC
  Filled 2024-05-16: qty 120, 30d supply, fill #0

## 2024-05-24 ENCOUNTER — Other Ambulatory Visit: Payer: Self-pay

## 2024-05-24 ENCOUNTER — Other Ambulatory Visit: Payer: Self-pay | Admitting: Pharmacy Technician

## 2024-05-24 NOTE — Progress Notes (Signed)
 Specialty Pharmacy Refill Coordination Note  Shelley Mccoy is a 53 y.o. female assessed today regarding refills of clinic administered specialty medication(s) Omalizumab  (XOLAIR )   Clinic requested Courier to Provider Office   Delivery date: 06/06/24   Verified address: [facility] [address] [place], [place], [place] [address]   Medication will be filled on 06/05/24.  Injection date: 06/09/24. Clean Claim $0.00  Sent mychart message Clear bag.

## 2024-05-28 DIAGNOSIS — Z1339 Encounter for screening examination for other mental health and behavioral disorders: Secondary | ICD-10-CM | POA: Diagnosis not present

## 2024-05-28 DIAGNOSIS — Z1331 Encounter for screening for depression: Secondary | ICD-10-CM | POA: Diagnosis not present

## 2024-05-28 DIAGNOSIS — E109 Type 1 diabetes mellitus without complications: Secondary | ICD-10-CM | POA: Diagnosis not present

## 2024-05-28 DIAGNOSIS — Z1159 Encounter for screening for other viral diseases: Secondary | ICD-10-CM | POA: Diagnosis not present

## 2024-05-28 DIAGNOSIS — Z0000 Encounter for general adult medical examination without abnormal findings: Secondary | ICD-10-CM | POA: Diagnosis not present

## 2024-05-31 ENCOUNTER — Other Ambulatory Visit: Payer: Self-pay

## 2024-05-31 DIAGNOSIS — Z1231 Encounter for screening mammogram for malignant neoplasm of breast: Secondary | ICD-10-CM

## 2024-06-04 ENCOUNTER — Other Ambulatory Visit (HOSPITAL_COMMUNITY): Payer: Self-pay

## 2024-06-05 ENCOUNTER — Other Ambulatory Visit: Payer: Self-pay

## 2024-06-06 ENCOUNTER — Encounter

## 2024-06-06 ENCOUNTER — Other Ambulatory Visit: Payer: Self-pay

## 2024-06-06 ENCOUNTER — Other Ambulatory Visit (HOSPITAL_COMMUNITY): Payer: Self-pay

## 2024-06-06 DIAGNOSIS — Z1231 Encounter for screening mammogram for malignant neoplasm of breast: Secondary | ICD-10-CM

## 2024-06-06 MED ORDER — FLUZONE 0.5 ML IM SUSY
0.5000 mL | PREFILLED_SYRINGE | Freq: Once | INTRAMUSCULAR | 0 refills | Status: AC
  Filled 2024-06-06: qty 0.5, 1d supply, fill #0

## 2024-06-09 ENCOUNTER — Ambulatory Visit

## 2024-06-09 ENCOUNTER — Ambulatory Visit (INDEPENDENT_AMBULATORY_CARE_PROVIDER_SITE_OTHER)

## 2024-06-09 DIAGNOSIS — L501 Idiopathic urticaria: Secondary | ICD-10-CM | POA: Diagnosis not present

## 2024-06-12 ENCOUNTER — Ambulatory Visit
Admission: RE | Admit: 2024-06-12 | Discharge: 2024-06-12 | Disposition: A | Discharge status: Home / Self Care | Source: Ambulatory Visit

## 2024-06-12 DIAGNOSIS — Z1231 Encounter for screening mammogram for malignant neoplasm of breast: Secondary | ICD-10-CM

## 2024-06-15 ENCOUNTER — Other Ambulatory Visit: Payer: Self-pay

## 2024-06-15 ENCOUNTER — Other Ambulatory Visit: Payer: Self-pay | Admitting: Allergy

## 2024-06-15 ENCOUNTER — Other Ambulatory Visit (HOSPITAL_COMMUNITY): Payer: Self-pay

## 2024-06-15 DIAGNOSIS — R928 Other abnormal and inconclusive findings on diagnostic imaging of breast: Secondary | ICD-10-CM

## 2024-06-15 DIAGNOSIS — J4540 Moderate persistent asthma, uncomplicated: Secondary | ICD-10-CM

## 2024-06-15 MED ORDER — BUDESONIDE 0.5 MG/2ML IN SUSP
0.5000 mg | Freq: Two times a day (BID) | RESPIRATORY_TRACT | 0 refills | Status: DC
  Filled 2024-06-15 – 2024-06-27 (×2): qty 120, 30d supply, fill #0

## 2024-06-20 ENCOUNTER — Other Ambulatory Visit (HOSPITAL_COMMUNITY): Payer: Self-pay

## 2024-06-23 ENCOUNTER — Other Ambulatory Visit: Payer: Self-pay

## 2024-06-26 ENCOUNTER — Ambulatory Visit
Admission: RE | Admit: 2024-06-26 | Discharge: 2024-06-26 | Disposition: A | Discharge status: Home / Self Care | Source: Ambulatory Visit

## 2024-06-26 ENCOUNTER — Other Ambulatory Visit: Payer: Self-pay | Admitting: Pharmacy Technician

## 2024-06-26 ENCOUNTER — Other Ambulatory Visit (HOSPITAL_COMMUNITY): Payer: Self-pay

## 2024-06-26 ENCOUNTER — Ambulatory Visit: Admission: RE | Admit: 2024-06-26 | Discharge: 2024-06-26 | Disposition: A | Source: Ambulatory Visit

## 2024-06-26 ENCOUNTER — Other Ambulatory Visit: Payer: Self-pay

## 2024-06-26 DIAGNOSIS — N6002 Solitary cyst of left breast: Secondary | ICD-10-CM | POA: Diagnosis not present

## 2024-06-26 DIAGNOSIS — R928 Other abnormal and inconclusive findings on diagnostic imaging of breast: Secondary | ICD-10-CM

## 2024-06-26 NOTE — Progress Notes (Signed)
 Specialty Pharmacy Refill Coordination Note  Shelley Mccoy is a 53 y.o. female assessed today regarding refills of clinic administered specialty medication(s) Omalizumab  (XOLAIR )   Clinic requested Courier to Provider Office   Delivery date: 07/04/24   Verified address: [facility] [address] [place], [place] [address]   Medication will be filled on 07/03/24.

## 2024-06-27 ENCOUNTER — Other Ambulatory Visit (HOSPITAL_COMMUNITY): Payer: Self-pay

## 2024-07-03 ENCOUNTER — Other Ambulatory Visit (HOSPITAL_COMMUNITY): Payer: Self-pay

## 2024-07-04 ENCOUNTER — Encounter (HOSPITAL_COMMUNITY): Payer: Self-pay

## 2024-07-04 ENCOUNTER — Other Ambulatory Visit: Payer: Self-pay

## 2024-07-04 ENCOUNTER — Other Ambulatory Visit (HOSPITAL_COMMUNITY): Payer: Self-pay

## 2024-07-04 MED ORDER — DEXCOM G7 RECEIVER DEVI
6 refills | Status: AC
  Filled 2024-07-04: qty 1, 30d supply, fill #0
  Filled 2024-07-04 – 2024-08-07 (×3): qty 1, fill #0
  Filled 2024-08-24 – 2024-09-06 (×3): qty 1, 90d supply, fill #0
  Filled 2024-09-15 – 2024-10-09 (×2): qty 1, fill #0

## 2024-07-04 MED ORDER — DEXCOM G7 RECEIVER DEVI
1.0000 | Freq: Once | 0 refills | Status: AC
  Filled 2024-07-04: qty 1, 30d supply, fill #0
  Filled 2024-07-04 (×2): qty 1, fill #0

## 2024-07-04 MED ORDER — DEXCOM G7 SENSOR MISC
6 refills | Status: AC
  Filled 2024-07-04: qty 3, 30d supply, fill #0
  Filled 2024-08-07: qty 3, 30d supply, fill #1

## 2024-07-07 ENCOUNTER — Ambulatory Visit

## 2024-07-10 ENCOUNTER — Other Ambulatory Visit (HOSPITAL_COMMUNITY): Payer: Self-pay

## 2024-07-10 ENCOUNTER — Ambulatory Visit (INDEPENDENT_AMBULATORY_CARE_PROVIDER_SITE_OTHER)

## 2024-07-10 DIAGNOSIS — J4540 Moderate persistent asthma, uncomplicated: Secondary | ICD-10-CM

## 2024-07-10 DIAGNOSIS — L501 Idiopathic urticaria: Secondary | ICD-10-CM

## 2024-07-10 MED ORDER — ZOSTER VAC RECOMB ADJUVANTED 50 MCG/0.5ML IM SUSR
0.5000 mL | Freq: Once | INTRAMUSCULAR | 1 refills | Status: AC
  Filled 2024-07-10: qty 0.5, 1d supply, fill #0
  Filled 2024-10-06: qty 0.5, 1d supply, fill #1

## 2024-07-24 ENCOUNTER — Other Ambulatory Visit (HOSPITAL_COMMUNITY): Payer: Self-pay

## 2024-07-24 ENCOUNTER — Other Ambulatory Visit: Payer: Self-pay

## 2024-07-24 DIAGNOSIS — I10 Essential (primary) hypertension: Secondary | ICD-10-CM | POA: Diagnosis not present

## 2024-07-24 DIAGNOSIS — J4520 Mild intermittent asthma, uncomplicated: Secondary | ICD-10-CM | POA: Diagnosis not present

## 2024-07-24 DIAGNOSIS — E559 Vitamin D deficiency, unspecified: Secondary | ICD-10-CM | POA: Diagnosis not present

## 2024-07-24 DIAGNOSIS — F329 Major depressive disorder, single episode, unspecified: Secondary | ICD-10-CM | POA: Diagnosis not present

## 2024-07-24 DIAGNOSIS — R5383 Other fatigue: Secondary | ICD-10-CM | POA: Diagnosis not present

## 2024-07-24 DIAGNOSIS — E785 Hyperlipidemia, unspecified: Secondary | ICD-10-CM | POA: Diagnosis not present

## 2024-07-24 DIAGNOSIS — B009 Herpesviral infection, unspecified: Secondary | ICD-10-CM | POA: Diagnosis not present

## 2024-07-24 DIAGNOSIS — K219 Gastro-esophageal reflux disease without esophagitis: Secondary | ICD-10-CM | POA: Diagnosis not present

## 2024-07-24 DIAGNOSIS — R5381 Other malaise: Secondary | ICD-10-CM | POA: Diagnosis not present

## 2024-07-24 DIAGNOSIS — E119 Type 2 diabetes mellitus without complications: Secondary | ICD-10-CM | POA: Diagnosis not present

## 2024-07-24 DIAGNOSIS — E66813 Obesity, class 3: Secondary | ICD-10-CM | POA: Diagnosis not present

## 2024-07-24 DIAGNOSIS — E1159 Type 2 diabetes mellitus with other circulatory complications: Secondary | ICD-10-CM | POA: Diagnosis not present

## 2024-07-24 MED ORDER — VALACYCLOVIR HCL 1 G PO TABS: 1000.0000 mg | ORAL_TABLET | Freq: Every day | ORAL | 3 refills | Status: AC

## 2024-07-24 MED ORDER — ROSUVASTATIN CALCIUM 20 MG PO TABS
20.0000 mg | ORAL_TABLET | Freq: Every day | ORAL | 3 refills | Status: DC
  Filled 2024-07-24: qty 90, 90d supply, fill #0

## 2024-07-24 MED ORDER — VITAMIN D (ERGOCALCIFEROL) 50000 UNITS PO CAPS
50000.0000 [IU] | ORAL_CAPSULE | ORAL | 3 refills | Status: AC
  Filled 2024-07-24 – 2024-10-18 (×2): qty 13, 90d supply, fill #0

## 2024-07-24 MED ORDER — TRIAMCINOLONE ACETONIDE 0.1 % EX OINT
1.0000 | TOPICAL_OINTMENT | Freq: Every day | CUTANEOUS | 4 refills | Status: AC
  Filled 2024-07-24: qty 30, 30d supply, fill #0
  Filled 2024-08-24: qty 15, 15d supply, fill #1
  Filled 2024-09-15: qty 15, 15d supply, fill #2
  Filled 2024-10-09: qty 15, 15d supply, fill #3

## 2024-07-24 MED ORDER — SERTRALINE HCL 100 MG PO TABS
100.0000 mg | ORAL_TABLET | Freq: Every day | ORAL | 3 refills | Status: AC
  Filled 2024-10-18: qty 90, 90d supply, fill #0

## 2024-07-24 MED ORDER — LOSARTAN POTASSIUM 50 MG PO TABS
50.0000 mg | ORAL_TABLET | Freq: Every day | ORAL | 3 refills | Status: AC
  Filled 2024-07-24 – 2024-10-18 (×2): qty 90, 90d supply, fill #0

## 2024-07-24 MED ORDER — TRULICITY 0.75 MG/0.5ML ~~LOC~~ SOAJ
0.7500 mg | SUBCUTANEOUS | 3 refills | Status: AC
  Filled 2024-09-06 – 2024-09-15 (×3): qty 2, 28d supply, fill #0

## 2024-07-24 MED ORDER — CETIRIZINE HCL 10 MG PO TABS
10.0000 mg | ORAL_TABLET | Freq: Every day | ORAL | 3 refills | Status: DC
  Filled 2024-07-24: qty 90, 90d supply, fill #0

## 2024-07-24 MED ORDER — FREESTYLE LITE TEST VI STRP
1.0000 | ORAL_STRIP | Freq: Two times a day (BID) | 3 refills | Status: AC
  Filled 2024-07-24: qty 100, 50d supply, fill #0
  Filled 2024-09-15: qty 100, 50d supply, fill #1

## 2024-07-24 MED ORDER — HYDROCHLOROTHIAZIDE 12.5 MG PO TABS
12.5000 mg | ORAL_TABLET | Freq: Every day | ORAL | 3 refills | Status: AC
  Filled 2024-10-18: qty 90, 90d supply, fill #0

## 2024-07-24 MED ORDER — EPINEPHRINE 0.3 MG/0.3ML IJ SOAJ
0.3000 mg | INTRAMUSCULAR | 4 refills | Status: AC | PRN
  Filled 2024-07-24: qty 2, 30d supply, fill #0

## 2024-07-24 MED ORDER — FAMOTIDINE 20 MG PO TABS
20.0000 mg | ORAL_TABLET | Freq: Two times a day (BID) | ORAL | 3 refills | Status: AC
  Filled 2024-07-24 – 2024-10-18 (×2): qty 180, 90d supply, fill #0

## 2024-07-25 NOTE — Progress Notes (Unsigned)
 Follow Up Note  RE: Shelley Mccoy MRN: [id number] DOB: 10/30/1970 Date of Office Visit: 07/26/2024  Referring provider: Arleta Mom, * Primary care provider: Arleta Mom, MD  Chief Complaint: No chief complaint on file.  History of Present Illness: I had the pleasure of seeing Shelley Mccoy for a follow up visit at the [facility] on 07/26/2024. She is a 53 y.o. female, who is being followed for CIU on Xolair , asthma, allergic rhinoconjunctivitis. Her previous allergy  office visit was on 02/16/2024 with Dr. Luke. Today is a regular follow up visit.  Discussed the use of AI scribe software for clinical note transcription with the patient, who gave verbal consent to proceed.  History of Present Illness            ***  Assessment and Plan: Tashawnda is a 53 y.o. female with: Chronic idiopathic urticaria Past history - Breaking out in rash/hives for the past 3 years. Urticaria panel unremarkable. Unable to wean to every 5 weeks.  Interim history - no flares.  Xolair  injections 300mg  every 4 weeks.  Continue with zyrtec  10mg  daily - also for allergies.  Monitor symptoms.  Avoid the following potential triggers: alcohol, tight clothing, NSAIDs.    Moderate persistent asthma without complication Past history - 2020 spirometry showed: possible restrictive disease and 21% improvement in FEV1 post bronchodilator treatment.  Interim history - well controlled.  Today's spirometry showed some restriction. Daily controller medication(s): continue Trelegy 200mg  1 puff once a day and rinse mouth after each use.  During respiratory infections/flares:  Start budesonide  nebulizer twice a day for 1-2 weeks until your breathing symptoms return to baseline.  Pretreat with albuterol  2 puffs or albuterol  nebulizer.  If you need to use your albuterol  nebulizer machine back to back within 15-30 minutes with no relief then please go to the ER/urgent care for  further evaluation.  May use albuterol  rescue inhaler 2 puffs or nebulizer every 4 to 6 hours as needed for shortness of breath, chest tightness, coughing, and wheezing. May use albuterol  rescue inhaler 2 puffs 5 to 15 minutes prior to strenuous physical activities. Monitor frequency of use - if you need to use it more than twice per week on a consistent basis let us  know.    Seasonal allergic rhinitis due to pollen Allergic rhinitis due to dust mite Allergy  to cockroaches Allergic conjunctivitis of both eyes Past history - 2020 skin testing positive to ragweed, dust mites, molds and cockroaches. Fresh cut grass is a trigger.  Interim history - fresh cut grass is a trigger.  Continue environmental control measures. May use over the counter antihistamines such as Zyrtec  (cetirizine ), Claritin  (loratadine ), Allegra (fexofenadine), or Xyzal (levocetirizine) daily as needed. Use Flonase  (fluticasone ) nasal spray 1-2 sprays per nostril once a day as needed for nasal congestion.  Nasal saline spray (i.e., Simply Saline) or nasal saline lavage (i.e., NeilMed) is recommended as needed and prior to medicated nasal sprays.   Cyst on neck Cyst on right neck. No discomfort reported. Recommend dermatology evaluation for removal.  Assessment and Plan              No follow-ups on file.  No orders of the defined types were placed in this encounter.  Lab Orders  No laboratory test(s) ordered today    Diagnostics: Spirometry:  Tracings reviewed. Her effort: {Blank single:19197::Good reproducible efforts.,It was hard to get consistent efforts and there is a question as to whether this reflects a maximal maneuver.,Poor  effort, data can not be interpreted.} FVC: ***L FEV1: ***L, ***% predicted FEV1/FVC ratio: ***% Interpretation: {Blank single:19197::Spirometry consistent with mild obstructive disease,Spirometry consistent with moderate obstructive disease,Spirometry consistent with  severe obstructive disease,Spirometry consistent with possible restrictive disease,Spirometry consistent with mixed obstructive and restrictive disease,Spirometry uninterpretable due to technique,Spirometry consistent with normal pattern,No overt abnormalities noted given today's efforts}.  Please see scanned spirometry results for details.  Skin Testing: {Blank single:19197::Select foods,Environmental allergy  panel,Environmental allergy  panel and select foods,Food allergy  panel,None,Deferred due to recent antihistamines use}. *** Results discussed with patient/family.   Medication List:  Current Outpatient Medications  Medication Sig Dispense Refill   albuterol  (PROVENTIL ) (2.5 MG/3ML) 0.083% nebulizer solution Take 3 mLs (2.5 mg total) by nebulization every 4 (four) hours as needed for wheezing or shortness of breath (coughing fits). 75 mL 1   albuterol  (VENTOLIN  HFA) 108 (90 Base) MCG/ACT inhaler Inhale 2 puffs into the lungs every 4 (four) hours. 13.4 g 3   Ascorbic Acid (VITAMIN C PO) Take 1 tablet by mouth daily.     budesonide  (PULMICORT ) 0.5 MG/2ML nebulizer solution Inhale 2 mLs (0.5 mg) by nebulization in the morning and at bedtime. Take for 1-2 weeks during asthma flare. 120 mL 0   cetirizine  (ZYRTEC ) 10 MG tablet Take 1 tablet (10 mg total) by mouth daily. 90 tablet 3   cetirizine  (ZYRTEC ) 10 MG tablet Take 1 tablet (10 mg total) by mouth daily. 90 tablet 3   conjugated estrogens  (PREMARIN ) vaginal cream Place 1 Applicatorful vaginally daily. (Patient taking differently: Place 1 Application vaginally See admin instructions. Insert 1 Applicatorful into the vagina weekly for vaginal irritation) 30 g 1   Continuous Glucose Receiver (DEXCOM G7 RECEIVER) DEVI Use as directed to continuously monitor blood sugar. 1 each 6   Continuous Glucose Receiver (DEXCOM G7 RECEIVER) DEVI Use as directed. 1 each 6   Continuous Glucose Sensor (DEXCOM G7 SENSOR) MISC Use as directed  to continuously monitor blood sugar, changing the sensor every 10 days. 1 each 6   Continuous Glucose Sensor (DEXCOM G7 SENSOR) MISC Use as directed to continuously monitor blood sugar, changing the sensor every 10 days. 1 each 6   Continuous Glucose Sensor (DEXCOM G7 SENSOR) MISC Use as directed to continuously monitor blood sugar, changing the sensor every 10 days. 1 each 6   cyclobenzaprine  (FLEXERIL ) 10 MG tablet Take 1 tablet (10 mg total) by mouth 2 (two) times daily as needed for muscle spasms. 20 tablet 0   Dulaglutide  (TRULICITY ) 0.75 MG/0.5ML SOAJ Inject 0.75 mg into the skin once a week. 2 mL 3   Dulaglutide  (TRULICITY ) 0.75 MG/0.5ML SOAJ Inject 0.75 mg into the skin once a week. 2 mL 3   EPINEPHrine  0.3 mg/0.3 mL IJ SOAJ injection Use as directed as needed 2 each 4   EPINEPHrine  0.3 mg/0.3 mL IJ SOAJ injection Inject 0.3 mg into the muscle as needed. 2 each 4   estradiol  (ESTRACE ) 0.1 MG/GM vaginal cream Insert 1 gram vaginally daily for 14 days, for vaginal irritation 42.5 g 1   famotidine  (PEPCID ) 20 MG tablet Take 1 tablet (20 mg total) by mouth 2 (two) times daily. 180 tablet 3   famotidine  (PEPCID ) 20 MG tablet Take 1 tablet (20 mg total) by mouth 2 (two) times daily. 180 tablet 3   Ferrous Sulfate (IRON PO) Take 1 tablet by mouth daily.     fluticasone  (FLONASE ) 50 MCG/ACT nasal spray Place 2 sprays into both nostrils daily. 16 g 3   Fluticasone -Umeclidin-Vilant (TRELEGY  ELLIPTA) 200-62.5-25 MCG/ACT AEPB Inhale 1 puff into the lungs daily. Rinse mouth after each use. 60 each 5   gabapentin  (NEURONTIN ) 300 MG capsule Take 1 capsule (300 mg total) by mouth 3 (three) times daily. 270 capsule 3   glucose blood (FREESTYLE LITE) test strip Use 1 strip 2 (two) times daily. 150 strip 3   glucose blood (FREESTYLE LITE) test strip Use 1 strip twice a day to measure blood sugar as directed. 200 strip 3   hydrochlorothiazide  (HYDRODIURIL ) 12.5 MG tablet Take 1 tablet (12.5 mg total) by mouth  daily. 90 tablet 3   hydrochlorothiazide  (HYDRODIURIL ) 12.5 MG tablet Take 1 tablet (12.5 mg total) by mouth daily. 90 tablet 3   ibuprofen  (ADVIL ) 600 MG tablet Take 1 tablet (600 mg total) by mouth every 6 (six) hours as needed for mild pain (1-3) 30 tablet 0   lidocaine  (LIDODERM ) 5 % Place 1 patch onto the skin daily. Remove & Discard patch within 12 hours or as directed by MD 30 patch 0   losartan  (COZAAR ) 50 MG tablet Take 1 tablet (50 mg total) by mouth daily. 90 tablet 3   losartan  (COZAAR ) 50 MG tablet Take 1 tablet (50 mg total) by mouth daily. 90 tablet 3   omalizumab  (XOLAIR ) 150 MG/ML prefilled syringe Inject 300 mg into the skin every 28 (twenty-eight) days. 2 mL 11   Omega-3 Fatty Acids (FISH OIL) 1000 MG CAPS Take 1,000 mg by mouth daily.     rosuvastatin  (CRESTOR ) 20 MG tablet Take 1 tablet (20 mg total) by mouth daily. 90 tablet 3   rosuvastatin  (CRESTOR ) 20 MG tablet Take 1 tablet (20 mg total) by mouth daily. 90 tablet 3   sertraline  (ZOLOFT ) 100 MG tablet Take 1 tablet (100 mg total) by mouth daily. 90 tablet 3   sertraline  (ZOLOFT ) 100 MG tablet Take 1 tablet (100 mg total) by mouth daily. 90 tablet 3   traMADol  (ULTRAM ) 50 MG tablet Take 1 tablet (50 mg total) by mouth every 6 (six) hours as needed for severe pain (7-10). 5 tablet 0   triamcinolone  ointment (KENALOG ) 0.1 % Apply 1 Application topically daily to affected area. 30 g 4   triamcinolone  ointment (KENALOG ) 0.1 % Apply 1 Application (1 gram) topically daily. 30 g 4   valACYclovir  (VALTREX ) 1000 MG tablet Take 1 tablet (1,000 mg total) by mouth daily. 90 tablet 3   valACYclovir  (VALTREX ) 1000 MG tablet Take 1 tablet (1,000 mg total) by mouth daily. 90 tablet 3   Vitamin D , Ergocalciferol , 50000 units CAPS Take 1 capsule by mouth once a week. 13 capsule 3   Vitamin D , Ergocalciferol , 50000 units CAPS Take 1 capsule (50,000 Units total) by mouth once a week. 13 capsule 3   Current Facility-Administered Medications   Medication Dose Route Frequency Provider Last Rate Last Admin   omalizumab  (XOLAIR ) injection 300 mg  300 mg Subcutaneous Q28 days Iva Marty Saltness, MD   300 mg at 07/10/24 1056   Allergies: Allergies  Allergen Reactions   Percocet [Oxycodone -Acetaminophen ] Hives   I reviewed her past medical history, social history, family history, and environmental history and no significant changes have been reported from her previous visit.  Review of Systems  Constitutional:  Negative for appetite change, chills, fever and unexpected weight change.  HENT:  Negative for congestion.   Eyes:  Negative for itching.  Respiratory:  Negative for cough, chest tightness, shortness of breath and wheezing.   Cardiovascular:  Negative for chest  pain.  Gastrointestinal:  Negative for abdominal pain.  Genitourinary:  Negative for difficulty urinating.  Skin:  Negative for rash.  Allergic/Immunologic: Positive for environmental allergies. Negative for food allergies.    Objective: There were no vitals taken for this visit. There is no height or weight on file to calculate BMI. Physical Exam Vitals and nursing note reviewed.  Constitutional:      Appearance: Normal appearance. She is well-developed. She is obese.  HENT:     Head: Normocephalic and atraumatic.     Right Ear: Tympanic membrane and external ear normal.     Left Ear: Tympanic membrane and external ear normal.     Nose: Nose normal.     Mouth/Throat:     Mouth: Mucous membranes are moist.     Pharynx: Oropharynx is clear.  Eyes:     Conjunctiva/sclera: Conjunctivae normal.  Cardiovascular:     Rate and Rhythm: Normal rate and regular rhythm.     Heart sounds: Normal heart sounds. No murmur heard.    No friction rub. No gallop.  Pulmonary:     Effort: Pulmonary effort is normal.     Breath sounds: Normal breath sounds. No wheezing, rhonchi or rales.  Musculoskeletal:     Cervical back: Neck supple.  Skin:    General: Skin is  warm.     Findings: No rash.     Comments: Right neck - dime sized cyst noted.  Neurological:     Mental Status: She is alert and oriented to person, place, and time.  Psychiatric:        Behavior: Behavior normal.    Previous notes and tests were reviewed. The plan was reviewed with the patient/family, and all questions/concerned were addressed.  It was my pleasure to see Johan today and participate in her care. Please feel free to contact me with any questions or concerns.  Sincerely,  Orlan Cramp, DO Allergy  & Immunology  [facility]  [place] office: [phone] [facility] office: [phone]

## 2024-07-26 ENCOUNTER — Ambulatory Visit: Admitting: Allergy

## 2024-07-26 ENCOUNTER — Other Ambulatory Visit (HOSPITAL_COMMUNITY): Payer: Self-pay

## 2024-07-26 ENCOUNTER — Other Ambulatory Visit: Payer: Self-pay

## 2024-07-26 ENCOUNTER — Encounter: Payer: Self-pay | Admitting: Allergy

## 2024-07-26 VITALS — BP 138/80 | HR 63 | Temp 97.3°F | Ht 60.0 in | Wt 232.0 lb

## 2024-07-26 DIAGNOSIS — Z91038 Other insect allergy status: Secondary | ICD-10-CM | POA: Diagnosis not present

## 2024-07-26 DIAGNOSIS — J301 Allergic rhinitis due to pollen: Secondary | ICD-10-CM | POA: Diagnosis not present

## 2024-07-26 DIAGNOSIS — H1013 Acute atopic conjunctivitis, bilateral: Secondary | ICD-10-CM

## 2024-07-26 DIAGNOSIS — L501 Idiopathic urticaria: Secondary | ICD-10-CM | POA: Diagnosis not present

## 2024-07-26 DIAGNOSIS — R072 Precordial pain: Secondary | ICD-10-CM | POA: Insufficient documentation

## 2024-07-26 DIAGNOSIS — R0602 Shortness of breath: Secondary | ICD-10-CM | POA: Insufficient documentation

## 2024-07-26 DIAGNOSIS — J4540 Moderate persistent asthma, uncomplicated: Secondary | ICD-10-CM

## 2024-07-26 DIAGNOSIS — Z01810 Encounter for preprocedural cardiovascular examination: Secondary | ICD-10-CM | POA: Insufficient documentation

## 2024-07-26 DIAGNOSIS — J3089 Other allergic rhinitis: Secondary | ICD-10-CM

## 2024-07-26 DIAGNOSIS — E1165 Type 2 diabetes mellitus with hyperglycemia: Secondary | ICD-10-CM | POA: Insufficient documentation

## 2024-07-26 MED ORDER — CETIRIZINE HCL 10 MG PO TABS
10.0000 mg | ORAL_TABLET | Freq: Every day | ORAL | 3 refills | Status: AC
  Filled 2024-07-26 – 2024-10-18 (×2): qty 90, 90d supply, fill #0

## 2024-07-26 MED ORDER — TRELEGY ELLIPTA 200-62.5-25 MCG/ACT IN AEPB
1.0000 | INHALATION_SPRAY | Freq: Every day | RESPIRATORY_TRACT | 5 refills | Status: AC
  Filled 2024-07-26: qty 60, 30d supply, fill #0
  Filled 2024-08-29: qty 60, 30d supply, fill #1
  Filled 2024-10-09: qty 60, 30d supply, fill #2

## 2024-07-26 MED ORDER — ALBUTEROL SULFATE HFA 108 (90 BASE) MCG/ACT IN AERS
2.0000 | INHALATION_SPRAY | RESPIRATORY_TRACT | 1 refills | Status: AC | PRN
  Filled 2024-07-26: qty 6.7, 25d supply, fill #0

## 2024-07-26 MED ORDER — ALBUTEROL SULFATE (2.5 MG/3ML) 0.083% IN NEBU
2.5000 mg | INHALATION_SOLUTION | RESPIRATORY_TRACT | 2 refills | Status: AC | PRN
  Filled 2024-07-26: qty 75, 5d supply, fill #0

## 2024-07-26 NOTE — Patient Instructions (Addendum)
 Asthma  Normal breathing test.  Daily controller medication(s): continue Trelegy 200mg  1 puff once a day and rinse mouth after each use.  During respiratory infections/flares:  Start budesonide  nebulizer twice a day for 1-2 weeks until your breathing symptoms return to baseline.  Pretreat with albuterol  2 puffs or albuterol  nebulizer.  If you need to use your albuterol  nebulizer machine back to back within 15-30 minutes with no relief then please go to the ER/urgent care for further evaluation.  May use albuterol  rescue inhaler 2 puffs or nebulizer every 4 to 6 hours as needed for shortness of breath, chest tightness, coughing, and wheezing. May use albuterol  rescue inhaler 2 puffs 5 to 15 minutes prior to strenuous physical activities. Monitor frequency of use - if you need to use it more than twice per week on a consistent basis let us  know.  Breathing control goals:  Full participation in all desired activities (may need albuterol  before activity) Albuterol  use two times or less a week on average (not counting use with activity) Cough interfering with sleep two times or less a month Oral steroids no more than once a year No hospitalizations  Chronic urticaria Xolair  injections 300mg  every 4 weeks.  Continue with zyrtec  10mg  daily - also for allergies.  Monitor symptoms.  Avoid the following potential triggers: alcohol, tight clothing, NSAIDs.    Seasonal and perennial allergic rhinoconjunctivitis 2020 skin testing positive to ragweed and dust mites, molds and cockroaches. Continue environmental control measures. May use over the counter antihistamines such as Zyrtec  (cetirizine ), Claritin  (loratadine ), Allegra (fexofenadine), or Xyzal (levocetirizine) daily as needed. Use Flonase  (fluticasone ) nasal spray 1-2 sprays per nostril once a day as needed for nasal congestion.  Nasal saline spray (i.e., Simply Saline) or nasal saline lavage (i.e., NeilMed) is recommended as needed and prior to  medicated nasal sprays.  Follow up in 6 months or sooner if needed.

## 2024-07-27 ENCOUNTER — Other Ambulatory Visit (HOSPITAL_COMMUNITY): Payer: Self-pay

## 2024-07-27 MED ORDER — ROSUVASTATIN CALCIUM 40 MG PO TABS
40.0000 mg | ORAL_TABLET | Freq: Every day | ORAL | 3 refills | Status: AC
  Filled 2024-07-27: qty 30, 30d supply, fill #0
  Filled 2024-08-24: qty 30, 30d supply, fill #1
  Filled 2024-10-09: qty 30, 30d supply, fill #2
  Filled 2024-10-18: qty 30, 30d supply, fill #0

## 2024-07-28 ENCOUNTER — Other Ambulatory Visit (HOSPITAL_COMMUNITY): Payer: Self-pay

## 2024-07-31 ENCOUNTER — Other Ambulatory Visit: Payer: Self-pay

## 2024-07-31 ENCOUNTER — Other Ambulatory Visit (HOSPITAL_COMMUNITY): Payer: Self-pay

## 2024-07-31 NOTE — Progress Notes (Signed)
 Specialty Pharmacy Refill Coordination Note  In-office administered. Patient/Guardian authorizes monthly copay charge  Shelley Mccoy is a 53 y.o. female contacted today regarding refills of specialty medication(s) Omalizumab  (XOLAIR )  Doses on hand: 0   Injection appointment: 08/07/24  Patient requested: Courier to Provider Office   Delivery date: 08/02/24   Verified address: [facility] [address], [address] [place] [address]  Medication will be filled on 08/01/24

## 2024-08-07 ENCOUNTER — Ambulatory Visit: Admitting: Allergy

## 2024-08-07 ENCOUNTER — Other Ambulatory Visit (HOSPITAL_COMMUNITY): Payer: Self-pay

## 2024-08-07 ENCOUNTER — Other Ambulatory Visit: Payer: Self-pay

## 2024-08-07 ENCOUNTER — Ambulatory Visit (INDEPENDENT_AMBULATORY_CARE_PROVIDER_SITE_OTHER)

## 2024-08-07 ENCOUNTER — Other Ambulatory Visit: Payer: Self-pay | Admitting: Allergy

## 2024-08-07 DIAGNOSIS — J4540 Moderate persistent asthma, uncomplicated: Secondary | ICD-10-CM

## 2024-08-07 MED ORDER — BUDESONIDE 0.5 MG/2ML IN SUSP
0.5000 mg | Freq: Two times a day (BID) | RESPIRATORY_TRACT | 0 refills | Status: DC
  Filled 2024-08-07: qty 120, 30d supply, fill #0

## 2024-08-14 ENCOUNTER — Other Ambulatory Visit (HOSPITAL_COMMUNITY): Payer: Self-pay

## 2024-08-15 ENCOUNTER — Other Ambulatory Visit (HOSPITAL_COMMUNITY): Payer: Self-pay

## 2024-08-21 ENCOUNTER — Other Ambulatory Visit (HOSPITAL_COMMUNITY): Payer: Self-pay

## 2024-08-22 ENCOUNTER — Other Ambulatory Visit: Payer: Self-pay

## 2024-08-22 NOTE — Progress Notes (Signed)
 Specialty Pharmacy Refill Coordination Note  Shelley Mccoy is a 53 y.o. female assessed today regarding refills of clinic administered specialty medication(s) Omalizumab  (XOLAIR )   Clinic requested Courier to Provider Office   Delivery date: 08/30/24   Verified address: [facility] [address], [address] [place] [address]   Medication will be filled on: 08/29/24

## 2024-08-24 ENCOUNTER — Other Ambulatory Visit (HOSPITAL_COMMUNITY): Payer: Self-pay

## 2024-08-25 ENCOUNTER — Other Ambulatory Visit (HOSPITAL_COMMUNITY): Payer: Self-pay

## 2024-08-25 ENCOUNTER — Other Ambulatory Visit: Payer: Self-pay

## 2024-08-28 ENCOUNTER — Other Ambulatory Visit (HOSPITAL_COMMUNITY): Payer: Self-pay

## 2024-08-29 ENCOUNTER — Other Ambulatory Visit (HOSPITAL_COMMUNITY): Payer: Self-pay

## 2024-08-29 ENCOUNTER — Other Ambulatory Visit: Payer: Self-pay

## 2024-08-30 ENCOUNTER — Other Ambulatory Visit (HOSPITAL_COMMUNITY): Payer: Self-pay

## 2024-08-30 MED ORDER — DEXCOM G7 RECEIVER DEVI
6 refills | Status: AC
  Filled 2024-08-30: qty 1, 30d supply, fill #0

## 2024-08-30 MED ORDER — DEXCOM G7 SENSOR MISC: 0 refills | Status: DC

## 2024-08-30 MED ORDER — DEXCOM G7 SENSOR MISC
0 refills | Status: AC
  Filled 2024-08-30 – 2024-08-31 (×2): qty 1, 10d supply, fill #0
  Filled ????-??-??: fill #0

## 2024-08-31 ENCOUNTER — Other Ambulatory Visit (HOSPITAL_COMMUNITY): Payer: Self-pay

## 2024-09-01 ENCOUNTER — Other Ambulatory Visit (HOSPITAL_COMMUNITY): Payer: Self-pay

## 2024-09-04 ENCOUNTER — Ambulatory Visit

## 2024-09-04 DIAGNOSIS — L501 Idiopathic urticaria: Secondary | ICD-10-CM | POA: Diagnosis not present

## 2024-09-04 DIAGNOSIS — J4540 Moderate persistent asthma, uncomplicated: Secondary | ICD-10-CM

## 2024-09-05 ENCOUNTER — Other Ambulatory Visit (HOSPITAL_COMMUNITY): Payer: Self-pay

## 2024-09-05 MED ORDER — DEXCOM G7 SENSOR MISC
6 refills | Status: AC
  Filled 2024-09-05: qty 1, 10d supply, fill #0
  Filled 2024-09-08 (×2): qty 3, 30d supply, fill #0
  Filled 2024-10-09: qty 3, 30d supply, fill #1

## 2024-09-06 ENCOUNTER — Other Ambulatory Visit (HOSPITAL_COMMUNITY): Payer: Self-pay

## 2024-09-06 ENCOUNTER — Other Ambulatory Visit: Payer: Self-pay | Admitting: Allergy

## 2024-09-06 DIAGNOSIS — J4540 Moderate persistent asthma, uncomplicated: Secondary | ICD-10-CM

## 2024-09-07 ENCOUNTER — Other Ambulatory Visit (HOSPITAL_COMMUNITY): Payer: Self-pay

## 2024-09-07 ENCOUNTER — Other Ambulatory Visit: Payer: Self-pay

## 2024-09-07 MED ORDER — BUDESONIDE 0.5 MG/2ML IN SUSP
0.5000 mg | Freq: Two times a day (BID) | RESPIRATORY_TRACT | 0 refills | Status: DC
  Filled 2024-09-07: qty 120, 30d supply, fill #0

## 2024-09-08 ENCOUNTER — Other Ambulatory Visit: Payer: Self-pay

## 2024-09-08 ENCOUNTER — Other Ambulatory Visit (HOSPITAL_COMMUNITY): Payer: Self-pay

## 2024-09-11 ENCOUNTER — Other Ambulatory Visit: Payer: Self-pay

## 2024-09-11 ENCOUNTER — Other Ambulatory Visit (HOSPITAL_COMMUNITY): Payer: Self-pay

## 2024-09-13 ENCOUNTER — Other Ambulatory Visit (HOSPITAL_COMMUNITY): Payer: Self-pay

## 2024-09-16 ENCOUNTER — Other Ambulatory Visit (HOSPITAL_COMMUNITY): Payer: Self-pay

## 2024-09-18 ENCOUNTER — Other Ambulatory Visit (HOSPITAL_COMMUNITY): Payer: Self-pay

## 2024-09-18 ENCOUNTER — Other Ambulatory Visit: Payer: Self-pay

## 2024-09-19 ENCOUNTER — Other Ambulatory Visit: Payer: Self-pay

## 2024-09-19 NOTE — Progress Notes (Signed)
 Specialty Pharmacy Refill Coordination Note  Shelley Mccoy is a 53 y.o. female assessed today regarding refills of clinic administered specialty medication(s) Omalizumab  (XOLAIR )   Clinic requested Courier to Provider Office   Delivery date: 09/26/24   Verified address: [facility] [address], [address] [place] [address]   Medication will be filled on: 09/25/24

## 2024-09-25 ENCOUNTER — Other Ambulatory Visit: Payer: Self-pay

## 2024-10-02 ENCOUNTER — Ambulatory Visit

## 2024-10-02 DIAGNOSIS — J4540 Moderate persistent asthma, uncomplicated: Secondary | ICD-10-CM

## 2024-10-02 DIAGNOSIS — L508 Other urticaria: Secondary | ICD-10-CM | POA: Diagnosis not present

## 2024-10-06 ENCOUNTER — Other Ambulatory Visit: Payer: Self-pay

## 2024-10-06 ENCOUNTER — Other Ambulatory Visit (HOSPITAL_COMMUNITY): Payer: Self-pay

## 2024-10-09 ENCOUNTER — Other Ambulatory Visit: Payer: Self-pay

## 2024-10-09 ENCOUNTER — Other Ambulatory Visit: Payer: Self-pay | Admitting: Allergy

## 2024-10-09 ENCOUNTER — Other Ambulatory Visit (HOSPITAL_COMMUNITY): Payer: Self-pay

## 2024-10-09 DIAGNOSIS — J4540 Moderate persistent asthma, uncomplicated: Secondary | ICD-10-CM

## 2024-10-09 MED ORDER — BUDESONIDE 0.5 MG/2ML IN SUSP
0.5000 mg | Freq: Two times a day (BID) | RESPIRATORY_TRACT | 0 refills | Status: AC
  Filled 2024-10-09: qty 120, 30d supply, fill #0

## 2024-10-11 ENCOUNTER — Other Ambulatory Visit (HOSPITAL_COMMUNITY): Payer: Self-pay

## 2024-10-13 ENCOUNTER — Other Ambulatory Visit: Payer: Self-pay

## 2024-10-16 ENCOUNTER — Other Ambulatory Visit (HOSPITAL_COMMUNITY): Payer: Self-pay

## 2024-10-17 ENCOUNTER — Other Ambulatory Visit (HOSPITAL_COMMUNITY): Payer: Self-pay

## 2024-10-17 ENCOUNTER — Encounter: Payer: Self-pay | Admitting: Pharmacist

## 2024-10-17 MED ORDER — METHYLPREDNISOLONE 4 MG PO TBPK
ORAL_TABLET | ORAL | 0 refills | Status: AC
  Filled 2024-10-17: qty 21, 6d supply, fill #0

## 2024-10-18 ENCOUNTER — Other Ambulatory Visit (HOSPITAL_COMMUNITY): Payer: Self-pay

## 2024-10-18 ENCOUNTER — Other Ambulatory Visit: Payer: Self-pay

## 2024-10-18 MED ORDER — SHINGRIX 50 MCG/0.5ML IM SUSR
INTRAMUSCULAR | 0 refills | Status: AC
  Filled 2024-10-18: qty 0.5, 1d supply, fill #0

## 2024-10-18 NOTE — Progress Notes (Signed)
 Specialty Pharmacy Refill Coordination Note  SHANEESE TAIT is a 54 y.o. female contacted today regarding refills of specialty medication(s) Omalizumab  (XOLAIR )   Patient requested Courier to Provider Office   Delivery date: 10/27/24   Verified address: [facility] [address] [place] [address]   Medication will be filled on: 10/26/24

## 2024-10-19 ENCOUNTER — Other Ambulatory Visit: Payer: Self-pay

## 2024-10-19 ENCOUNTER — Other Ambulatory Visit (HOSPITAL_COMMUNITY): Payer: Self-pay

## 2024-10-20 ENCOUNTER — Other Ambulatory Visit (HOSPITAL_COMMUNITY): Payer: Self-pay

## 2024-10-20 ENCOUNTER — Other Ambulatory Visit: Payer: Self-pay

## 2024-10-20 ENCOUNTER — Encounter: Payer: Self-pay | Admitting: Pharmacist

## 2024-10-23 ENCOUNTER — Other Ambulatory Visit: Payer: Self-pay

## 2024-10-26 ENCOUNTER — Other Ambulatory Visit: Payer: Self-pay

## 2024-10-27 ENCOUNTER — Other Ambulatory Visit: Payer: Self-pay

## 2024-10-27 NOTE — Progress Notes (Signed)
 Specialty Pharmacy Ongoing Clinical Assessment Note  Shelley Mccoy is a 54 y.o. female who is being followed by the specialty pharmacy service for RxSp Allergy    Patient's specialty medication(s) reviewed today: Omalizumab  (XOLAIR )   Missed doses in the last 4 weeks: 0   Patient/Caregiver did not have any additional questions or concerns.   Therapeutic benefit summary: Patient is achieving benefit   Adverse events/side effects summary: No adverse events/side effects   Patient's therapy is appropriate to: Continue    Goals Addressed             This Visit's Progress    Reduce signs and symptoms   On track    Patient is on track. Patient will maintain adherence. Patient states she has had no allergic reactions recently         Follow up: 12 months  [facility]

## 2024-10-30 ENCOUNTER — Ambulatory Visit

## 2025-01-24 ENCOUNTER — Ambulatory Visit: Admitting: Allergy
